# Patient Record
Sex: Female | Born: 1935 | Race: Black or African American | Hispanic: No | Smoking: Former smoker
Health system: Southern US, Community
[De-identification: ages and names within clinical notes are randomized; demographics above are authoritative.]

## PROBLEM LIST (undated history)

## (undated) DIAGNOSIS — E785 Hyperlipidemia, unspecified: Secondary | ICD-10-CM

## (undated) DIAGNOSIS — F329 Major depressive disorder, single episode, unspecified: Secondary | ICD-10-CM

## (undated) DIAGNOSIS — M810 Age-related osteoporosis without current pathological fracture: Secondary | ICD-10-CM

## (undated) DIAGNOSIS — I493 Ventricular premature depolarization: Secondary | ICD-10-CM

## (undated) DIAGNOSIS — R413 Other amnesia: Secondary | ICD-10-CM

## (undated) DIAGNOSIS — I82409 Acute embolism and thrombosis of unspecified deep veins of unspecified lower extremity: Secondary | ICD-10-CM

## (undated) DIAGNOSIS — I10 Essential (primary) hypertension: Secondary | ICD-10-CM

## (undated) DIAGNOSIS — F32A Depression, unspecified: Secondary | ICD-10-CM

## (undated) HISTORY — DX: Ventricular premature depolarization: I49.3

## (undated) HISTORY — DX: Hyperlipidemia, unspecified: E78.5

## (undated) HISTORY — DX: Age-related osteoporosis without current pathological fracture: M81.0

## (undated) HISTORY — DX: Other amnesia: R41.3

## (undated) HISTORY — DX: Acute embolism and thrombosis of unspecified deep veins of unspecified lower extremity: I82.409

## (undated) HISTORY — DX: Depression, unspecified: F32.A

## (undated) HISTORY — PX: PARTIAL HYSTERECTOMY: SHX80

---
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
## 2001-02-01 ENCOUNTER — Emergency Department (HOSPITAL_COMMUNITY): Admission: EM | Admit: 2001-02-01 | Discharge: 2001-02-01 | Payer: Self-pay | Admitting: Emergency Medicine

## 2001-02-01 ENCOUNTER — Encounter: Payer: Self-pay | Admitting: Emergency Medicine

## 2001-10-30 ENCOUNTER — Encounter: Admission: RE | Admit: 2001-10-30 | Discharge: 2001-10-30 | Payer: Self-pay | Admitting: Internal Medicine

## 2005-09-09 ENCOUNTER — Emergency Department (HOSPITAL_COMMUNITY): Admission: EM | Admit: 2005-09-09 | Discharge: 2005-09-09 | Payer: Self-pay | Admitting: Emergency Medicine

## 2005-09-30 ENCOUNTER — Emergency Department (HOSPITAL_COMMUNITY): Admission: EM | Admit: 2005-09-30 | Discharge: 2005-09-30 | Payer: Self-pay | Admitting: Family Medicine

## 2006-04-08 ENCOUNTER — Emergency Department (HOSPITAL_COMMUNITY): Admission: EM | Admit: 2006-04-08 | Discharge: 2006-04-08 | Payer: Self-pay | Admitting: Family Medicine

## 2006-05-20 ENCOUNTER — Emergency Department (HOSPITAL_COMMUNITY): Admission: EM | Admit: 2006-05-20 | Discharge: 2006-05-20 | Payer: Self-pay | Admitting: Emergency Medicine

## 2007-03-26 ENCOUNTER — Emergency Department (HOSPITAL_COMMUNITY): Admission: EM | Admit: 2007-03-26 | Discharge: 2007-03-26 | Payer: Self-pay | Admitting: Family Medicine

## 2007-11-27 ENCOUNTER — Emergency Department (HOSPITAL_COMMUNITY): Admission: EM | Admit: 2007-11-27 | Discharge: 2007-11-27 | Payer: Self-pay | Admitting: Emergency Medicine

## 2008-01-04 ENCOUNTER — Emergency Department (HOSPITAL_COMMUNITY): Admission: EM | Admit: 2008-01-04 | Discharge: 2008-01-04 | Payer: Self-pay | Admitting: Emergency Medicine

## 2008-09-07 ENCOUNTER — Encounter: Admission: RE | Admit: 2008-09-07 | Discharge: 2008-09-07 | Payer: Self-pay | Admitting: Internal Medicine

## 2009-09-08 ENCOUNTER — Encounter: Admission: RE | Admit: 2009-09-08 | Discharge: 2009-09-08 | Payer: Self-pay | Admitting: Internal Medicine

## 2009-10-01 ENCOUNTER — Emergency Department (HOSPITAL_COMMUNITY): Admission: EM | Admit: 2009-10-01 | Discharge: 2009-10-01 | Payer: Self-pay | Admitting: Emergency Medicine

## 2010-03-12 ENCOUNTER — Emergency Department (HOSPITAL_COMMUNITY): Admission: EM | Admit: 2010-03-12 | Discharge: 2010-03-12 | Payer: Self-pay | Admitting: Family Medicine

## 2010-09-09 ENCOUNTER — Ambulatory Visit (HOSPITAL_COMMUNITY): Admission: RE | Admit: 2010-09-09 | Discharge: 2010-09-09 | Payer: Self-pay | Admitting: Obstetrics

## 2010-10-02 ENCOUNTER — Emergency Department (HOSPITAL_COMMUNITY): Admission: EM | Admit: 2010-10-02 | Discharge: 2010-10-02 | Payer: Self-pay | Admitting: Emergency Medicine

## 2011-01-31 LAB — POCT URINALYSIS DIPSTICK
Nitrite: NEGATIVE
Urobilinogen, UA: 0.2 mg/dL (ref 0.0–1.0)

## 2011-01-31 LAB — POCT I-STAT, CHEM 8
Calcium, Ion: 1.13 mmol/L (ref 1.12–1.32)
Chloride: 100 mEq/L (ref 96–112)
Creatinine, Ser: 0.9 mg/dL (ref 0.4–1.2)
Hemoglobin: 15.6 g/dL — ABNORMAL HIGH (ref 12.0–15.0)
Potassium: 3.8 mEq/L (ref 3.5–5.1)
Sodium: 139 mEq/L (ref 135–145)

## 2011-01-31 LAB — GAMMA GT: GGT: 26 U/L (ref 7–51)

## 2011-01-31 LAB — AST: AST: 24 U/L (ref 0–37)

## 2011-01-31 LAB — BILIRUBIN, TOTAL: Total Bilirubin: 0.5 mg/dL (ref 0.3–1.2)

## 2011-01-31 LAB — ALT: ALT: 16 U/L (ref 0–35)

## 2011-02-07 LAB — DIFFERENTIAL
Basophils Relative: 1 % (ref 0–1)
Lymphs Abs: 2.3 10*3/uL (ref 0.7–4.0)

## 2011-02-07 LAB — POCT I-STAT, CHEM 8
Calcium, Ion: 1.15 mmol/L (ref 1.12–1.32)
HCT: 45 % (ref 36.0–46.0)
Potassium: 3.9 mEq/L (ref 3.5–5.1)
TCO2: 27 mmol/L (ref 0–100)

## 2011-02-07 LAB — CBC
Hemoglobin: 13.8 g/dL (ref 12.0–15.0)
RBC: 4.38 MIL/uL (ref 3.87–5.11)

## 2011-02-22 LAB — BASIC METABOLIC PANEL
BUN: 8 mg/dL (ref 6–23)
CO2: 28 mEq/L (ref 19–32)
Calcium: 9.5 mg/dL (ref 8.4–10.5)
Creatinine, Ser: 0.71 mg/dL (ref 0.4–1.2)
GFR calc Af Amer: >60 mL/min (ref >60)
GFR calc non Af Amer: >60 mL/min (ref >60)
Glucose, Bld: 87 mg/dL (ref 70–99)

## 2011-02-22 LAB — CBC
HCT: 40.2 % (ref 36.0–46.0)
MCV: 94.4 fL (ref 78.0–100.0)
RBC: 4.26 MIL/uL (ref 3.87–5.11)
WBC: 5.5 10*3/uL (ref 4.0–10.5)

## 2011-02-22 LAB — URINALYSIS, ROUTINE W REFLEX MICROSCOPIC
Bilirubin Urine: NEGATIVE
Hgb urine dipstick: NEGATIVE
Ketones, ur: NEGATIVE mg/dL
Specific Gravity, Urine: 1.011 (ref 1.005–1.030)
Urobilinogen, UA: 0.2 mg/dL (ref 0.0–1.0)
pH: 7.5 (ref 5.0–8.0)

## 2011-02-22 LAB — DIFFERENTIAL
Lymphs Abs: 2.3 10*3/uL (ref 0.7–4.0)
Neutro Abs: 2.7 10*3/uL (ref 1.7–7.7)
Neutrophils Relative %: 48 % (ref 43–77)

## 2011-06-28 ENCOUNTER — Other Ambulatory Visit: Payer: Self-pay | Admitting: Obstetrics

## 2011-08-14 ENCOUNTER — Other Ambulatory Visit (HOSPITAL_COMMUNITY): Payer: Self-pay | Admitting: Obstetrics

## 2011-08-14 DIAGNOSIS — Z1231 Encounter for screening mammogram for malignant neoplasm of breast: Secondary | ICD-10-CM

## 2011-09-13 ENCOUNTER — Ambulatory Visit (HOSPITAL_COMMUNITY): Payer: Medicaid Other | Attending: Obstetrics

## 2011-09-13 ENCOUNTER — Other Ambulatory Visit: Payer: Self-pay | Admitting: Obstetrics

## 2011-10-25 ENCOUNTER — Encounter: Payer: Self-pay | Admitting: Emergency Medicine

## 2011-10-25 ENCOUNTER — Emergency Department (INDEPENDENT_AMBULATORY_CARE_PROVIDER_SITE_OTHER)
Admission: EM | Admit: 2011-10-25 | Discharge: 2011-10-25 | Disposition: A | Discharge status: Home / Self Care | Payer: Medicare Other | Source: Home / Self Care | Attending: Emergency Medicine | Admitting: Emergency Medicine

## 2011-10-25 ENCOUNTER — Emergency Department (INDEPENDENT_AMBULATORY_CARE_PROVIDER_SITE_OTHER): Payer: Medicare Other

## 2011-10-25 DIAGNOSIS — S6000XA Contusion of unspecified finger without damage to nail, initial encounter: Secondary | ICD-10-CM

## 2011-10-25 DIAGNOSIS — S60051A Contusion of right little finger without damage to nail, initial encounter: Secondary | ICD-10-CM

## 2011-10-25 HISTORY — DX: Essential (primary) hypertension: I10

## 2011-10-25 MED ORDER — TETANUS-DIPHTH-ACELL PERTUSSIS 5-2.5-18.5 LF-MCG/0.5 IM SUSP
INTRAMUSCULAR | Status: AC
  Filled 2011-10-25: qty 0.5

## 2011-10-25 MED ORDER — TETANUS-DIPHTH-ACELL PERTUSSIS 5-2.5-18.5 LF-MCG/0.5 IM SUSP
0.5000 mL | Freq: Once | INTRAMUSCULAR | Status: AC
  Administered 2011-10-25: 0.5 mL via INTRAMUSCULAR

## 2011-10-25 NOTE — ED Notes (Signed)
Pt here with right hand pinky finger 2cm superficial laceration that occurred yesterday after getting finger slammed in door.denies numb/tinglingc/o sore to the touch and swelling

## 2011-10-25 NOTE — ED Provider Notes (Signed)
History     CSN: [id number] Arrival date & time: 10/25/2011  6:59 PM   First MD Initiated Contact with Patient 10/25/11 1716      Chief Complaint  Patient presents with  . Finger Injury    (Consider location/radiation/quality/duration/timing/severity/associated sxs/prior treatment) Patient is a 77 y.o. female presenting with hand pain.  Hand Pain This is a new problem. The current episode started yesterday (caught right 5th finger in car door ). The problem has not changed since onset.She has tried nothing for the symptoms.    Past Medical History  Diagnosis Date  . Hypertension     History reviewed. No pertinent past surgical history.  No family history on file.  History  Substance Use Topics  . Smoking status: Never Smoker   . Smokeless tobacco: Not on file  . Alcohol Use: No    OB History    Grav Para Term Preterm Abortions TAB SAB Ect Mult Living                  Review of Systems  Constitutional: Negative.   Musculoskeletal: Positive for joint swelling.  Skin: Positive for wound.    Allergies  Review of patient's allergies indicates no known allergies.  Home Medications  No current outpatient prescriptions on file.  BP 215/101  Pulse 68  Temp(Src) 98.8 F (37.1 C) (Oral)  Resp 18  SpO2 98%  Physical Exam  Constitutional: She appears well-developed and well-nourished.  Musculoskeletal: She exhibits tenderness.       Arms:   ED Course  Procedures (including critical care time)  Labs Reviewed - No data to display No results found.   No diagnosis found.    MDM  X-rays reviewed and report per radiologist.         Barkley Bruns, MD 10/25/11 [phone]

## 2011-12-19 ENCOUNTER — Emergency Department (INDEPENDENT_AMBULATORY_CARE_PROVIDER_SITE_OTHER)
Admission: EM | Admit: 2011-12-19 | Discharge: 2011-12-19 | Disposition: A | Discharge status: Home / Self Care | Payer: Medicare Other | Source: Home / Self Care | Attending: Family Medicine | Admitting: Family Medicine

## 2011-12-19 ENCOUNTER — Encounter (HOSPITAL_COMMUNITY): Payer: Self-pay | Admitting: Emergency Medicine

## 2011-12-19 DIAGNOSIS — I10 Essential (primary) hypertension: Secondary | ICD-10-CM | POA: Diagnosis not present

## 2011-12-19 MED ORDER — LISINOPRIL 20 MG PO TABS: 10.0000 mg | ORAL_TABLET | Freq: Every day | ORAL | Status: DC

## 2011-12-19 NOTE — ED Provider Notes (Signed)
History     CSN: [id number]  Arrival date & time 12/19/11  1629   First MD Initiated Contact with Patient 12/19/11 1706      Chief Complaint  Patient presents with  . Hypertension    (Consider location/radiation/quality/duration/timing/severity/associated sxs/prior treatment) HPI Comments: The patient reports she has known hypertension and has not been taking her medication because she ran out 2 days ago.  Today she checked her blood pressure at the drug store and found that it was 178/100.  States she feels the same today and she always does - states she has some mild chest discomfort that is constant and exacerbated by palpation.  States she has daily SOB that is unchanged.  The CP and SOB occur only at rest and are not exertional.  Are the same every day. Denies headache.  States she did not get her medication refilled because she doesn't like the way that it makes her feel - states she feels like her whole body is numb and like her blood is not pumping.  Her doctor has referred her to a new doctor to start her on a new medication.  Previously on lisinopril-HCTZ   Patient is a 78 y.o. female presenting with hypertension. The history is provided by the patient.  Hypertension    Past Medical History  Diagnosis Date  . Hypertension     No past surgical history on file.  No family history on file.  History  Substance Use Topics  . Smoking status: Never Smoker   . Smokeless tobacco: Not on file  . Alcohol Use: No    OB History    Grav Para Term Preterm Abortions TAB SAB Ect Mult Living                  Review of Systems  All other systems reviewed and are negative.    Allergies  Review of patient's allergies indicates no known allergies.  Home Medications   Current Outpatient Rx  Name Route Sig Dispense Refill  . LISINOPRIL-HYDROCHLOROTHIAZIDE 10-12.5 MG PO TABS Oral Take 1 tablet by mouth daily.      BP 148/83  Pulse 88  Temp(Src) 97.9 F (36.6 C) (Oral)   Resp 18  SpO2 98%  Physical Exam  Nursing note and vitals reviewed. Constitutional: She is oriented to person, place, and time. She appears well-developed and well-nourished. No distress.  HENT:  Head: Normocephalic and atraumatic.  Neck: Neck supple.  Cardiovascular: Normal rate, regular rhythm and normal heart sounds.   No murmur heard. Pulmonary/Chest: Effort normal and breath sounds normal. No respiratory distress. She has no wheezes. She has no rales. She exhibits no tenderness.  Abdominal: Soft. Bowel sounds are normal. She exhibits no distension. There is no tenderness. There is no rebound and no guarding.  Musculoskeletal: Normal range of motion.  Neurological: She is alert and oriented to person, place, and time. She has normal strength. No cranial nerve deficit or sensory deficit. She exhibits normal muscle tone. Coordination and gait normal. GCS eye subscore is 4. GCS verbal subscore is 5. GCS motor subscore is 6.       CN II-XII intact, EOMs intact, no pronator drift, grip strengths equal, strength 5/5 in all extremities, sensation intact, gait is normal.    Skin: She is not diaphoretic.    ED Course  Procedures (including critical care time)  Labs Reviewed - No data to display No results found.   1. Hypertension       MDM  Patient with known hypertension, stopped her medication 2 days ago both because she ran out and because she didn't like the way it made her feel.  Today checked her blood pressure as she normally does at the drug store and saw that it was high - came to urgent care at PCP's direction because she was concerned about it being high.  BP here is 144/86.  Patient seems to have some chronic daily chest soreness and shortness of breath that are not different today.  I doubt ACS because the chest tenderness is reproducible by palpation, occurs only at rest, is not exacerbated by exertion, and is unchanged from her normal chronic daily discomfort.  The shortness  of breath also only occurs at rest and is a daily occurrence - patient currently denies SOB.  Patient has no neurological deficits and no altered mental status.  I do not believe she is having a hypertensive crisis.  I discussed the medications with the patient at length - she does not want to fill her old prescription but agrees instead to fill my prescription for one medication instead of the combination to see if she tolerates it.  Patient has multiple doctors that she follows with, organized by Dr Francoise Ceo - patient states she has appointments in progress to follow up for her HTN.          Dillard Cannon [place], [place] 12/19/11 1857

## 2011-12-19 NOTE — ED Notes (Signed)
Reports taking blood pressure while at [facility] drug store with a blood pressure reading of 178/100.  Patient reports feeling generalized weakness, intermittent, sometimes experiences some sob.  Patient reports these symptoms for 6-7 months.  Patient reports having had the flu  approximately 2 months ago.  Patient concerned with fluctuation of blood pressure.

## 2011-12-20 NOTE — ED Provider Notes (Signed)
Medical screening examination/treatment/procedure(s) were performed by non-physician practitioner and as supervising physician I was immediately available for consultation/collaboration.   [facility]; MD   Sharin Grave, MD 12/20/11 [phone]

## 2011-12-25 DIAGNOSIS — R0989 Other specified symptoms and signs involving the circulatory and respiratory systems: Secondary | ICD-10-CM | POA: Diagnosis not present

## 2011-12-25 DIAGNOSIS — I10 Essential (primary) hypertension: Secondary | ICD-10-CM | POA: Diagnosis not present

## 2011-12-25 DIAGNOSIS — R079 Chest pain, unspecified: Secondary | ICD-10-CM | POA: Diagnosis not present

## 2011-12-25 DIAGNOSIS — R9431 Abnormal electrocardiogram [ECG] [EKG]: Secondary | ICD-10-CM | POA: Diagnosis not present

## 2012-01-04 DIAGNOSIS — I10 Essential (primary) hypertension: Secondary | ICD-10-CM | POA: Diagnosis not present

## 2012-01-04 DIAGNOSIS — R0609 Other forms of dyspnea: Secondary | ICD-10-CM | POA: Diagnosis not present

## 2012-01-04 DIAGNOSIS — R9431 Abnormal electrocardiogram [ECG] [EKG]: Secondary | ICD-10-CM | POA: Diagnosis not present

## 2012-01-04 DIAGNOSIS — R079 Chest pain, unspecified: Secondary | ICD-10-CM | POA: Diagnosis not present

## 2012-01-05 ENCOUNTER — Encounter (INDEPENDENT_AMBULATORY_CARE_PROVIDER_SITE_OTHER): Payer: Medicare Other | Admitting: Ophthalmology

## 2012-01-05 DIAGNOSIS — H2510 Age-related nuclear cataract, unspecified eye: Secondary | ICD-10-CM | POA: Diagnosis not present

## 2012-01-05 DIAGNOSIS — H43819 Vitreous degeneration, unspecified eye: Secondary | ICD-10-CM | POA: Diagnosis not present

## 2012-01-05 DIAGNOSIS — H35039 Hypertensive retinopathy, unspecified eye: Secondary | ICD-10-CM | POA: Diagnosis not present

## 2012-01-05 DIAGNOSIS — I10 Essential (primary) hypertension: Secondary | ICD-10-CM

## 2012-01-10 DIAGNOSIS — R0989 Other specified symptoms and signs involving the circulatory and respiratory systems: Secondary | ICD-10-CM | POA: Diagnosis not present

## 2012-01-10 DIAGNOSIS — R9431 Abnormal electrocardiogram [ECG] [EKG]: Secondary | ICD-10-CM | POA: Diagnosis not present

## 2012-01-10 DIAGNOSIS — H53129 Transient visual loss, unspecified eye: Secondary | ICD-10-CM | POA: Diagnosis not present

## 2012-01-10 DIAGNOSIS — I10 Essential (primary) hypertension: Secondary | ICD-10-CM | POA: Diagnosis not present

## 2012-01-10 DIAGNOSIS — R0602 Shortness of breath: Secondary | ICD-10-CM | POA: Diagnosis not present

## 2012-01-10 DIAGNOSIS — R079 Chest pain, unspecified: Secondary | ICD-10-CM | POA: Diagnosis not present

## 2012-01-10 DIAGNOSIS — R0609 Other forms of dyspnea: Secondary | ICD-10-CM | POA: Diagnosis not present

## 2012-01-18 DIAGNOSIS — R0602 Shortness of breath: Secondary | ICD-10-CM | POA: Diagnosis not present

## 2012-01-18 DIAGNOSIS — R079 Chest pain, unspecified: Secondary | ICD-10-CM | POA: Diagnosis not present

## 2012-01-18 DIAGNOSIS — I10 Essential (primary) hypertension: Secondary | ICD-10-CM | POA: Diagnosis not present

## 2012-01-26 DIAGNOSIS — R9431 Abnormal electrocardiogram [ECG] [EKG]: Secondary | ICD-10-CM | POA: Diagnosis not present

## 2012-01-26 DIAGNOSIS — I10 Essential (primary) hypertension: Secondary | ICD-10-CM | POA: Diagnosis not present

## 2012-01-26 DIAGNOSIS — R079 Chest pain, unspecified: Secondary | ICD-10-CM | POA: Diagnosis not present

## 2012-02-01 DIAGNOSIS — R9431 Abnormal electrocardiogram [ECG] [EKG]: Secondary | ICD-10-CM | POA: Diagnosis not present

## 2012-02-01 DIAGNOSIS — I10 Essential (primary) hypertension: Secondary | ICD-10-CM | POA: Diagnosis not present

## 2012-02-01 DIAGNOSIS — R0609 Other forms of dyspnea: Secondary | ICD-10-CM | POA: Diagnosis not present

## 2012-02-01 DIAGNOSIS — R079 Chest pain, unspecified: Secondary | ICD-10-CM | POA: Diagnosis not present

## 2012-02-13 DIAGNOSIS — I10 Essential (primary) hypertension: Secondary | ICD-10-CM | POA: Diagnosis not present

## 2012-08-15 DIAGNOSIS — I10 Essential (primary) hypertension: Secondary | ICD-10-CM | POA: Diagnosis not present

## 2012-08-15 DIAGNOSIS — R634 Abnormal weight loss: Secondary | ICD-10-CM | POA: Diagnosis not present

## 2012-10-16 DIAGNOSIS — R0989 Other specified symptoms and signs involving the circulatory and respiratory systems: Secondary | ICD-10-CM | POA: Diagnosis not present

## 2012-10-16 DIAGNOSIS — E7800 Pure hypercholesterolemia, unspecified: Secondary | ICD-10-CM | POA: Diagnosis not present

## 2012-10-16 DIAGNOSIS — I10 Essential (primary) hypertension: Secondary | ICD-10-CM | POA: Diagnosis not present

## 2012-10-23 DIAGNOSIS — I10 Essential (primary) hypertension: Secondary | ICD-10-CM | POA: Diagnosis not present

## 2012-10-29 DIAGNOSIS — I10 Essential (primary) hypertension: Secondary | ICD-10-CM | POA: Diagnosis not present

## 2012-11-05 DIAGNOSIS — F0390 Unspecified dementia without behavioral disturbance: Secondary | ICD-10-CM | POA: Diagnosis not present

## 2012-11-05 DIAGNOSIS — I10 Essential (primary) hypertension: Secondary | ICD-10-CM | POA: Diagnosis not present

## 2013-02-26 DIAGNOSIS — I10 Essential (primary) hypertension: Secondary | ICD-10-CM | POA: Diagnosis not present

## 2013-02-26 DIAGNOSIS — E7800 Pure hypercholesterolemia, unspecified: Secondary | ICD-10-CM | POA: Diagnosis not present

## 2013-02-28 DIAGNOSIS — F0390 Unspecified dementia without behavioral disturbance: Secondary | ICD-10-CM | POA: Diagnosis not present

## 2013-02-28 DIAGNOSIS — I10 Essential (primary) hypertension: Secondary | ICD-10-CM | POA: Diagnosis not present

## 2013-05-06 DIAGNOSIS — I10 Essential (primary) hypertension: Secondary | ICD-10-CM | POA: Diagnosis not present

## 2013-07-27 ENCOUNTER — Emergency Department (INDEPENDENT_AMBULATORY_CARE_PROVIDER_SITE_OTHER)
Admission: EM | Admit: 2013-07-27 | Discharge: 2013-07-27 | Disposition: A | Discharge status: Home / Self Care | Payer: Medicare Other | Source: Home / Self Care | Attending: Family Medicine | Admitting: Family Medicine

## 2013-07-27 ENCOUNTER — Encounter (HOSPITAL_COMMUNITY): Payer: Self-pay | Admitting: *Deleted

## 2013-07-27 DIAGNOSIS — R040 Epistaxis: Secondary | ICD-10-CM

## 2013-07-27 DIAGNOSIS — I10 Essential (primary) hypertension: Secondary | ICD-10-CM

## 2013-07-27 LAB — POCT I-STAT, CHEM 8
BUN: 10 mg/dL (ref 6–23)
Chloride: 107 mEq/L (ref 96–112)
Potassium: 3.8 mEq/L (ref 3.5–5.1)
Sodium: 141 mEq/L (ref 135–145)
TCO2: 22 mmol/L (ref 0–100)

## 2013-07-27 NOTE — ED Provider Notes (Signed)
CSN: [id number]     Arrival date & time 07/27/13  4098 History   First MD Initiated Contact with Patient 07/27/13 [phone]     Chief Complaint  Patient presents with  . Dizziness  . Epistaxis  . Headache   (Consider location/radiation/quality/duration/timing/severity/associated sxs/prior Treatment) Patient is a 79 y.o. female presenting with nosebleeds. The history is provided by the patient and a relative.  Epistaxis Location:  L nare Severity:  Mild Duration:  1 day Timing:  Intermittent Progression:  Improving Chronicity:  New Context: aspirin use   Associated symptoms: congestion, dizziness and headaches   Associated symptoms comment:  Weakness for 1 month.   Past Medical History  Diagnosis Date  . Hypertension    History reviewed. No pertinent past surgical history. No family history on file. History  Substance Use Topics  . Smoking status: Former Games developer  . Smokeless tobacco: Not on file  . Alcohol Use: No   OB History   Grav Para Term Preterm Abortions TAB SAB Ect Mult Living                 Review of Systems  Constitutional: Negative.   HENT: Positive for nosebleeds, congestion and rhinorrhea.   Respiratory: Negative.   Cardiovascular: Negative.   Neurological: Positive for dizziness and headaches.    Allergies  Review of patient's allergies indicates no known allergies.  Home Medications   Current Outpatient Rx  Name  Route  Sig  Dispense  Refill  . amLODipine (NORVASC) 10 MG tablet   Oral   Take 10 mg by mouth daily.         Marland Kitchen aspirin 81 MG tablet   Oral   Take 81 mg by mouth daily.         Marland Kitchen ibuprofen (ADVIL,MOTRIN) 800 MG tablet   Oral   Take 800 mg by mouth every 8 (eight) hours as needed for pain.         . valsartan-hydrochlorothiazide (DIOVAN-HCT) 320-12.5 MG per tablet   Oral   Take 1 tablet by mouth daily.         Marland Kitchen EXPIRED: lisinopril (PRINIVIL,ZESTRIL) 20 MG tablet   Oral   Take 0.5 tablets (10 mg total) by mouth daily.  30 tablet   0   . lisinopril-hydrochlorothiazide (PRINZIDE,ZESTORETIC) 10-12.5 MG per tablet   Oral   Take 1 tablet by mouth daily.          BP 161/92  Pulse 83  Temp(Src) 98.6 F (37 C) (Oral)  Resp 17  SpO2 95% Physical Exam  Nursing note and vitals reviewed. Constitutional: She is oriented to person, place, and time. She appears well-developed and well-nourished.  HENT:  Head: Normocephalic.  Right Ear: External ear normal.  Left Ear: External ear normal.  Nose: Epistaxis is observed.    Mouth/Throat: Oropharynx is clear and moist.  Eyes: Pupils are equal, round, and reactive to light.  Neck: Normal range of motion. Neck supple.  Cardiovascular: Normal rate.   Pulmonary/Chest: Effort normal and breath sounds normal.  Musculoskeletal: She exhibits no edema.  Neurological: She is alert and oriented to person, place, and time.  Skin: Skin is warm and dry.    ED Course  Procedures (including critical care time) Labs Review Labs Reviewed  POCT I-STAT, CHEM 8   Imaging Review No results found.  MDM  Afrin and bacitracin applied to left nares.    Linna Hoff, MD 07/27/13 1022

## 2013-07-27 NOTE — ED Notes (Addendum)
Triage assessment performed per Dr. Artis Flock.  States has not taken HTN meds today.

## 2013-08-06 DIAGNOSIS — I10 Essential (primary) hypertension: Secondary | ICD-10-CM | POA: Diagnosis not present

## 2013-08-06 DIAGNOSIS — Z23 Encounter for immunization: Secondary | ICD-10-CM | POA: Diagnosis not present

## 2013-08-13 ENCOUNTER — Emergency Department (INDEPENDENT_AMBULATORY_CARE_PROVIDER_SITE_OTHER)
Admission: EM | Admit: 2013-08-13 | Discharge: 2013-08-13 | Disposition: A | Discharge status: Home / Self Care | Payer: Medicare Other | Source: Home / Self Care | Attending: Family Medicine | Admitting: Family Medicine

## 2013-08-13 ENCOUNTER — Encounter (HOSPITAL_COMMUNITY): Payer: Self-pay | Admitting: Emergency Medicine

## 2013-08-13 DIAGNOSIS — R5381 Other malaise: Secondary | ICD-10-CM | POA: Diagnosis not present

## 2013-08-13 DIAGNOSIS — R531 Weakness: Secondary | ICD-10-CM

## 2013-08-13 NOTE — ED Notes (Signed)
Pt c/o headache onset this morning. Pt reports she had recent dental surgery and doesn't know if pain was related to that. Has not taken any pain meds today. Reports when she takes them she feels dizzy and like she is drunk. Pt is alert and oriented.

## 2013-08-13 NOTE — ED Provider Notes (Signed)
CSN: [id number]     Arrival date & time 08/13/13  1515 History   First MD Initiated Contact with Patient 08/13/13 1539     Chief Complaint  Patient presents with  . Headache   (Consider location/radiation/quality/duration/timing/severity/associated sxs/prior Treatment) Patient is a 79 y.o. female presenting with headaches. The history is provided by the patient.  Headache Pain location:  Generalized Radiates to:  Does not radiate Progression:  Unchanged Chronicity:  New Similar to prior headaches: no   Context comment:  Post dental extractions, taking abx and pain med, causing side effect. Associated symptoms: no fever     Past Medical History  Diagnosis Date  . Hypertension    History reviewed. No pertinent past surgical history. No family history on file. History  Substance Use Topics  . Smoking status: Former Games developer  . Smokeless tobacco: Not on file  . Alcohol Use: No   OB History   Grav Para Term Preterm Abortions TAB SAB Ect Mult Living                 Review of Systems  Constitutional: Positive for appetite change. Negative for fever and chills.  Respiratory: Negative for chest tightness and shortness of breath.   Gastrointestinal: Negative.   Neurological: Positive for headaches.    Allergies  Review of patient's allergies indicates no known allergies.  Home Medications   Current Outpatient Rx  Name  Route  Sig  Dispense  Refill  . amLODipine (NORVASC) 10 MG tablet   Oral   Take 10 mg by mouth daily.         Marland Kitchen aspirin 81 MG tablet   Oral   Take 81 mg by mouth daily.         Marland Kitchen ibuprofen (ADVIL,MOTRIN) 800 MG tablet   Oral   Take 800 mg by mouth every 8 (eight) hours as needed for pain.         Marland Kitchen EXPIRED: lisinopril (PRINIVIL,ZESTRIL) 20 MG tablet   Oral   Take 0.5 tablets (10 mg total) by mouth daily.   30 tablet   0   . lisinopril-hydrochlorothiazide (PRINZIDE,ZESTORETIC) 10-12.5 MG per tablet   Oral   Take 1 tablet by mouth daily.        . valsartan-hydrochlorothiazide (DIOVAN-HCT) 320-12.5 MG per tablet   Oral   Take 1 tablet by mouth daily.          BP 121/75  Pulse 74  Temp(Src) 98.4 F (36.9 C) (Oral)  Resp 20  SpO2 97% Physical Exam  Nursing note and vitals reviewed. Constitutional: She is oriented to person, place, and time. She appears well-developed and well-nourished.  HENT:  Right Ear: External ear normal.  Left Ear: External ear normal.  Mouth/Throat: Oropharynx is clear and moist.  Healing dental gingiva extraction sites.  Eyes: EOM are normal. Pupils are equal, round, and reactive to light.  Neck: Normal range of motion. Neck supple.  Cardiovascular: Normal rate, regular rhythm and intact distal pulses.   Pulmonary/Chest: Effort normal and breath sounds normal.  Musculoskeletal: She exhibits no edema.  Neurological: She is alert and oriented to person, place, and time.  Skin: Skin is warm and dry.    ED Course  Procedures (including critical care time) Labs Review Labs Reviewed - No data to display Imaging Review No results found.  MDM   1. Weakness generalized      Linna Hoff, MD 08/13/13 [phone]

## 2013-08-22 ENCOUNTER — Emergency Department (INDEPENDENT_AMBULATORY_CARE_PROVIDER_SITE_OTHER)
Admission: EM | Admit: 2013-08-22 | Discharge: 2013-08-22 | Disposition: A | Discharge status: Home / Self Care | Payer: Medicare Other | Source: Home / Self Care

## 2013-08-22 ENCOUNTER — Encounter (HOSPITAL_COMMUNITY): Payer: Self-pay

## 2013-08-22 DIAGNOSIS — R638 Other symptoms and signs concerning food and fluid intake: Secondary | ICD-10-CM

## 2013-08-22 DIAGNOSIS — R42 Dizziness and giddiness: Secondary | ICD-10-CM | POA: Diagnosis not present

## 2013-08-22 DIAGNOSIS — K5900 Constipation, unspecified: Secondary | ICD-10-CM

## 2013-08-22 LAB — POCT I-STAT, CHEM 8
BUN: 20 mg/dL (ref 6–23)
Creatinine, Ser: 1.1 mg/dL (ref 0.50–1.10)
Hemoglobin: 13.9 g/dL (ref 12.0–15.0)
Potassium: 4 mEq/L (ref 3.5–5.1)
Sodium: 138 mEq/L (ref 135–145)

## 2013-08-22 LAB — POCT URINALYSIS DIP (DEVICE)
Bilirubin Urine: NEGATIVE
Ketones, ur: NEGATIVE mg/dL
Leukocytes, UA: NEGATIVE
pH: 6 (ref 5.0–8.0)

## 2013-08-22 MED ORDER — MECLIZINE HCL 25 MG PO TABS: ORAL_TABLET | ORAL | Status: DC

## 2013-08-22 NOTE — ED Provider Notes (Signed)
CSN: [id number]     Arrival date & time 08/22/13  1343 History   None    Chief Complaint  Patient presents with  . Dizziness   (Consider location/radiation/quality/duration/timing/severity/associated sxs/prior Treatment) HPI Comments: 79 year old female had dental work several weeks ago in which she had teeth removed and as a result was placed on amoxicillin and Norco. In addition she is taking Diovan HCT 320/12.5. and amlodipine 10 mg in the morning for blood pressure. She is complaining of dizziness particularly upon awakening in the morning and sometimes around 12:00 noon. Dizziness is also induced, on occasion when lying supine or sitting up. Associated symptoms are occasional blurred vision and nausea. She states due to her mouth pain she has not been eating or drinking as she usually does. She is complaining of abdominal discomfort as well. Her bowel movements are once every 4-5 days. For pain takes norco prn but not necessarily daily, but may take 2 a day for mouth pain flare ups.   Past Medical History  Diagnosis Date  . Hypertension    History reviewed. No pertinent past surgical history. History reviewed. No pertinent family history. History  Substance Use Topics  . Smoking status: Former Games developer  . Smokeless tobacco: Not on file  . Alcohol Use: No   OB History   Grav Para Term Preterm Abortions TAB SAB Ect Mult Living                 Review of Systems  Constitutional: Positive for activity change and appetite change. Negative for fever and fatigue.  HENT: Negative for sore throat, facial swelling, trouble swallowing and neck pain.        Oral pain  Respiratory: Negative.   Cardiovascular: Positive for chest pain.  Gastrointestinal: Positive for nausea, abdominal pain and constipation. Negative for vomiting.  Genitourinary: Negative for frequency and pelvic pain.       Malodorous urine  Musculoskeletal: Negative.   Skin: Negative for rash.  Neurological: Positive for  dizziness, weakness and light-headedness. Negative for tremors, syncope and speech difficulty.    Allergies  Review of patient's allergies indicates no known allergies.  Home Medications   Current Outpatient Rx  Name  Route  Sig  Dispense  Refill  . HYDROcodone-acetaminophen (NORCO/VICODIN) 5-325 MG per tablet   Oral   Take 1 tablet by mouth every 6 (six) hours as needed for pain.         Marland Kitchen amLODipine (NORVASC) 10 MG tablet   Oral   Take 10 mg by mouth daily.         Marland Kitchen aspirin 81 MG tablet   Oral   Take 81 mg by mouth daily.         Marland Kitchen ibuprofen (ADVIL,MOTRIN) 800 MG tablet   Oral   Take 800 mg by mouth every 8 (eight) hours as needed for pain.         Marland Kitchen EXPIRED: lisinopril (PRINIVIL,ZESTRIL) 20 MG tablet   Oral   Take 0.5 tablets (10 mg total) by mouth daily.   30 tablet   0   . lisinopril-hydrochlorothiazide (PRINZIDE,ZESTORETIC) 10-12.5 MG per tablet   Oral   Take 1 tablet by mouth daily.         . meclizine (ANTIVERT) 25 MG tablet      1/2 tablet every 6 to 8 hours prn dizziness. Take with plenty of water.   15 tablet   0   . valsartan-hydrochlorothiazide (DIOVAN-HCT) 320-12.5 MG per tablet   Oral  Take 1 tablet by mouth daily.          BP 133/81  Pulse 75  Temp(Src) 98.3 F (36.8 C) (Oral)  Resp 12  SpO2 99% Physical Exam  Nursing note and vitals reviewed. Constitutional: She is oriented to person, place, and time. No distress.  Overall the patient appears generally well but thin  HENT:  Head: Normocephalic and atraumatic.  Mouth/Throat: No oropharyngeal exudate.  Eyes: Conjunctivae and EOM are normal.  Neck: Normal range of motion. Neck supple.  Cardiovascular: Normal rate, regular rhythm and normal heart sounds.   Pulmonary/Chest: Effort normal and breath sounds normal. No respiratory distress.  Chest wall tenderness, esp along the bilateral costal margins and para sternal margins  Abdominal: Soft. Bowel sounds are normal. She  exhibits no distension. There is tenderness.  Musculoskeletal: She exhibits tenderness. She exhibits no edema.  Lymphadenopathy:    She has no cervical adenopathy.  Neurological: She is alert and oriented to person, place, and time. She exhibits normal muscle tone.  Skin: Skin is warm and dry.  Psychiatric: She has a normal mood and affect.    ED Course  Procedures (including critical care time) Labs Review Labs Reviewed  POCT I-STAT, CHEM 8  POCT URINALYSIS DIP (DEVICE)   Imaging Review No results found. Results for orders placed during the hospital encounter of 08/22/13  POCT I-STAT, CHEM 8      Result Value Range   Sodium 138  135 - 145 mEq/L   Potassium 4.0  3.5 - 5.1 mEq/L   Chloride 101  96 - 112 mEq/L   BUN 20  6 - 23 mg/dL   Creatinine, Ser 6.29  0.50 - 1.10 mg/dL   Glucose, Bld 88  70 - 99 mg/dL   Calcium, Ion 5.28  4.13 - 1.30 mmol/L   TCO2 27  0 - 100 mmol/L   Hemoglobin 13.9  12.0 - 15.0 g/dL   HCT 24.4  01.0 - 27.2 %  POCT URINALYSIS DIP (DEVICE)      Result Value Range   Glucose, UA NEGATIVE  NEGATIVE mg/dL   Bilirubin Urine NEGATIVE  NEGATIVE   Ketones, ur NEGATIVE  NEGATIVE mg/dL   Specific Gravity, Urine 1.025  1.005 - 1.030   Hgb urine dipstick NEGATIVE  NEGATIVE   pH 6.0  5.0 - 8.0   Protein, ur NEGATIVE  NEGATIVE mg/dL   Urobilinogen, UA 0.2  0.0 - 1.0 mg/dL   Nitrite NEGATIVE  NEGATIVE   Leukocytes, UA NEGATIVE  NEGATIVE    MDM   1. Dizziness   2. Constipation   3. Poor fluid intake      Unable to identify a single cause for her dizziness. I suspect is a combination of poor by mouth intake, constipation, possibly a medication side effect and inner ear disorder.  Her i-STAT and urinalysis are within normal limits do not reflect dehydration. She does not have postural hypotension. Recommend 12.5 mg of Antivert every 6-8 hours as needed for dizziness. Also if having dizziness did not dry. Talk to her doctor about your blood pressure medicine as  a potential contributing calls him for followup. Senokot tablets as directed for constipation and drink plenty of fluids. For any worsening new symptoms or problems may return or go to the emergency department. Patient is discharged in a stable condition and is asymptomatic at this time.  Hayden Rasmussen, NP 08/22/13 1614  Hayden Rasmussen, NP 08/22/13 [phone]

## 2013-08-22 NOTE — ED Notes (Addendum)
States she feels dizzy , and has not felt herself since recent dental surgery(2 days) ; c/o she feels weak and dizzy (still taking Vicodin for pain; still has sutures in place) drove herself to [facility]

## 2013-08-23 NOTE — ED Provider Notes (Signed)
Medical screening examination/treatment/procedure(s) were performed by resident physician or non-physician practitioner and as supervising physician I was immediately available for consultation/collaboration.   Barkley Bruns MD.   Linna Hoff, MD 08/23/13 [phone]

## 2013-10-07 DIAGNOSIS — H04129 Dry eye syndrome of unspecified lacrimal gland: Secondary | ICD-10-CM | POA: Diagnosis not present

## 2013-10-08 DIAGNOSIS — R21 Rash and other nonspecific skin eruption: Secondary | ICD-10-CM | POA: Diagnosis not present

## 2013-10-15 DIAGNOSIS — H2510 Age-related nuclear cataract, unspecified eye: Secondary | ICD-10-CM | POA: Diagnosis not present

## 2013-10-15 DIAGNOSIS — H26019 Infantile and juvenile cortical, lamellar, or zonular cataract, unspecified eye: Secondary | ICD-10-CM | POA: Diagnosis not present

## 2013-10-25 ENCOUNTER — Encounter (HOSPITAL_COMMUNITY): Payer: Self-pay | Admitting: Emergency Medicine

## 2013-10-25 ENCOUNTER — Emergency Department (INDEPENDENT_AMBULATORY_CARE_PROVIDER_SITE_OTHER)
Admission: EM | Admit: 2013-10-25 | Discharge: 2013-10-25 | Disposition: A | Discharge status: Home / Self Care | Payer: Medicare Other | Source: Home / Self Care | Attending: Emergency Medicine | Admitting: Emergency Medicine

## 2013-10-25 DIAGNOSIS — R040 Epistaxis: Secondary | ICD-10-CM | POA: Diagnosis not present

## 2013-10-25 MED ORDER — MUPIROCIN 2 % EX OINT: 1.0000 "application " | TOPICAL_OINTMENT | Freq: Three times a day (TID) | CUTANEOUS | Status: DC

## 2013-10-25 MED ORDER — TETRACAINE HCL 0.5 % OP SOLN
OPHTHALMIC | Status: AC
  Filled 2013-10-25: qty 2

## 2013-10-25 NOTE — ED Provider Notes (Signed)
Chief Complaint:   Chief Complaint  Patient presents with  . Epistaxis    History of Present Illness:   Amanda [name] is a 80 year old female with hypertension who has a two-day history of nosebleeds from the left nostril. She's had difficulty in getting it stopped. She blows out clots from her nose and spit some of her mouth. It's take as long as 30 minutes to get the bleeding to stop. When she comes in right now the bleeding has completely stopped, although she only got stopped a few minutes ago. She denies any injury to the nose, nasal congestion, rhinorrhea, sneezing, allergies, or URI symptoms. She takes a baby aspirin a day but no other blood thinners.  Review of Systems:  Other than noted above, the patient denies any of the following symptoms: Systemic:  No fevers, chills, sweats, weight loss or gain, fatigue, or tiredness. Eye:  No redness, pain, discharge, itching, blurred vision, or diplopia. ENT:  No headache, nasal congestion, sneezing, itching, epistaxis, ear pain, congestion, decreased hearing, ringing in ears, vertigo, or tinnitus.  No oral lesions, sore throat, pain on swallowing, or hoarseness. Neck:  No mass, tenderness or adenopathy. Lungs:  No coughing, wheezing, or shortness of breath. Skin:  No rash or itching.  PMFSH:  Past medical history, family history, social history, meds, and allergies were reviewed. She is on Norvasc, ibuprofen, Diovan/hydrochlorothiazide, and aspirin. She is a former smoker.  Physical Exam:   Vital signs:  BP 155/94  Pulse 65  Temp(Src) 98.2 F (36.8 C) (Oral)  Resp 14  SpO2 100% General:  Alert and oriented.  In no distress.  Skin warm and dry. Eye:  PERRL, full EOMs, lids and conjunctiva normal.   ENT:  TMs and canals clear.  Nasal mucosa not congested and without drainage. There was no bleeding from the left nostril. She had a large, dilated blood vessel on the anterior septum. Mucous membranes moist, no oral lesions, normal dentition,  pharynx clear.  No cranial or facial pain to palplation. Neck:  Supple, full ROM.  No adenopathy, tenderness or mass.  Thyroid normal. Lungs:  Breath sounds clear and equal bilaterally.  No wheezes, rales or rhonchi. Heart:  Rhythm regular, without extrasystoles.  No gallops or murmers. Skin:  Clear, warm and dry.   Procedure Note:  Verbal informed consent was obtained from the patient.  Risks and benefits were outlined with the patient.  Patient understands and accepts these risks.  Identity of the patient was confirmed verbally and by armband.    Procedure was performed as follows:  The nasal mucosa was anesthetized with tetracaine solution, placed on a cotton ball. Thereafter the dilated blood vessel was cauterized with silver nitrate. Bacitracin ointment was applied. The patient was warned not to pick at the nose, rub the nose, or blow the nose for the next 5 days.  Patient tolerated the procedure well without any immediate complications.   Assessment:  The encounter diagnosis was Epistaxis.  This is a clear-cut anterior epistaxis, and should get better with cauterization.  Plan:   1.  Meds:  The following meds were prescribed:   Discharge Medication List as of 10/25/2013  7:13 PM    START taking these medications   Details  mupirocin ointment (BACTROBAN) 2 % Apply 1 application topically 3 (three) times daily., Starting 10/25/2013, Until Discontinued, Normal        2.  Patient Education/Counseling:  The patient was given appropriate handouts, self care instructions, and instructed in symptomatic relief.  She will apply antibiotic ointment 3 times a day and use saline spray and vitamin C.  3.  Follow up:  The patient was told to follow up if no better in 3 to 4 days, if becoming worse in any way, and given some red flag symptoms such as recurrent bleeding which would prompt immediate return.  Follow up with Dr. Suszanne Conners if bleeding continues.     Reuben Likes, MD 10/25/13 [phone]

## 2013-10-25 NOTE — ED Notes (Signed)
Reports having nosebleed yesterday that lasted about 30 mins.  Today another episode this evening around 6:15 p.m that has lasted for about 30 mins.  Hx of hypertension.  States meds as prescribed. Denies any other symptoms.

## 2013-10-25 NOTE — ED Notes (Signed)
Daughters states bleeding from nose yesterday.  Today pt had bleeding from nose and mouth today.

## 2013-10-29 DIAGNOSIS — H2510 Age-related nuclear cataract, unspecified eye: Secondary | ICD-10-CM | POA: Diagnosis not present

## 2013-10-29 DIAGNOSIS — H26019 Infantile and juvenile cortical, lamellar, or zonular cataract, unspecified eye: Secondary | ICD-10-CM | POA: Diagnosis not present

## 2013-11-05 DIAGNOSIS — H26019 Infantile and juvenile cortical, lamellar, or zonular cataract, unspecified eye: Secondary | ICD-10-CM | POA: Diagnosis not present

## 2013-11-05 DIAGNOSIS — H2510 Age-related nuclear cataract, unspecified eye: Secondary | ICD-10-CM | POA: Diagnosis not present

## 2014-01-20 DIAGNOSIS — I10 Essential (primary) hypertension: Secondary | ICD-10-CM | POA: Diagnosis not present

## 2014-01-20 DIAGNOSIS — R21 Rash and other nonspecific skin eruption: Secondary | ICD-10-CM | POA: Diagnosis not present

## 2014-09-15 DIAGNOSIS — Z23 Encounter for immunization: Secondary | ICD-10-CM | POA: Diagnosis not present

## 2015-01-05 ENCOUNTER — Emergency Department (HOSPITAL_COMMUNITY): Payer: Medicare Other

## 2015-01-05 ENCOUNTER — Observation Stay (HOSPITAL_COMMUNITY)
Admission: EM | Admit: 2015-01-05 | Discharge: 2015-01-06 | Disposition: A | Discharge status: Home / Self Care | Payer: Medicare Other | Attending: Internal Medicine | Admitting: Internal Medicine

## 2015-01-05 ENCOUNTER — Encounter (HOSPITAL_COMMUNITY): Payer: Self-pay | Admitting: Emergency Medicine

## 2015-01-05 DIAGNOSIS — Z79899 Other long term (current) drug therapy: Secondary | ICD-10-CM | POA: Insufficient documentation

## 2015-01-05 DIAGNOSIS — R5383 Other fatigue: Secondary | ICD-10-CM | POA: Insufficient documentation

## 2015-01-05 DIAGNOSIS — R42 Dizziness and giddiness: Secondary | ICD-10-CM | POA: Diagnosis not present

## 2015-01-05 DIAGNOSIS — I10 Essential (primary) hypertension: Secondary | ICD-10-CM | POA: Diagnosis not present

## 2015-01-05 DIAGNOSIS — R079 Chest pain, unspecified: Secondary | ICD-10-CM

## 2015-01-05 DIAGNOSIS — Z23 Encounter for immunization: Secondary | ICD-10-CM | POA: Diagnosis not present

## 2015-01-05 DIAGNOSIS — Z7982 Long term (current) use of aspirin: Secondary | ICD-10-CM | POA: Diagnosis not present

## 2015-01-05 DIAGNOSIS — Z87891 Personal history of nicotine dependence: Secondary | ICD-10-CM | POA: Insufficient documentation

## 2015-01-05 DIAGNOSIS — R05 Cough: Secondary | ICD-10-CM | POA: Diagnosis not present

## 2015-01-05 LAB — BASIC METABOLIC PANEL
Anion gap: 8 (ref 5–15)
BUN: 10 mg/dL (ref 6–23)
CALCIUM: 9.7 mg/dL (ref 8.4–10.5)
CO2: 25 mmol/L (ref 19–32)
CREATININE: 0.9 mg/dL (ref 0.50–1.10)
Chloride: 107 mmol/L (ref 96–112)
GFR, EST AFRICAN AMERICAN: 69 mL/min — AB (ref >90)
GFR, EST NON AFRICAN AMERICAN: 59 mL/min — AB (ref >90)
GLUCOSE: 115 mg/dL — AB (ref 70–99)
POTASSIUM: 3.4 mmol/L — AB (ref 3.5–5.1)
Sodium: 140 mmol/L (ref 135–145)

## 2015-01-05 LAB — CBC WITH DIFFERENTIAL/PLATELET
BASOS ABS: 0 10*3/uL (ref 0.0–0.1)
Basophils Relative: 1 % (ref 0–1)
EOS ABS: 0 10*3/uL (ref 0.0–0.7)
Eosinophils Relative: 1 % (ref 0–5)
HCT: 39.2 % (ref 36.0–46.0)
Hemoglobin: 13 g/dL (ref 12.0–15.0)
Lymphocytes Relative: 28 % (ref 12–46)
Lymphs Abs: 1.6 10*3/uL (ref 0.7–4.0)
MCH: 29.3 pg (ref 26.0–34.0)
MCHC: 33.2 g/dL (ref 30.0–36.0)
MCV: 88.5 fL (ref 78.0–100.0)
MONOS PCT: 9 % (ref 3–12)
Monocytes Absolute: 0.5 10*3/uL (ref 0.1–1.0)
NEUTROS ABS: 3.5 10*3/uL (ref 1.7–7.7)
NEUTROS PCT: 61 % (ref 43–77)
PLATELETS: 308 10*3/uL (ref 150–400)
RBC: 4.43 MIL/uL (ref 3.87–5.11)
RDW: 14.5 % (ref 11.5–15.5)
WBC: 5.6 10*3/uL (ref 4.0–10.5)

## 2015-01-05 LAB — I-STAT TROPONIN, ED: TROPONIN I, POC: 0.01 ng/mL (ref 0.00–0.08)

## 2015-01-05 MED ORDER — ASPIRIN 81 MG PO CHEW
324.0000 mg | CHEWABLE_TABLET | Freq: Once | ORAL | Status: AC
  Administered 2015-01-05: 324 mg via ORAL
  Filled 2015-01-05: qty 4

## 2015-01-05 MED ORDER — NITROGLYCERIN 0.4 MG SL SUBL
0.4000 mg | SUBLINGUAL_TABLET | SUBLINGUAL | Status: DC | PRN
  Filled 2015-01-05: qty 1

## 2015-01-05 NOTE — H&P (Signed)
[facility] History and Physical  Amanda [name] EHM:[id number] DOB: 11-29-1932 DOA: 01/05/2015  Referring physician: Sherwood Gambler, MD PCP: Frederico Hamman, MD   Chief Complaint: Chest pain  HPI: Amanda [name] is a 81 y.o. female presents with chest pain. Patient states that she had been having pain in her chest and back since yesterday on and off. patientt went to work todayu and almost passed out. She instead of going home went to her second job and still was having chest pain. In addition she was noted to be having elevated blood pressure. Apparently she has been having chest pain for the last 3 months or longer. She states that she has associated dizziness also. She has noted some shortness of breath also. She states that she has had some radiation up her neck andd down her arm. She has noted some belly pain. She also has had some pain in her legs. She states no prior  CAD but has had HTN as noted above   Review of Systems:  Complete 12 point ROS performed and is negative other than what is noted in HPI  Past Medical History  Diagnosis Date  . Hypertension    History reviewed. No pertinent past surgical history. Social History:  reports that she has quit smoking. She does not have any smokeless tobacco history on file. She reports that she does not drink alcohol or use illicit drugs.  No Known Allergies  History reviewed. No pertinent family history.   Prior to Admission medications   Medication Sig Start Date End Date Taking? Authorizing Provider  aspirin 81 MG tablet Take 81 mg by mouth daily.    Historical Provider, MD  lisinopril (PRINIVIL,ZESTRIL) 20 MG tablet Take 0.5 tablets (10 mg total) by mouth daily. 12/19/11 12/18/12  Clayton Bibles, PA-C  meclizine (ANTIVERT) 25 MG tablet 1/2 tablet every 6 to 8 hours prn dizziness. Take with plenty of water. Patient not taking: Reported on 01/05/2015 08/22/13   Janne Napoleon, NP  mupirocin ointment (BACTROBAN) 2 % Apply 1  application topically 3 (three) times daily. Patient not taking: Reported on 01/05/2015 10/25/13   Harden Mo, MD   Physical Exam: Filed Vitals:   01/05/15 1856 01/05/15 2201 01/05/15 2231  BP: 146/94 178/86   Pulse: 99 85   Temp: 97.6 F (36.4 C)    TempSrc: Oral    Resp: 18 20   SpO2: 96% 96% 95%    Wt Readings from Last 3 Encounters:  No data found for Wt    General:  Appears calm and comfortable Eyes: PERRL, normal lids, irises & conjunctiva ENT: grossly normal hearing, lips & tongue Neck: no LAD, masses or thyromegaly Cardiovascular: RRR, no m/r/g. No LE edema. Respiratory: CTA bilaterally, no w/r/r. Normal respiratory effort. Abdomen: soft, ntnd Skin: no rash or induration seen on limited exam Musculoskeletal: grossly normal tone BUE/BLE Psychiatric: grossly normal mood and affect, speech fluent and appropriate Neurologic: grossly non-focal.          Labs on Admission:  Basic Metabolic Panel:  Recent Labs Lab 01/05/15 1859  NA 140  K 3.4*  CL 107  CO2 25  GLUCOSE 115*  BUN 10  CREATININE 0.90  CALCIUM 9.7   Liver Function Tests: No results for input(s): AST, ALT, ALKPHOS, BILITOT, PROT, ALBUMIN in the last 168 hours. No results for input(s): LIPASE, AMYLASE in the last 168 hours. No results for input(s): AMMONIA in the last 168 hours. CBC:  Recent Labs Lab 01/05/15 1859  WBC 5.6  NEUTROABS 3.5  HGB 13.0  HCT 39.2  MCV 88.5  PLT 308   Cardiac Enzymes: No results for input(s): CKTOTAL, CKMB, CKMBINDEX, TROPONINI in the last 168 hours.  BNP (last 3 results) No results for input(s): BNP in the last 8760 hours.  ProBNP (last 3 results) No results for input(s): PROBNP in the last 8760 hours.  CBG: No results for input(s): GLUCAP in the last 168 hours.  Radiological Exams on Admission: Dg Chest 2 View  01/05/2015   CLINICAL DATA:  Chest pain and cough  EXAM: CHEST  2 VIEW  COMPARISON:  01/04/2008  FINDINGS: Cardiac shadow is mildly  enlarged but stable. The lungs are well aerated bilaterally. No focal infiltrate or sizable effusion is seen. Mild interstitial changes are again noted. No acute bony abnormality is seen.  IMPRESSION: No active cardiopulmonary disease.   Electronically Signed   By: Inez Catalina M.D.   On: 01/05/2015 19:54     Assessment/Plan Active Problems:   Chest pain   HTN (hypertension)   1. Chest pain -will check serial enzymes -currently she is pain free -maybe related to elevated Blood pressure from not taking her medications -cardiology evaluation -will get echo  2. HTN -will restart her lisinopril -monitor pressures  3. Hypokalemia -repeat labs in am    Code Status: Full Code (must indicate code status--if unknown or must be presumed, indicate so) DVT Prophylaxis:Heparin Family Communication: daughter (indicate person spoken with, if applicable, with phone number if by telephone) Disposition Plan: Home (indicate anticipated LOS)  Time spent: 69min  Yancy Knoble A [facility] Pager [phone]

## 2015-01-05 NOTE — ED Notes (Signed)
Pt c/o CP on left side and some dizziness today; pt sts some htn

## 2015-01-05 NOTE — ED Provider Notes (Signed)
CSN: [id number]     Arrival date & time 01/05/15  1841 History   First MD Initiated Contact with Patient 01/05/15 2157     Chief Complaint  Patient presents with  . Chest Pain  . Dizziness     (Consider location/radiation/quality/duration/timing/severity/associated sxs/prior Treatment) HPI  81 year old female presents with chest pain, dizziness, and hypertension. Patient has been having these symptoms for "a while" but according to granddaughter the patient had significant symptoms last night and today. Almost passed out from dizziness today which was abnormal for her and had a blood pressure check at work and was noted to be hypertensive. She used to be on lisinopril but took herself off because her blood pressure has been doing better while on the blood pressure medicine. Patient states chest pain occurs nearly every day but apparently she had more severe chest pain today. Is a sharp chest pain. Not worsened with inspiration. No exertional symptoms. Patient right now stationed have dizziness but she does have mild chest pain. No shortness of breath. No weakness or numbness. No vertiginous symptoms.  Past Medical History  Diagnosis Date  . Hypertension    History reviewed. No pertinent past surgical history. History reviewed. No pertinent family history. History  Substance Use Topics  . Smoking status: Former Research scientist (life sciences)  . Smokeless tobacco: Not on file  . Alcohol Use: No   OB History    No data available     Review of Systems  Constitutional: Positive for fatigue. Negative for fever.  Respiratory: Negative for shortness of breath.   Cardiovascular: Positive for chest pain.  Gastrointestinal: Negative for vomiting and abdominal pain.  Neurological: Positive for dizziness. Negative for weakness.  All other systems reviewed and are negative.     Allergies  Review of patient's allergies indicates no known allergies.  Home Medications   Prior to Admission medications    Medication Sig Start Date End Date Taking? Authorizing Provider  aspirin 81 MG tablet Take 81 mg by mouth daily.    Historical Provider, MD  lisinopril (PRINIVIL,ZESTRIL) 20 MG tablet Take 0.5 tablets (10 mg total) by mouth daily. 12/19/11 12/18/12  Clayton Bibles, PA-C  meclizine (ANTIVERT) 25 MG tablet 1/2 tablet every 6 to 8 hours prn dizziness. Take with plenty of water. Patient not taking: Reported on 01/05/2015 08/22/13   Janne Napoleon, NP  mupirocin ointment (BACTROBAN) 2 % Apply 1 application topically 3 (three) times daily. Patient not taking: Reported on 01/05/2015 10/25/13   Harden Mo, MD   BP 178/86 mmHg  Pulse 85  Temp(Src) 97.6 F (36.4 C) (Oral)  Resp 20  SpO2 96% Physical Exam  Constitutional: She is oriented to person, place, and time. She appears well-developed and well-nourished.  HENT:  Head: Normocephalic and atraumatic.  Right Ear: External ear normal.  Left Ear: External ear normal.  Nose: Nose normal.  Eyes: EOM are normal. Pupils are equal, round, and reactive to light. Right eye exhibits no discharge. Left eye exhibits no discharge.  Cardiovascular: Normal rate, regular rhythm and normal heart sounds.   Pulmonary/Chest: Effort normal and breath sounds normal. She exhibits tenderness (over sternum).  Abdominal: Soft. She exhibits no distension. There is no tenderness.  Neurological: She is alert and oriented to person, place, and time.  CN 2-12 grossly intact. 5/5 strength in all 4 extremities  Skin: Skin is warm and dry.  Nursing note and vitals reviewed.   ED Course  Procedures (including critical care time) Labs Review Labs Reviewed  BASIC METABOLIC  PANEL - Abnormal; Notable for the following:    Potassium 3.4 (*)    Glucose, Bld 115 (*)    GFR calc non Af Amer 59 (*)    GFR calc Af Amer 69 (*)    All other components within normal limits  CBC WITH DIFFERENTIAL/PLATELET  Randolm Idol, ED    Imaging Review Dg Chest 2 View  01/05/2015   CLINICAL  DATA:  Chest pain and cough  EXAM: CHEST  2 VIEW  COMPARISON:  01/04/2008  FINDINGS: Cardiac shadow is mildly enlarged but stable. The lungs are well aerated bilaterally. No focal infiltrate or sizable effusion is seen. Mild interstitial changes are again noted. No acute bony abnormality is seen.  IMPRESSION: No active cardiopulmonary disease.   Electronically Signed   By: Inez Catalina M.D.   On: 01/05/2015 19:54     EKG Interpretation   Date/Time:  Tuesday January 05 2015 18:55:18 EST Ventricular Rate:  99 PR Interval:  138 QRS Duration: 72 QT Interval:  342 QTC Calculation: 438 R Axis:   12 Text Interpretation:  Sinus rhythm with occasional Premature ventricular  complexes Possible Left atrial enlargement Left ventricular hypertrophy T  wave abnormality, consider lateral ischemia Abnormal ECG T wave changes  appear new compared to 2011 Confirmed by Deandra Gadson  MD, [place] ([id number]) on  01/05/2015 9:58:45 PM      MDM   Final diagnoses:  Essential hypertension    Patient's chest pain resolved with aspirin and nitroglycerin. She had no focal neurologic symptoms and no current dizziness to suggest needing a CT scan. No headache. I believe that her poorly controlled blood pressures controlled into her chest pain given her new T-wave inversions laterally she will need observation admission for serial troponins and blood pressure control. Discussed with Dr. Carren Rang who will admit to telemetry.    Ephraim Hamburger, MD 01/05/15 [phone]

## 2015-01-06 ENCOUNTER — Encounter (HOSPITAL_COMMUNITY): Payer: Self-pay | Admitting: *Deleted

## 2015-01-06 DIAGNOSIS — R42 Dizziness and giddiness: Secondary | ICD-10-CM

## 2015-01-06 DIAGNOSIS — R072 Precordial pain: Secondary | ICD-10-CM

## 2015-01-06 DIAGNOSIS — I10 Essential (primary) hypertension: Secondary | ICD-10-CM | POA: Diagnosis not present

## 2015-01-06 DIAGNOSIS — R079 Chest pain, unspecified: Secondary | ICD-10-CM | POA: Diagnosis not present

## 2015-01-06 LAB — CREATININE, SERUM
CREATININE: 0.8 mg/dL (ref 0.50–1.10)
GFR calc Af Amer: 79 mL/min — ABNORMAL LOW (ref >90)
GFR calc non Af Amer: 68 mL/min — ABNORMAL LOW (ref >90)

## 2015-01-06 LAB — COMPREHENSIVE METABOLIC PANEL
ALBUMIN: 3.3 g/dL — AB (ref 3.5–5.2)
ALT: 12 U/L (ref 0–35)
AST: 16 U/L (ref 0–37)
Alkaline Phosphatase: 37 U/L — ABNORMAL LOW (ref 39–117)
Anion gap: 6 (ref 5–15)
BUN: 8 mg/dL (ref 6–23)
CALCIUM: 9.4 mg/dL (ref 8.4–10.5)
CO2: 26 mmol/L (ref 19–32)
Chloride: 108 mmol/L (ref 96–112)
Creatinine, Ser: 0.83 mg/dL (ref 0.50–1.10)
GFR calc non Af Amer: 65 mL/min — ABNORMAL LOW (ref >90)
GFR, EST AFRICAN AMERICAN: 76 mL/min — AB (ref >90)
GLUCOSE: 94 mg/dL (ref 70–99)
Potassium: 3.3 mmol/L — ABNORMAL LOW (ref 3.5–5.1)
Sodium: 140 mmol/L (ref 135–145)
TOTAL PROTEIN: 6.3 g/dL (ref 6.0–8.3)
Total Bilirubin: 0.5 mg/dL (ref 0.3–1.2)

## 2015-01-06 LAB — LIPID PANEL
CHOLESTEROL: 167 mg/dL (ref 0–200)
HDL: 40 mg/dL (ref >39)
LDL Cholesterol: 116 mg/dL — ABNORMAL HIGH (ref 0–99)
TRIGLYCERIDES: 53 mg/dL (ref <150)
Total CHOL/HDL Ratio: 4.2 RATIO
VLDL: 11 mg/dL (ref 0–40)

## 2015-01-06 LAB — TROPONIN I
Troponin I: 0.03 ng/mL (ref <0.031)
Troponin I: <0.03 ng/mL (ref <0.031)

## 2015-01-06 LAB — CBC
HEMATOCRIT: 37 % (ref 36.0–46.0)
HEMATOCRIT: 36.5 % (ref 36.0–46.0)
HEMOGLOBIN: 12.5 g/dL (ref 12.0–15.0)
HEMOGLOBIN: 12 g/dL (ref 12.0–15.0)
MCH: 29.8 pg (ref 26.0–34.0)
MCH: 29.8 pg (ref 26.0–34.0)
MCHC: 33.8 g/dL (ref 30.0–36.0)
MCHC: 32.9 g/dL (ref 30.0–36.0)
MCV: 88.1 fL (ref 78.0–100.0)
MCV: 90.6 fL (ref 78.0–100.0)
Platelets: 304 10*3/uL (ref 150–400)
Platelets: 271 10*3/uL (ref 150–400)
RBC: 4.2 MIL/uL (ref 3.87–5.11)
RBC: 4.03 MIL/uL (ref 3.87–5.11)
RDW: 14.6 % (ref 11.5–15.5)
RDW: 14.7 % (ref 11.5–15.5)
WBC: 5.3 10*3/uL (ref 4.0–10.5)
WBC: 5 10*3/uL (ref 4.0–10.5)

## 2015-01-06 LAB — TSH: TSH: 1.273 u[IU]/mL (ref 0.350–4.500)

## 2015-01-06 MED ORDER — ACETAMINOPHEN 325 MG PO TABS: 650.0000 mg | ORAL_TABLET | Freq: Four times a day (QID) | ORAL | Status: DC | PRN

## 2015-01-06 MED ORDER — ONDANSETRON HCL 4 MG/2ML IJ SOLN: 4.0000 mg | Freq: Four times a day (QID) | INTRAMUSCULAR | Status: DC | PRN

## 2015-01-06 MED ORDER — ASPIRIN EC 325 MG PO TBEC
325.0000 mg | DELAYED_RELEASE_TABLET | Freq: Every day | ORAL | Status: DC
  Administered 2015-01-06: 325 mg via ORAL
  Filled 2015-01-06: qty 1

## 2015-01-06 MED ORDER — SODIUM CHLORIDE 0.9 % IV SOLN
INTRAVENOUS | Status: DC
  Administered 2015-01-06: 02:00:00 via INTRAVENOUS

## 2015-01-06 MED ORDER — SODIUM CHLORIDE 0.9 % IJ SOLN
3.0000 mL | Freq: Two times a day (BID) | INTRAMUSCULAR | Status: DC
  Administered 2015-01-06: 3 mL via INTRAVENOUS

## 2015-01-06 MED ORDER — ALUM & MAG HYDROXIDE-SIMETH 200-200-20 MG/5ML PO SUSP: 30.0000 mL | Freq: Four times a day (QID) | ORAL | Status: DC | PRN

## 2015-01-06 MED ORDER — FOLIC ACID 1 MG PO TABS
1.0000 mg | ORAL_TABLET | Freq: Every day | ORAL | Status: DC
  Administered 2015-01-06: 1 mg via ORAL
  Filled 2015-01-06: qty 1

## 2015-01-06 MED ORDER — MORPHINE SULFATE 2 MG/ML IJ SOLN: 1.0000 mg | INTRAMUSCULAR | Status: DC | PRN

## 2015-01-06 MED ORDER — ONDANSETRON HCL 4 MG PO TABS: 4.0000 mg | ORAL_TABLET | Freq: Four times a day (QID) | ORAL | Status: DC | PRN

## 2015-01-06 MED ORDER — NITROGLYCERIN 0.4 MG SL SUBL: 0.4000 mg | SUBLINGUAL_TABLET | SUBLINGUAL | Status: DC | PRN

## 2015-01-06 MED ORDER — POTASSIUM CHLORIDE CRYS ER 20 MEQ PO TBCR
40.0000 meq | EXTENDED_RELEASE_TABLET | Freq: Once | ORAL | Status: AC
  Administered 2015-01-06: 40 meq via ORAL
  Filled 2015-01-06: qty 2

## 2015-01-06 MED ORDER — INFLUENZA VAC SPLIT QUAD 0.5 ML IM SUSY
0.5000 mL | PREFILLED_SYRINGE | INTRAMUSCULAR | Status: AC
  Administered 2015-01-06: 0.5 mL via INTRAMUSCULAR
  Filled 2015-01-06: qty 0.5

## 2015-01-06 MED ORDER — LISINOPRIL 5 MG PO TABS
5.0000 mg | ORAL_TABLET | Freq: Every day | ORAL | Status: DC
  Administered 2015-01-06 (×2): 5 mg via ORAL
  Filled 2015-01-06 (×2): qty 1

## 2015-01-06 MED ORDER — ACETAMINOPHEN 650 MG RE SUPP: 650.0000 mg | Freq: Four times a day (QID) | RECTAL | Status: DC | PRN

## 2015-01-06 MED ORDER — ADULT MULTIVITAMIN W/MINERALS CH
1.0000 | ORAL_TABLET | Freq: Every day | ORAL | Status: DC
  Administered 2015-01-06: 1 via ORAL
  Filled 2015-01-06: qty 1

## 2015-01-06 MED ORDER — LISINOPRIL 20 MG PO TABS: 10.0000 mg | ORAL_TABLET | Freq: Every day | ORAL | Status: DC

## 2015-01-06 MED ORDER — VITAMIN B-1 100 MG PO TABS
100.0000 mg | ORAL_TABLET | Freq: Every day | ORAL | Status: DC
  Administered 2015-01-06: 100 mg via ORAL
  Filled 2015-01-06: qty 1

## 2015-01-06 MED ORDER — HEPARIN SODIUM (PORCINE) 5000 UNIT/ML IJ SOLN
5000.0000 [IU] | Freq: Three times a day (TID) | INTRAMUSCULAR | Status: DC
  Administered 2015-01-06: 5000 [IU] via SUBCUTANEOUS
  Filled 2015-01-06 (×2): qty 1

## 2015-01-06 NOTE — Consult Note (Addendum)
Admit date: 01/05/2015 Referring Physician  Dr. Erlinda Hong Primary Physician Frederico Hamman, MD Primary Cardiologist  None Reason for Consultation  chest pain  HPI: 81 year old with chronic intermittent chest discomfort over the past 3-6 months with other associated symptoms such as neck pain, arm pain, belly pain, leg pain who presented to the emergency department with chest discomfort yesterday intermittently. Pain was described as sharp, nonexertional. She still works and apparently at work she almost fainted. She has hypertension as well that was elevated yesterday. She had recently stopped her lisinopril because of normal blood pressures. Blood pressure on arrival was 178/86. She also had some tenderness over her sternal area which continues. She just ate breakfast. Doing well. She is asking to go home.  Troponin has been negative. Creatinine 0.8. Hemoglobin 12.0. Chest x-ray unremarkable. In the ER, chest pain seemed to resolve with aspirin and nitroglycerin. No focal symptoms otherwise.  EKG with sinus rhythm, rate 84, LVH, multiple PVCs noted. Original EKG showed some nonspecific ST-T wave changes, minor inversion in lateral leads.  PMH:   Past Medical History  Diagnosis Date  . Hypertension     PSH:  History reviewed. No pertinent past surgical history. Allergies:  Review of patient's allergies indicates no known allergies. Prior to Admit Meds:   Prior to Admission medications   Medication Sig Start Date End Date Taking? Authorizing Provider  aspirin 81 MG tablet Take 81 mg by mouth daily.    Historical Provider, MD  lisinopril (PRINIVIL,ZESTRIL) 20 MG tablet Take 0.5 tablets (10 mg total) by mouth daily. 12/19/11 12/18/12  Clayton Bibles, PA-C  meclizine (ANTIVERT) 25 MG tablet 1/2 tablet every 6 to 8 hours prn dizziness. Take with plenty of water. Patient not taking: Reported on 01/05/2015 08/22/13   Janne Napoleon, NP  mupirocin ointment (BACTROBAN) 2 % Apply 1 application topically 3  (three) times daily. Patient not taking: Reported on 01/05/2015 10/25/13   Harden Mo, MD   Fam HX:   History reviewed. No pertinent family history. Social HX:    History   Social History  . Marital Status: Divorced    Spouse Name: N/A  . Number of Children: N/A  . Years of Education: N/A   Occupational History  . Not on file.   Social History Main Topics  . Smoking status: Former Research scientist (life sciences)  . Smokeless tobacco: Not on file  . Alcohol Use: No  . Drug Use: No  . Sexual Activity: No   Other Topics Concern  . Not on file   Social History Narrative     ROS:  No frank syncope, no orthopnea, no PND, no rashes. All 11 ROS were addressed and are negative except what is stated in the HPI   Physical Exam: Blood pressure 161/79, pulse 69, temperature 98.6 F (37 C), temperature source Oral, resp. rate 19, height 5\' 2"  (1.575 m), weight 113 lb 5.1 oz (51.4 kg), SpO2 99 %.   General: Well developed, thin, in no acute distress Head: Eyes PERRLA, No xanthomas.   Normal cephalic and atramatic  Lungs:   Clear bilaterally to auscultation and percussion. Normal respiratory effort. No wheezes, no rales. Heart:   HRRR S1 S2 Pulses are 2+ & equal. No murmur, rubs, gallops.  No carotid bruit. No JVD.  No abdominal bruits.  Abdomen: Bowel sounds are positive, abdomen soft and non-tender without masses. No hepatosplenomegaly. Msk:  Back normal. Normal strength and tone for age. Extremities:  No clubbing, cyanosis or edema.  DP +1  Neuro: Alert and oriented X 3, non-focal, MAE x 4 GU: Deferred Rectal: Deferred Psych:  Good affect, responds appropriately      Labs: Lab Results  Component Value Date   WBC 5.0 01/06/2015   HGB 12.0 01/06/2015   HCT 36.5 01/06/2015   MCV 90.6 01/06/2015   PLT 271 01/06/2015     Recent Labs Lab 01/06/15 0637  NA 140  K 3.3*  CL 108  CO2 26  BUN 8  CREATININE 0.83  CALCIUM 9.4  PROT 6.3  BILITOT 0.5  ALKPHOS 37*  ALT 12  AST 16  GLUCOSE 94      Recent Labs  01/06/15 0118 01/06/15 0637  TROPONINI 0.03 <0.03     Radiology:  Dg Chest 2 View  01/05/2015   CLINICAL DATA:  Chest pain and cough  EXAM: CHEST  2 VIEW  COMPARISON:  01/04/2008  FINDINGS: Cardiac shadow is mildly enlarged but stable. The lungs are well aerated bilaterally. No focal infiltrate or sizable effusion is seen. Mild interstitial changes are again noted. No acute bony abnormality is seen.  IMPRESSION: No active cardiopulmonary disease.   Electronically Signed   By: Inez Catalina M.D.   On: 01/05/2015 19:54   Personally viewed. Fairly hyperinflated lungs.  EKG:  Original EKG demonstrated nonspecific ST changes, current EKG demonstrates PVCs. See above for details. Personally viewed.   ASSESSMENT/PLAN:    81 year old female with atypical chest pain, multiple somatic symptoms, dizziness, PVCs, hypertension with negative troponin, mildly abnormal EKG with minimal, nonspecific T-wave changes lateral leads initially.  1. Atypical chest pain-clearly she has tenderness in her midsternal region which is likely musculoskeletal in origin from lifting. She is quite active, working 5 days a week. With her advanced age, hypertension we will go ahead and set her up for outpatient nuclear stress test for further evaluation of potential ischemia. Low likelihood that her current chest pain episode is cardiac related. PVCs have abated.  2. PVCs-abated overnight. No advanced arrhythmias. Replete potassium. Could consider low-dose such as 10 mEq per day as outpatient.  3. Hypertension-per primary team. Continue with current regimen. May need further advancement.  We will set her up with outpatient nuclear stress test. Troponin normal. Comfortable with discharge. Office will contact her regarding timing of stress test.  No adverse arrhythmias on telemetry such as pulses given her symptom of dizziness. I wonder if this is more vertigo related as she has been on meclizine in the past.     Candee Furbish, MD  01/06/2015  9:04 AM

## 2015-01-06 NOTE — Care Management Note (Signed)
    Page 1 of 1   01/06/2015     10:58:19 AM CARE MANAGEMENT NOTE 01/06/2015  Patient:  [facility]   Account Number:  [id number]  Date Initiated:  01/06/2015  Documentation initiated by:  Whitman Hero  Subjective/Objective Assessment:   PTA from home alone admitted with CP.     Action/Plan:   Return to home upon d/c   Anticipated DC Date:  01/07/2015   Anticipated DC Plan:  [place]  CM consult      Choice offered to / List presented to:             Status of service:  Completed, signed off Medicare Important Message given?  NO (If response is "NO", the following Medicare IM given date fields will be blank) Date Medicare IM given:   Medicare IM given by:   Date Additional Medicare IM given:   Additional Medicare IM given by:    Discharge Disposition:  HOME/SELF CARE  Per UR Regulation:    If discussed at Long Length of Stay Meetings, dates discussed:    Comments:  01/06/2015 @ 10:30 Whitman Hero RN,BSN Spoke with pt regarding ? no PCP. Pt  confirmed PCP move, provided  [employer].  Pt to call PCP in area.

## 2015-01-06 NOTE — Progress Notes (Signed)
  Echocardiogram 2D Echocardiogram has been performed.  Donata Clay 01/06/2015, 11:25 AM

## 2015-01-06 NOTE — Discharge Summary (Signed)
Discharge Summary  Amanda [name] YQM:[id number] DOB: [birth year]  PCP: Frederico Hamman, MD  Admit date: 01/05/2015 Discharge date: 01/06/2015  Time spent: less than 30mins  Recommendations for Outpatient Follow-up:  1. F/u with pmd for further bp meds adjustment, pmd to repeat bmp in one week. 2. F/u with cardiology for outpatient stress test  Discharge Diagnoses:  Active Hospital Problems   Diagnosis Date Noted  . Chest pain 01/05/2015  . HTN (hypertension) 01/05/2015    Resolved Hospital Problems   Diagnosis Date Noted Date Resolved  No resolved problems to display.    Discharge Condition: stable, chest pain free  Diet recommendation: heart healthy  Filed Weights   01/06/15 0028  Weight: 51.4 kg (113 lb 5.1 oz)    History of present illness:  Amanda [name] is a 81 y.o. female presents with chest pain. Patient states that she had been having pain in her chest and back since yesterday on and off. patientt went to work todayu and almost passed out. She instead of going home went to her second job and still was having chest pain. In addition she was noted to be having elevated blood pressure. Apparently she has been having chest pain for the last 3 months or longer. She states that she has associated dizziness also. She has noted some shortness of breath also. She states that she has had some radiation up her neck andd down her arm. She has noted some belly pain. She also has had some pain in her legs. She states no prior CAD but has had HTN as noted above Patient at baseline is very active, works daily.  Hospital Course:  Active Problems:   Chest pain   HTN (hypertension)  1. Chest pain -negative serial enzymes -currently she is pain free -maybe related to elevated Blood pressure from not taking her medications -cardiology evaluation, cleared for discharge and outpatient stress test.   2. HTN -will restart her lisinopril/start asa 81mg  -advised patient to  perform home bp monitoring, further bp meds adjustment per pmd  3. Hypokalemia -replaced, pmd to repeat bmp in one week  Procedures:  echo  Consultations:  cardiology  Discharge Exam: BP 161/79 mmHg  Pulse 69  Temp(Src) 98.6 F (37 C) (Oral)  Resp 19  Ht 5\' 2"  (1.575 m)  Wt 51.4 kg (113 lb 5.1 oz)  BMI 20.72 kg/m2  SpO2 99%  2. General: Appears calm and comfortable 3. Eyes: PERRL, normal lids, irises & conjunctiva 4. ENT: grossly normal hearing, lips & tongue 5. Neck: no LAD, masses or thyromegaly 6. Cardiovascular: RRR, no m/r/g. No LE edema. 7. Respiratory: CTA bilaterally, no w/r/r. Normal respiratory effort. 8. Abdomen: soft, ntnd 9. Skin: no rash or induration seen on limited exam 10. Musculoskeletal: grossly normal tone BUE/BLE 11. Psychiatric: grossly normal mood and affect, speech fluent and appropriate 12. Neurologic: grossly non-focal.   Discharge Instructions You were cared for by a hospitalist during your hospital stay. If you have any questions about your discharge medications or the care you received while you were in the hospital after you are discharged, you can call the unit and asked to speak with the hospitalist on call if the hospitalist that took care of you is not available. Once you are discharged, your primary care physician will handle any further medical issues. Please note that NO REFILLS for any discharge medications will be authorized once you are discharged, as it is imperative that you return to your primary care physician (or establish a relationship  with a primary care physician if you do not have one) for your aftercare needs so that they can reassess your need for medications and monitor your lab values.  Discharge Instructions    Diet - low sodium heart healthy    Complete by:  As directed      Increase activity slowly    Complete by:  As directed             Medication List    STOP taking these medications        meclizine 25  MG tablet  Commonly known as:  ANTIVERT     mupirocin ointment 2 %  Commonly known as:  BACTROBAN      TAKE these medications        aspirin 81 MG tablet  Take 81 mg by mouth daily.     lisinopril 20 MG tablet  Commonly known as:  PRINIVIL,ZESTRIL  Take 0.5 tablets (10 mg total) by mouth daily.     nitroGLYCERIN 0.4 MG SL tablet  Commonly known as:  NITROSTAT  Place 1 tablet (0.4 mg total) under the tongue every 5 (five) minutes as needed for chest pain.       No Known Allergies     Follow-up Information    Follow up with Candee Furbish, MD In 2 weeks.   Specialty:  Cardiology   Contact information:   [address] [address] [phone]       Follow up with Gracy Racer A, MD In 2 weeks.   Specialty:  Obstetrics and Gynecology   Contact information:   [address] [place] [address] [phone]        The results of significant diagnostics from this hospitalization (including imaging, microbiology, ancillary and laboratory) are listed below for reference.    Significant Diagnostic Studies: Dg Chest 2 View  01/05/2015   CLINICAL DATA:  Chest pain and cough  EXAM: CHEST  2 VIEW  COMPARISON:  01/04/2008  FINDINGS: Cardiac shadow is mildly enlarged but stable. The lungs are well aerated bilaterally. No focal infiltrate or sizable effusion is seen. Mild interstitial changes are again noted. No acute bony abnormality is seen.  IMPRESSION: No active cardiopulmonary disease.   Electronically Signed   By: Inez Catalina M.D.   On: 01/05/2015 19:54    Microbiology: No results found for this or any previous visit (from the past 240 hour(s)).   Labs: Basic Metabolic Panel:  Recent Labs Lab 01/05/15 1859 01/06/15 0118 01/06/15 0637  NA 140  --  140  K 3.4*  --  3.3*  CL 107  --  108  CO2 25  --  26  GLUCOSE 115*  --  94  BUN 10  --  8  CREATININE 0.90 0.80 0.83  CALCIUM 9.7  --  9.4   Liver Function  Tests:  Recent Labs Lab 01/06/15 0637  AST 16  ALT 12  ALKPHOS 37*  BILITOT 0.5  PROT 6.3  ALBUMIN 3.3*   No results for input(s): LIPASE, AMYLASE in the last 168 hours. No results for input(s): AMMONIA in the last 168 hours. CBC:  Recent Labs Lab 01/05/15 1859 01/06/15 0118 01/06/15 0637  WBC 5.6 5.3 5.0  NEUTROABS 3.5  --   --   HGB 13.0 12.5 12.0  HCT 39.2 37.0 36.5  MCV 88.5 88.1 90.6  PLT 308 304 271   Cardiac Enzymes:  Recent Labs Lab 01/06/15 0118 01/06/15 1443  TROPONINI 0.03 <0.03   BNP: BNP (last 3 results) No results for input(s): BNP in the last 8760 hours.  ProBNP (last 3 results) No results for input(s): PROBNP in the last 8760 hours.  CBG: No results for input(s): GLUCAP in the last 168 hours.     Signed:  Timea Breed  [facility] 01/06/2015, 1:18 PM

## 2015-01-06 NOTE — Progress Notes (Signed)
UR completed 

## 2015-01-07 LAB — HEMOGLOBIN A1C
HEMOGLOBIN A1C: 6.1 % — AB (ref 4.8–5.6)
Mean Plasma Glucose: 128 mg/dL

## 2015-01-21 ENCOUNTER — Ambulatory Visit (INDEPENDENT_AMBULATORY_CARE_PROVIDER_SITE_OTHER): Payer: Medicare Other | Admitting: Cardiology

## 2015-01-21 ENCOUNTER — Encounter: Payer: Self-pay | Admitting: Cardiology

## 2015-01-21 DIAGNOSIS — I10 Essential (primary) hypertension: Secondary | ICD-10-CM

## 2015-01-21 DIAGNOSIS — I119 Hypertensive heart disease without heart failure: Secondary | ICD-10-CM | POA: Insufficient documentation

## 2015-01-21 DIAGNOSIS — I493 Ventricular premature depolarization: Secondary | ICD-10-CM | POA: Insufficient documentation

## 2015-01-21 MED ORDER — HYDROCHLOROTHIAZIDE 12.5 MG PO CAPS: 12.5000 mg | ORAL_CAPSULE | Freq: Every day | ORAL | Status: DC
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
## 2015-01-21 NOTE — Assessment & Plan Note (Signed)
LVH on EKG 

## 2015-01-21 NOTE — Assessment & Plan Note (Signed)
Not controlled 

## 2015-01-21 NOTE — Patient Instructions (Signed)
**Note De-Identified  Obfuscation** Your physician has recommended you make the following change in your medication: start taking HCTZ 12.5 mg daily  Your physician recommends that you return for lab work in: 2 weeks (on the day before your next follow up)  Your physician recommends that you schedule a follow-up appointment in: 2 weeks

## 2015-01-21 NOTE — Assessment & Plan Note (Signed)
No further chest pain

## 2015-01-21 NOTE — Assessment & Plan Note (Signed)
Asymptomatic. 

## 2015-02-08 ENCOUNTER — Other Ambulatory Visit (INDEPENDENT_AMBULATORY_CARE_PROVIDER_SITE_OTHER): Payer: Medicare Other | Admitting: *Deleted

## 2015-02-08 DIAGNOSIS — I10 Essential (primary) hypertension: Secondary | ICD-10-CM | POA: Diagnosis not present

## 2015-02-08 LAB — BASIC METABOLIC PANEL
BUN: 15 mg/dL (ref 6–23)
CO2: 27 mEq/L (ref 19–32)
Calcium: 10 mg/dL (ref 8.4–10.5)
Chloride: 103 mEq/L (ref 96–112)
Creatinine, Ser: 0.88 mg/dL (ref 0.40–1.20)
GFR: 79.61 mL/min (ref >60.00)
Glucose, Bld: 84 mg/dL (ref 70–99)
Potassium: 3.5 mEq/L (ref 3.5–5.1)
Sodium: 138 mEq/L (ref 135–145)

## 2015-02-09 ENCOUNTER — Ambulatory Visit (INDEPENDENT_AMBULATORY_CARE_PROVIDER_SITE_OTHER): Payer: Medicare Other | Admitting: Cardiology

## 2015-02-09 ENCOUNTER — Encounter: Payer: Self-pay | Admitting: Cardiology

## 2015-02-09 VITALS — BP 112/78 | HR 74 | Ht 61.0 in | Wt 108.1 lb

## 2015-02-09 DIAGNOSIS — I10 Essential (primary) hypertension: Secondary | ICD-10-CM

## 2015-02-09 NOTE — Assessment & Plan Note (Signed)
Under better control, renal function stable.

## 2015-02-09 NOTE — Patient Instructions (Addendum)
Your physician recommends that you continue on your current medications as directed. Please refer to the Current Medication list given to you today.    Your physician wants you to follow-up in:  [place] will receive a reminder letter in the mail two months in advance. If you don't receive a letter, please call our office to schedule the follow-up appointment.   NEED TO BE REFFERED TO [facility] FOR HYPERTENSION

## 2015-02-09 NOTE — Assessment & Plan Note (Signed)
Resolved
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
## 2015-02-24 ENCOUNTER — Ambulatory Visit (INDEPENDENT_AMBULATORY_CARE_PROVIDER_SITE_OTHER): Payer: Medicare Other | Admitting: Adult Health

## 2015-02-24 ENCOUNTER — Telehealth: Payer: Self-pay | Admitting: Adult Health

## 2015-02-24 ENCOUNTER — Encounter: Payer: Self-pay | Admitting: Adult Health

## 2015-02-24 VITALS — BP 142/90 | HR 78 | Temp 98.5°F | Ht 61.0 in | Wt 110.1 lb

## 2015-02-24 DIAGNOSIS — R413 Other amnesia: Secondary | ICD-10-CM | POA: Diagnosis not present

## 2015-02-24 DIAGNOSIS — I10 Essential (primary) hypertension: Secondary | ICD-10-CM | POA: Diagnosis not present

## 2015-02-24 DIAGNOSIS — Z7189 Other specified counseling: Secondary | ICD-10-CM

## 2015-02-24 DIAGNOSIS — Z7689 Persons encountering health services in other specified circumstances: Secondary | ICD-10-CM

## 2015-02-24 MED ORDER — LISINOPRIL 20 MG PO TABS: 10.0000 mg | ORAL_TABLET | Freq: Every day | ORAL | Status: DC

## 2015-02-24 NOTE — Progress Notes (Signed)
Pre visit review using our clinic review tool, if applicable. No additional management support is needed unless otherwise documented below in the visit note. 

## 2015-02-24 NOTE — Patient Instructions (Addendum)
I have sent a prescription to your pharmacy for lisinopril, take one half a pill and continue to measure your blood pressure. If your blood pressure continues to be high, then you can take a entire pill.   Try and cut down on the number of pre made meals you are eating as these have a lot of salt in them and can drive your blood pressure up.   Continue to keep an eye on your memory, we will address this the next time you come in.  Your labs were drawn recently and they all look good. We will schedule a follow up appointment and recheck those labs.   It was a pleasure meeting you and welcome to the team!

## 2015-02-24 NOTE — Telephone Encounter (Signed)
emmi mailed  °
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
## 2015-04-29 ENCOUNTER — Other Ambulatory Visit: Payer: Self-pay | Admitting: Adult Health

## 2015-05-31 ENCOUNTER — Other Ambulatory Visit: Payer: Self-pay | Admitting: Cardiology

## 2015-05-31 NOTE — Telephone Encounter (Signed)
REFILL 

## 2015-06-07 ENCOUNTER — Telehealth: Payer: Self-pay | Admitting: Cardiology

## 2015-06-07 NOTE — Telephone Encounter (Signed)
Patient has been having chest pain off and on for 1 week.  Going down into her back---she has been taking ASA for this.  Please call.
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
## 2015-08-04 ENCOUNTER — Encounter: Payer: Self-pay | Admitting: Cardiology

## 2015-08-04 ENCOUNTER — Ambulatory Visit (INDEPENDENT_AMBULATORY_CARE_PROVIDER_SITE_OTHER): Payer: Medicare Other | Admitting: Cardiology

## 2015-08-04 VITALS — BP 132/84 | HR 76 | Ht 61.0 in | Wt 106.0 lb

## 2015-08-04 DIAGNOSIS — R0789 Other chest pain: Secondary | ICD-10-CM

## 2015-08-04 DIAGNOSIS — I119 Hypertensive heart disease without heart failure: Secondary | ICD-10-CM | POA: Diagnosis not present

## 2015-08-04 DIAGNOSIS — I10 Essential (primary) hypertension: Secondary | ICD-10-CM

## 2015-08-04 DIAGNOSIS — I493 Ventricular premature depolarization: Secondary | ICD-10-CM | POA: Diagnosis not present
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
## 2015-08-04 NOTE — Patient Instructions (Signed)
Medication Instructions:  Your physician recommends that you continue on your current medications as directed. Please refer to the Current Medication list given to you today.  Please call to schedule your follow up for a physical.  Follow-Up: Follow up as needed with Dr Marlou Porch.  Thank you for choosing [facility]!!

## 2015-08-11 ENCOUNTER — Other Ambulatory Visit: Payer: Self-pay | Admitting: Adult Health

## 2015-08-15 ENCOUNTER — Other Ambulatory Visit: Payer: Self-pay | Admitting: Cardiology

## 2015-09-03 ENCOUNTER — Encounter: Payer: Self-pay | Admitting: Adult Health

## 2015-09-03 ENCOUNTER — Ambulatory Visit (INDEPENDENT_AMBULATORY_CARE_PROVIDER_SITE_OTHER): Payer: Medicare Other | Admitting: Adult Health

## 2015-09-03 VITALS — BP 152/80 | Temp 98.2°F | Ht 61.0 in | Wt 106.5 lb

## 2015-09-03 DIAGNOSIS — R634 Abnormal weight loss: Secondary | ICD-10-CM

## 2015-09-03 DIAGNOSIS — Z23 Encounter for immunization: Secondary | ICD-10-CM

## 2015-09-03 DIAGNOSIS — Z0000 Encounter for general adult medical examination without abnormal findings: Secondary | ICD-10-CM

## 2015-09-03 DIAGNOSIS — I10 Essential (primary) hypertension: Secondary | ICD-10-CM

## 2015-09-03 MED ORDER — HYDROCHLOROTHIAZIDE 25 MG PO TABS: 25.0000 mg | ORAL_TABLET | Freq: Every day | ORAL | Status: DC

## 2015-09-03 MED ORDER — MIRTAZAPINE 15 MG PO TABS: 7.5000 mg | ORAL_TABLET | Freq: Every day | ORAL | Status: DC

## 2015-09-03 NOTE — Patient Instructions (Signed)
It was great seeing you again.   Your blood pressure is up, I have increased your HCTZ to 25mg .   I have also sent in a prescription for Remeron to help increase your appetitie, I would like you take 1/2 pill at night. This medication will make you sleepy.   Come back in one month for a follow up.
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
## 2015-09-03 NOTE — Progress Notes (Signed)
Pre visit review using our clinic review tool, if applicable. No additional management support is needed unless otherwise documented below in the visit note. 

## 2015-09-09 ENCOUNTER — Encounter: Payer: Medicare Other | Admitting: Adult Health

## 2015-09-23 DIAGNOSIS — Z86010 Personal history of colonic polyps: Secondary | ICD-10-CM | POA: Diagnosis not present

## 2015-10-01 ENCOUNTER — Encounter: Payer: Self-pay | Admitting: Adult Health

## 2015-10-01 ENCOUNTER — Ambulatory Visit (INDEPENDENT_AMBULATORY_CARE_PROVIDER_SITE_OTHER): Payer: Medicare Other | Admitting: Adult Health

## 2015-10-01 VITALS — BP 110/68 | Temp 98.2°F | Ht 60.0 in | Wt 105.4 lb

## 2015-10-01 DIAGNOSIS — R634 Abnormal weight loss: Secondary | ICD-10-CM | POA: Diagnosis not present

## 2015-10-01 DIAGNOSIS — I10 Essential (primary) hypertension: Secondary | ICD-10-CM

## 2015-10-01 DIAGNOSIS — Z23 Encounter for immunization: Secondary | ICD-10-CM | POA: Diagnosis not present

## 2015-10-01 MED ORDER — LISINOPRIL 20 MG PO TABS: ORAL_TABLET | ORAL | Status: DC

## 2015-10-01 NOTE — Patient Instructions (Signed)
1) Increase the Remeron to 15mg  (1 pill) during the night, this will help you gain weight.  2) Stop taking the HCTZ 3) Take the lisinopril  4) Monitor your blood pressure and let me know if the top number is above 130 5) Eat the foods that you enjoy 6) Follow up in December for your physical

## 2015-10-01 NOTE — Progress Notes (Signed)
Subjective:    Patient ID: Amanda [name], female    DOB: 05-17-1933, 81 y.o.   MRN: [id number]  HPI  81 year old female who presents to the office today concerned about her weight loss.She endorses that she continues to lose weight. She eats a normal breakfast, does not always eat lunch and then has a good dinner.   She does not snack throughout the day. She has not issues with sleeping and she is not experiencing n/v/d/abdominal pain.   She was found to be taking two different doses of HCTZ. She as taking both 12.5 and 25 mg.   Wt Readings from Last 3 Encounters:  10/01/15 105 lb 6.4 oz (47.809 kg)  09/03/15 106 lb 8 oz (48.308 kg)  08/04/15 106 lb (48.081 kg)       Review of Systems  Constitutional: Negative.   HENT: Negative.   Respiratory: Negative.   Cardiovascular: Negative.   Genitourinary: Negative.   Musculoskeletal: Negative.   Neurological: Negative.    Past Medical History  Diagnosis Date  . Hypertension     Social History   Social History  . Marital Status: Divorced    Spouse Name: N/A  . Number of Children: N/A  . Years of Education: N/A   Occupational History  . Not on file.   Social History Main Topics  . Smoking status: Former Research scientist (life sciences)  . Smokeless tobacco: Not on file  . Alcohol Use: No  . Drug Use: No  . Sexual Activity: No   Other Topics Concern  . Not on file   Social History Narrative   Patient is a 81 year old AA female   Continues to work as Electrical engineer   Has a dog   Divorced 10 years ago.    Lives alone   Three children 1 boy two girls. One daughter lives close. Son and other daughter live in [place].       Goes to church           Past Surgical History  Procedure Laterality Date  . Partial hysterectomy      Family History  Problem Relation Age of Onset  . Healthy Mother   . Healthy Father   . Healthy Sister   . Healthy Brother   . Healthy Brother   . Healthy Brother     No Known Allergies  Current  Outpatient Prescriptions on File Prior to Visit  Medication Sig Dispense Refill  . aspirin 81 MG tablet Take 81 mg by mouth daily.    . nitroGLYCERIN (NITROSTAT) 0.4 MG SL tablet Place 1 tablet (0.4 mg total) under the tongue every 5 (five) minutes as needed for chest pain. 30 tablet 0   No current facility-administered medications on file prior to visit.    BP 110/68 mmHg  Temp(Src) 98.2 F (36.8 C) (Oral)  Ht 5' (1.524 m)  Wt 105 lb 6.4 oz (47.809 kg)  BMI 20.58 kg/m2       Objective:   Physical Exam  Constitutional: She is oriented to person, place, and time. She appears well-developed and well-nourished. No distress.  thin  Cardiovascular: Normal rate, regular rhythm, normal heart sounds and intact distal pulses.  Exam reveals no gallop and no friction rub.   No murmur heard. Pulmonary/Chest: Effort normal and breath sounds normal. No respiratory distress. She has no wheezes. She has no rales. She exhibits no tenderness.  Musculoskeletal: Normal range of motion. She exhibits no edema or tenderness.  Lymphadenopathy:  She has no cervical adenopathy.  Neurological: She is alert and oriented to person, place, and time.  Skin: Skin is warm and dry. No rash noted. She is not diaphoretic. No erythema. No pallor.  Psychiatric: She has a normal mood and affect. Her behavior is normal. Judgment and thought content normal.  Nursing note and vitals reviewed.     Assessment & Plan:  1. Loss of weight - Increase Remeron from 7.5 to 15 mg  - No dietary restrictions - Add Ensure or Boost to the diet - BMP with eGFR - Will release future labs that she never had done including CBC,TSH and liver panel.   2. Need for pneumococcal vaccination  - Pneumococcal conjugate vaccine 13-valent IM  3. Essential hypertension - D/c HCTZ - lisinopril (PRINIVIL,ZESTRIL) 20 MG tablet; TAKE 1/2 TABLET(10 MG) BY MOUTH DAILY  Dispense: 90 tablet; Refill: 1 - Follow up in one month

## 2015-10-25 ENCOUNTER — Ambulatory Visit (INDEPENDENT_AMBULATORY_CARE_PROVIDER_SITE_OTHER): Payer: Medicare Other | Admitting: Adult Health

## 2015-10-25 ENCOUNTER — Encounter: Payer: Self-pay | Admitting: Adult Health

## 2015-10-25 VITALS — BP 160/100 | Temp 97.6°F | Ht 60.0 in | Wt 106.8 lb

## 2015-10-25 DIAGNOSIS — R634 Abnormal weight loss: Secondary | ICD-10-CM

## 2015-10-25 DIAGNOSIS — E785 Hyperlipidemia, unspecified: Secondary | ICD-10-CM | POA: Insufficient documentation

## 2015-10-25 DIAGNOSIS — I10 Essential (primary) hypertension: Secondary | ICD-10-CM | POA: Diagnosis not present

## 2015-10-25 DIAGNOSIS — Z0000 Encounter for general adult medical examination without abnormal findings: Secondary | ICD-10-CM

## 2015-10-25 DIAGNOSIS — E2839 Other primary ovarian failure: Secondary | ICD-10-CM | POA: Diagnosis not present

## 2015-10-25 MED ORDER — LISINOPRIL 5 MG PO TABS: 5.0000 mg | ORAL_TABLET | Freq: Every day | ORAL | Status: DC

## 2015-10-25 NOTE — Progress Notes (Signed)
Subjective:  Patient presents today for their annual wellness visit.  She is a pleasant AA female who  has a past medical history of Hypertension. Her biggest concern today is that of weight gain. She is worried because she is not gaining much weight. She has started taking Remeron 7.5 mg and feels as though her appetite has not increased much. She denies any loss of appetite and is eating throughout the day. She continues to be active and works multiple days a week.    Preventive Screening-Counseling & Management  Smoking Status:Former smoker Second Hand Smoking status: No smokers in home  Risk Factors Regular exercise: None Diet: Does not follow a diet Fall Risk:None   Cardiac risk factors:  advanced age (older than 59 for men, 40 for women)  Hyperlipidemia  No diabetes. Family History:   Depression Screen None. PHQ2 0   Activities of Daily Living Independent ADLs and IADLs  Hearing Difficulties: patient declines  Cognitive Testing No reported trouble.   Normal 3 word recall  List the Names of Other Physician/Practitioners you currently use: 1. Cardiology   Immunization History  Administered Date(s) Administered  . Influenza, High Dose Seasonal PF 09/03/2015  . Influenza,inj,Quad PF,36+ Mos 01/06/2015  . Pneumococcal Conjugate-13 10/01/2015  . Tdap 10/25/2011   Required Immunizations needed today: Shingles, does not want at this time  Screening tests- up to date Health Maintenance Due  Topic Date Due  . DEXA SCAN  07/23/2000    ROS- No pertinent positives discovered in course of AWV  The following were reviewed and entered/updated in epic: Past Medical History  Diagnosis Date  . Hypertension    Patient Active Problem List   Diagnosis Date Noted  . Hyperlipidemia 10/25/2015  . PVC (premature ventricular contraction) 01/21/2015  . Hypertensive cardiovascular disease 01/21/2015  . Chest pain 01/05/2015  . HTN (hypertension) 01/05/2015    Past Surgical History  Procedure Laterality Date  . Partial hysterectomy      Family History  Problem Relation Age of Onset  . Healthy Mother   . Healthy Father   . Healthy Sister   . Healthy Brother   . Healthy Brother   . Healthy Brother     Medications- reviewed and updated Current Outpatient Prescriptions  Medication Sig Dispense Refill  . aspirin 81 MG tablet Take 81 mg by mouth daily.    Marland Kitchen lisinopril (PRINIVIL,ZESTRIL) 20 MG tablet TAKE 1/2 TABLET(10 MG) BY MOUTH DAILY 90 tablet 1  . nitroGLYCERIN (NITROSTAT) 0.4 MG SL tablet Place 1 tablet (0.4 mg total) under the tongue every 5 (five) minutes as needed for chest pain. 30 tablet 0  . lisinopril (PRINIVIL,ZESTRIL) 5 MG tablet Take 1 tablet (5 mg total) by mouth daily. 90 tablet 3  . mirtazapine (REMERON) 15 MG tablet   3   No current facility-administered medications for this visit.    Allergies-reviewed and updated No Known Allergies  Social History   Social History  . Marital Status: Divorced    Spouse Name: N/A  . Number of Children: N/A  . Years of Education: N/A   Social History Main Topics  . Smoking status: Former Research scientist (life sciences)  . Smokeless tobacco: None  . Alcohol Use: No  . Drug Use: No  . Sexual Activity: No   Other Topics Concern  . None   Social History Narrative   Patient is a 81 year old AA female   Continues to work as Electrical engineer   Has a dog   Divorced  10 years ago.    Lives alone   Three children 1 boy two girls. One daughter lives close. Son and other daughter live in [place].       Goes to church           Objective: BP 160/100 mmHg  Temp(Src) 97.6 F (36.4 C) (Oral)  Ht 5' (1.524 m)  Wt 106 lb 12.8 oz (48.444 kg)  BMI 20.86 kg/m2   GENERAL: vitals reviewed and listed above, alert, oriented, appears well hydrated and in no acute distress. She is thin in stature  HEENT: atraumatic, conjunttiva clear, no obvious abnormalities on inspection of external nose and ears  NECK:  no obvious masses on inspection  LUNGS: clear to auscultation bilaterally, no wheezes, rales or rhonchi, good air movement  CV: HRRR, no peripheral edema  Breast: No lumps, masses, dimpling or discharge  MS: moves all extremities without noticeable abnormality. + Kyphosis   PSYCH: pleasant and cooperative, no obvious depression or anxiety  Assessment/Plan:   1. Medicare annual wellness visit, subsequent - Follow up in one year for MWE - Follow up sooner if needed  2. Essential hypertension - Blood pressure elevated today. Will add 5 mg lisinopril to her regimen.  - lisinopril (PRINIVIL,ZESTRIL) 5 MG tablet; Take 1 tablet (5 mg total) by mouth daily.  Dispense: 90 tablet; Refill: 3 - Basic metabolic panel - Hepatic function panel - POCT urinalysis dipstick - CBC with Differential/Platelet - Follow up in one month  3. Loss of weight - Likely from diet. She appears to snack throughout the day but does not eat big meals. She is very active for her age. Advised to had high calorie foods to her diet. Have ensure multiple times per day - Increase Remeron from 7.5 mg to 15 mg - Basic metabolic panel - Hepatic function panel - POCT urinalysis dipstick - TSH - CBC with Differential/Platelet - DG Bone Density; Future  4. Estrogen deficiency - DG Bone Density; Future

## 2015-10-25 NOTE — Patient Instructions (Signed)
It was great seeing you today!  1) Take one full pill of Remeron  2) Take an additional 5 mg of Lisinopril with your 20 mg of lisinopril  3) Add two Ensure drinks to your diet each day 4) I will call you about labs 5) Someone will call you to schedule the bone density screen

## 2015-12-28 ENCOUNTER — Encounter: Payer: Self-pay | Admitting: Adult Health

## 2015-12-28 ENCOUNTER — Ambulatory Visit (INDEPENDENT_AMBULATORY_CARE_PROVIDER_SITE_OTHER): Payer: Medicare Other | Admitting: Adult Health

## 2015-12-28 VITALS — BP 100/80 | HR 65 | Temp 98.6°F | Ht 60.0 in | Wt 103.7 lb

## 2015-12-28 DIAGNOSIS — N3281 Overactive bladder: Secondary | ICD-10-CM

## 2015-12-28 DIAGNOSIS — R1084 Generalized abdominal pain: Secondary | ICD-10-CM

## 2015-12-28 DIAGNOSIS — R634 Abnormal weight loss: Secondary | ICD-10-CM

## 2015-12-28 LAB — CBC WITH DIFFERENTIAL/PLATELET
BASOS PCT: 0.9 % (ref 0.0–3.0)
Basophils Absolute: 0 10*3/uL (ref 0.0–0.1)
EOS ABS: 0.1 10*3/uL (ref 0.0–0.7)
EOS PCT: 1.7 % (ref 0.0–5.0)
HEMATOCRIT: 35.3 % — AB (ref 36.0–46.0)
Hemoglobin: 11.2 g/dL — ABNORMAL LOW (ref 12.0–15.0)
LYMPHS PCT: 35.9 % (ref 12.0–46.0)
Lymphs Abs: 1.7 10*3/uL (ref 0.7–4.0)
MCHC: 31.8 g/dL (ref 30.0–36.0)
MCV: 87.9 fl (ref 78.0–100.0)
Monocytes Absolute: 0.7 10*3/uL (ref 0.1–1.0)
Monocytes Relative: 14.9 % — ABNORMAL HIGH (ref 3.0–12.0)
Neutro Abs: 2.2 10*3/uL (ref 1.4–7.7)
Neutrophils Relative %: 46.6 % (ref 43.0–77.0)
Platelets: 388 10*3/uL (ref 150.0–400.0)
RBC: 4.01 Mil/uL (ref 3.87–5.11)
RDW: 17.7 % — AB (ref 11.5–15.5)
WBC: 4.7 10*3/uL (ref 4.0–10.5)

## 2015-12-28 LAB — POC URINALSYSI DIPSTICK (AUTOMATED)
Bilirubin, UA: NEGATIVE
Glucose, UA: NEGATIVE
KETONES UA: NEGATIVE
Nitrite, UA: NEGATIVE
Protein, UA: NEGATIVE
RBC UA: NEGATIVE
SPEC GRAV UA: 1.025
Urobilinogen, UA: 0.2
pH, UA: 6

## 2015-12-28 LAB — HEPATIC FUNCTION PANEL
ALK PHOS: 48 U/L (ref 39–117)
ALT: 13 U/L (ref 0–35)
AST: 18 U/L (ref 0–37)
Albumin: 4.1 g/dL (ref 3.5–5.2)
BILIRUBIN DIRECT: 0.1 mg/dL (ref 0.0–0.3)
Total Bilirubin: 0.3 mg/dL (ref 0.2–1.2)
Total Protein: 7.2 g/dL (ref 6.0–8.3)

## 2015-12-28 LAB — BASIC METABOLIC PANEL
BUN: 13 mg/dL (ref 6–23)
CO2: 31 meq/L (ref 19–32)
CREATININE: 0.66 mg/dL (ref 0.40–1.20)
Calcium: 10.1 mg/dL (ref 8.4–10.5)
Chloride: 102 mEq/L (ref 96–112)
GFR: 110.7 mL/min (ref >60.00)
GLUCOSE: 81 mg/dL (ref 70–99)
Potassium: 4.2 mEq/L (ref 3.5–5.1)
Sodium: 140 mEq/L (ref 135–145)

## 2015-12-28 LAB — TSH: TSH: 0.84 u[IU]/mL (ref 0.35–4.50)

## 2015-12-28 MED ORDER — MIRABEGRON ER 25 MG PO TB24: 25.0000 mg | ORAL_TABLET | Freq: Every day | ORAL | Status: DC
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
## 2015-12-28 NOTE — Patient Instructions (Addendum)
It was great seeing you again today!  1. Schedule your appointment for your bone scan  2. Gets your labs done  3. Someone will call you to schedule your ultrasound of the stomach  4. I have called in a medication called Myrbetriq - this is for your overactive bladder  5. Let us know what medications you are missing  6. Take Remeron nightly to help with your appetite.   7. Follow up with me in 1 month

## 2015-12-28 NOTE — Progress Notes (Signed)
Pre visit review using our clinic review tool, if applicable. No additional management support is needed unless otherwise documented below in the visit note. 

## 2015-12-29 ENCOUNTER — Other Ambulatory Visit: Payer: Medicare Other

## 2015-12-30 ENCOUNTER — Inpatient Hospital Stay: Admission: RE | Admit: 2015-12-30 | Payer: Medicare Other | Source: Ambulatory Visit

## 2016-01-05 ENCOUNTER — Ambulatory Visit (INDEPENDENT_AMBULATORY_CARE_PROVIDER_SITE_OTHER)
Admission: RE | Admit: 2016-01-05 | Discharge: 2016-01-05 | Disposition: A | Discharge status: Home / Self Care | Payer: Medicare Other | Source: Ambulatory Visit | Attending: Adult Health | Admitting: Adult Health

## 2016-01-05 DIAGNOSIS — E2839 Other primary ovarian failure: Secondary | ICD-10-CM

## 2016-01-05 DIAGNOSIS — R634 Abnormal weight loss: Secondary | ICD-10-CM

## 2016-01-07 ENCOUNTER — Ambulatory Visit
Admission: RE | Admit: 2016-01-07 | Discharge: 2016-01-07 | Disposition: A | Discharge status: Home / Self Care | Payer: Medicare Other | Source: Ambulatory Visit | Attending: Adult Health | Admitting: Adult Health

## 2016-01-07 DIAGNOSIS — R1084 Generalized abdominal pain: Secondary | ICD-10-CM

## 2016-01-07 DIAGNOSIS — R634 Abnormal weight loss: Secondary | ICD-10-CM

## 2016-01-07 DIAGNOSIS — R109 Unspecified abdominal pain: Secondary | ICD-10-CM | POA: Diagnosis not present

## 2016-01-24 ENCOUNTER — Ambulatory Visit: Payer: Medicare Other | Admitting: Adult Health

## 2016-02-18 ENCOUNTER — Encounter: Payer: Self-pay | Admitting: Adult Health

## 2016-02-18 ENCOUNTER — Other Ambulatory Visit: Payer: Self-pay | Admitting: Family Medicine

## 2016-02-18 ENCOUNTER — Ambulatory Visit (INDEPENDENT_AMBULATORY_CARE_PROVIDER_SITE_OTHER): Payer: Medicare Other | Admitting: Adult Health

## 2016-02-18 VITALS — BP 180/88 | Temp 97.9°F | Ht 60.0 in | Wt 100.8 lb

## 2016-02-18 DIAGNOSIS — N3281 Overactive bladder: Secondary | ICD-10-CM | POA: Diagnosis not present

## 2016-02-18 DIAGNOSIS — R634 Abnormal weight loss: Secondary | ICD-10-CM | POA: Diagnosis not present

## 2016-02-18 DIAGNOSIS — I10 Essential (primary) hypertension: Secondary | ICD-10-CM | POA: Diagnosis not present

## 2016-02-18 MED ORDER — NITROGLYCERIN 0.4 MG SL SUBL: 0.4000 mg | SUBLINGUAL_TABLET | SUBLINGUAL | Status: DC | PRN

## 2016-02-18 MED ORDER — LISINOPRIL 20 MG PO TABS: 20.0000 mg | ORAL_TABLET | Freq: Every day | ORAL | Status: DC

## 2016-02-18 MED ORDER — MIRABEGRON ER 25 MG PO TB24: 25.0000 mg | ORAL_TABLET | Freq: Every day | ORAL | Status: DC

## 2016-02-18 MED ORDER — MIRTAZAPINE 15 MG PO TABS: 15.0000 mg | ORAL_TABLET | Freq: Every day | ORAL | Status: DC

## 2016-02-18 NOTE — Patient Instructions (Signed)
It was great seeing you again!  I have printed off all your prescriptions after leaving the office today I want you to go to the pharmacy and get your prescriptions filled. Take your blood pressure medication(Lisinopril) when you get it, then start the following morning on a regular basis  Use all medications as directed  Follow-up with me in one week
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
## 2016-02-25 ENCOUNTER — Ambulatory Visit: Payer: Medicare Other | Admitting: Adult Health

## 2016-02-25 DIAGNOSIS — Z0289 Encounter for other administrative examinations: Secondary | ICD-10-CM

## 2016-02-28 ENCOUNTER — Ambulatory Visit (HOSPITAL_COMMUNITY)
Admission: EM | Admit: 2016-02-28 | Discharge: 2016-02-28 | Disposition: A | Discharge status: Home / Self Care | Payer: Medicare Other | Attending: Emergency Medicine | Admitting: Emergency Medicine

## 2016-02-28 ENCOUNTER — Encounter (HOSPITAL_COMMUNITY): Payer: Self-pay | Admitting: Emergency Medicine

## 2016-02-28 DIAGNOSIS — T148 Other injury of unspecified body region: Secondary | ICD-10-CM

## 2016-02-28 DIAGNOSIS — M25511 Pain in right shoulder: Secondary | ICD-10-CM

## 2016-02-28 DIAGNOSIS — M25512 Pain in left shoulder: Secondary | ICD-10-CM

## 2016-02-28 DIAGNOSIS — T148XXA Other injury of unspecified body region, initial encounter: Secondary | ICD-10-CM

## 2016-02-28 MED ORDER — TRIAMCINOLONE ACETONIDE 40 MG/ML IJ SUSP
INTRAMUSCULAR | Status: AC
  Filled 2016-02-28: qty 1

## 2016-02-28 MED ORDER — DICLOFENAC SODIUM 1 % TD GEL: 1.0000 "application " | Freq: Four times a day (QID) | TRANSDERMAL | Status: DC

## 2016-02-28 MED ORDER — TRIAMCINOLONE ACETONIDE 40 MG/ML IJ SUSP
20.0000 mg | Freq: Once | INTRAMUSCULAR | Status: AC
  Administered 2016-02-28: 20 mg via INTRAMUSCULAR

## 2016-02-28 NOTE — ED Provider Notes (Signed)
CSN: [id number]     Arrival date & time 02/28/16  1542 History   First MD Initiated Contact with Patient 02/28/16 1752     Chief Complaint  Patient presents with  . Shoulder Pain   (Consider location/radiation/quality/duration/timing/severity/associated sxs/prior Treatment) HPI Comments: 82 year old female complaining of aching bones in her shoulders for 2 weeks. The pain is worse with movement and use. 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 She is cleaning the house, mopping, sweeping and vacuuming on a daily basis. Pain started about 2 weeks ago. Pain is located primarily to the muscles of the shoulder including trapezius, pectoralis and periscapular musculature. She is fully awake, alert, active, aware, ambulatory and in no acute distress.   Past Medical History  Diagnosis Date  . Hypertension   . PVC (premature ventricular contraction)   . Hyperlipidemia    Past Surgical History  Procedure Laterality Date  . Partial hysterectomy     Family History  Problem Relation Age of Onset  . Healthy Mother   . Healthy Father   . Healthy Sister   . Healthy Brother   . Healthy Brother   . Healthy Brother    Social History  Substance Use Topics  . Smoking status: Former Research scientist (life sciences)  . Smokeless tobacco: None  . Alcohol Use: No   OB History    No data available     Review of Systems  Constitutional: Positive for activity change and unexpected weight change. Negative for fever.  HENT: Negative.   Respiratory: Negative.   Cardiovascular: Negative.   Gastrointestinal: Negative.   Musculoskeletal: Positive for myalgias and back pain. Negative for neck pain.  Skin: Negative.   Neurological: Positive for dizziness.    Allergies  Review of patient's allergies indicates no known allergies.  Home Medications   Prior to Admission medications   Medication Sig Start Date End Date Taking? Authorizing Provider  aspirin 81 MG tablet Take 81 mg by mouth daily.  Reported on 12/28/2015    Historical Provider, MD  diclofenac sodium (VOLTAREN) 1 % GEL Apply 1 application topically 4 (four) times daily. 02/28/16   Janne Napoleon, NP  lisinopril (PRINIVIL,ZESTRIL) 20 MG tablet Take 1 tablet (20 mg total) by mouth daily. 02/18/16   Dorothyann Peng, NP  mirabegron ER (MYRBETRIQ) 25 MG TB24 tablet Take 1 tablet (25 mg total) by mouth daily. 02/18/16   Dorothyann Peng, NP  mirtazapine (REMERON) 15 MG tablet Take 1 tablet (15 mg total) by mouth at bedtime. Reported on 02/18/2016 02/18/16   Dorothyann Peng, NP  nitroGLYCERIN (NITROSTAT) 0.4 MG SL tablet PLACE 1 TABLET UNDER THE TONGUE EVERY 5 MINUTES AS NEEDED FOR CHEST PAIN 02/21/16   Dorothyann Peng, NP   Meds Ordered and Administered this Visit   Medications  triamcinolone acetonide (KENALOG-40) injection 20 mg (not administered)    BP 176/86 mmHg  Pulse 74  Temp(Src) 97.7 F (36.5 C) (Oral)  Resp 16  SpO2 97% No data found.   Physical Exam  Constitutional: She is oriented to person, place, and time. She appears well-developed and well-nourished.  Eyes: EOM are normal.  Neck: Normal range of motion. Neck supple.  Cardiovascular: Normal rate, regular rhythm and normal heart sounds.   Pulmonary/Chest: Effort normal and breath sounds normal. No respiratory distress.  Musculoskeletal:  There is tenderness to bilateral shoulder musculature critically along the trapezius ridge and posterior trapezius bilaterally including the supra and infra scapularis and upper parathoracic musculature. Movement of the arms reproduced the pain.  Abduction and both arms is limited to approximately 100. No shoulder joint tenderness. No tenderness to the deltoid muscles. No apparent swelling or discoloration. No shoulder asymmetry. No bony tenderness.  Lymphadenopathy:    She has no cervical adenopathy.  Neurological: She is alert and oriented to person, place, and time. No cranial nerve deficit. She exhibits normal muscle tone.  Skin: Skin is  warm and dry.  Psychiatric: She has a normal mood and affect.  Nursing note and vitals reviewed.   ED Course  Procedures (including critical care time)  Labs Review Labs Reviewed - No data to display  Imaging Review No results found.   Visual Acuity Review  Right Eye Distance:   Left Eye Distance:   Bilateral Distance:    Right Eye Near:   Left Eye Near:    Bilateral Near:         MDM   1. Muscle strain   2. Bilateral shoulder pain    Meds ordered this encounter  Medications  . triamcinolone acetonide (KENALOG-40) injection 20 mg    Sig:   . diclofenac sodium (VOLTAREN) 1 % GEL    Sig: Apply 1 application topically 4 (four) times daily.    Dispense:  100 g    Refill:  0    Order Specific Question:  Supervising Provider    Answer:  Melony Overly [id number]   Apply heat to sore muscles. Limited use of the arms and the amount of work that you are doing. Apply the diclofenac gel 4 times a day as directed. Follow-up with your primary care doctor. Call tomorrow for an appointment. On her last visit you are supposed to have a follow-up for blood pressure check and additional evaluation for weight loss.    Janne Napoleon, NP 02/28/16 2002

## 2016-02-28 NOTE — Discharge Instructions (Signed)
Heat Therapy °Heat therapy can help ease sore, stiff, injured, and tight muscles and joints. Heat relaxes your muscles, which may help ease your pain. Heat therapy should only be used on old, pre-existing, or long-lasting (chronic) injuries. Do not use heat therapy unless told by your doctor. °HOW TO USE HEAT THERAPY °There are several different kinds of heat therapy, including: °· Moist heat pack. °· Warm water bath. °· Hot water bottle. °· Electric heating pad. °· Heated gel pack. °· Heated wrap. °· Electric heating pad. °GENERAL HEAT THERAPY RECOMMENDATIONS  °· Do not sleep while using heat therapy. Only use heat therapy while you are awake. °· Your skin may turn pink while using heat therapy. Do not use heat therapy if your skin turns red. °· Do not use heat therapy if you have new pain. °· High heat or long exposure to heat can cause burns. Be careful when using heat therapy to avoid burning your skin. °· Do not use heat therapy on areas of your skin that are already irritated, such as with a rash or sunburn. °GET HELP IF:  °· You have blisters, redness, swelling (puffiness), or numbness. °· You have new pain. °· Your pain is worse. °MAKE SURE YOU: °· Understand these instructions. °· Will watch your condition. °· Will get help right away if you are not doing well or get worse. °  °This information is not intended to replace advice given to you by your health care provider. Make sure you discuss any questions you have with your health care provider. °  °Document Released: 01/29/2012 Document Revised: 11/27/2014 Document Reviewed: 12/30/2013 °Elsevier Interactive Patient Education ©2016 [employer] ° °

## 2016-02-28 NOTE — ED Notes (Signed)
Here with bilateral shoulder and neck pain that started 2 weeks ago Unable to lift arms up fully Poor appetite and weight loss reported  BP elevated

## 2016-03-01 ENCOUNTER — Encounter: Payer: Self-pay | Admitting: Adult Health

## 2016-03-01 ENCOUNTER — Ambulatory Visit (INDEPENDENT_AMBULATORY_CARE_PROVIDER_SITE_OTHER): Payer: Medicare Other | Admitting: Adult Health

## 2016-03-01 VITALS — BP 160/80 | Temp 98.1°F | Wt 101.1 lb

## 2016-03-01 DIAGNOSIS — R634 Abnormal weight loss: Secondary | ICD-10-CM

## 2016-03-01 DIAGNOSIS — I10 Essential (primary) hypertension: Secondary | ICD-10-CM

## 2016-03-01 LAB — CBC WITH DIFFERENTIAL/PLATELET
BASOS ABS: 0 10*3/uL (ref 0.0–0.1)
BASOS PCT: 0.9 % (ref 0.0–3.0)
EOS ABS: 0.1 10*3/uL (ref 0.0–0.7)
Eosinophils Relative: 2.1 % (ref 0.0–5.0)
HEMATOCRIT: 33 % — AB (ref 36.0–46.0)
HEMOGLOBIN: 10.7 g/dL — AB (ref 12.0–15.0)
LYMPHS PCT: 31.9 % (ref 12.0–46.0)
Lymphs Abs: 1.7 10*3/uL (ref 0.7–4.0)
MCHC: 32.6 g/dL (ref 30.0–36.0)
MCV: 83.7 fl (ref 78.0–100.0)
MONOS PCT: 9.7 % (ref 3.0–12.0)
Monocytes Absolute: 0.5 10*3/uL (ref 0.1–1.0)
Neutro Abs: 2.9 10*3/uL (ref 1.4–7.7)
Neutrophils Relative %: 55.4 % (ref 43.0–77.0)
Platelets: 324 10*3/uL (ref 150.0–400.0)
RBC: 3.94 Mil/uL (ref 3.87–5.11)
RDW: 17.1 % — AB (ref 11.5–15.5)
WBC: 5.3 10*3/uL (ref 4.0–10.5)

## 2016-03-01 LAB — BASIC METABOLIC PANEL
BUN: 15 mg/dL (ref 6–23)
CHLORIDE: 105 meq/L (ref 96–112)
CO2: 27 mEq/L (ref 19–32)
CREATININE: 0.67 mg/dL (ref 0.40–1.20)
Calcium: 9.8 mg/dL (ref 8.4–10.5)
GFR: 108.75 mL/min (ref >60.00)
Glucose, Bld: 100 mg/dL — ABNORMAL HIGH (ref 70–99)
Potassium: 3.8 mEq/L (ref 3.5–5.1)
SODIUM: 141 meq/L (ref 135–145)

## 2016-03-01 LAB — C-REACTIVE PROTEIN: CRP: 0.1 mg/dL — ABNORMAL LOW (ref 0.5–20.0)

## 2016-03-01 LAB — TSH: TSH: 0.88 u[IU]/mL (ref 0.35–4.50)

## 2016-03-01 LAB — SEDIMENTATION RATE: SED RATE: 34 mm/h — AB (ref 0–22)

## 2016-03-01 MED ORDER — LISINOPRIL 40 MG PO TABS: 40.0000 mg | ORAL_TABLET | Freq: Every day | ORAL | Status: DC

## 2016-03-01 NOTE — Patient Instructions (Signed)
It was great seeing you today!  I have sent in another prescription for Lisinoipril, take two pills of what you have now and then start the new prescription.   I will follow up with you regarding your labs  Get your chest x ray tomorrow at the [facility] office

## 2016-03-01 NOTE — Progress Notes (Deleted)
BP Readings from Last 3 Encounters:  03/01/16 160/80  02/28/16 176/86  02/18/16 180/88

## 2016-03-01 NOTE — Progress Notes (Signed)
   Subjective:    Patient ID: Amanda [name], female    DOB: 1933-04-03, 82 y.o.   MRN: [id number]  HPI  82 year old female who presents to the office today for follow up regarding ER visit for pain across the shoulder blades x 2 weeks. The pain was worse with movement and use. She was prescribed Voltaren gel and reports that her pain has almost resolved.   She is worried because " the doctor at the ER mentioned something about cancer, because of my weight loss."   She has been losing weight for some time now and I have prescribed her Rameron 15 mg in the past which she endorses taking. She has a good appetite and is eating well, despite this she is not gaining weight.   She has a recent US of abdomen which was inconclusive. Also had a chest xray in 12/2014 which was negative.     Her blood pressure continues to be elevated today at BP: (!) 160/80 mmHg Currently she is taking 20 mg lisinopril.      Review of Systems  Constitutional: Negative.   Respiratory: Negative.   Cardiovascular: Negative.   Musculoskeletal: Positive for myalgias and arthralgias. Negative for back pain, joint swelling, gait problem, neck pain and neck stiffness.  Skin: Negative.   Neurological: Negative.   All other systems reviewed and are negative.      Objective:   Physical Exam  Constitutional: She is oriented to person, place, and time.  Cardiovascular: Normal rate, regular rhythm, normal heart sounds and intact distal pulses.  Exam reveals no gallop and no friction rub.   No murmur heard. Pulmonary/Chest: Effort normal and breath sounds normal. No respiratory distress. She has no wheezes. She has no rales. She exhibits no tenderness.  Neurological: She is alert and oriented to person, place, and time. She has normal reflexes.  Skin: Skin is warm and dry. No rash noted. No erythema. No pallor.  Psychiatric: She has a normal mood and affect. Her behavior is normal. Judgment and thought content normal.    Nursing note and vitals reviewed.     Assessment & Plan:  1. Weight loss, unintentional - DG Chest 2 View; Future - Basic metabolic panel - CBC with Differential - C-reactive Protein - Sedimentation Rate - TSH - HIV antibody - Will consider CT 2. Essential hypertension - lisinopril (PRINIVIL,ZESTRIL) 40 MG tablet; Take 1 tablet (40 mg total) by mouth daily.  Dispense: 90 tablet; Refill: 1  Dorothyann Peng, NP

## 2016-03-02 ENCOUNTER — Telehealth: Payer: Self-pay | Admitting: Adult Health

## 2016-03-02 LAB — HIV ANTIBODY (ROUTINE TESTING W REFLEX): HIV 1&2 Ab, 4th Generation: NONREACTIVE

## 2016-03-02 NOTE — Telephone Encounter (Signed)
Left voicemail informing patient that her labs are normal

## 2016-04-25 ENCOUNTER — Ambulatory Visit: Payer: Medicare Other | Admitting: Adult Health

## 2016-04-27 ENCOUNTER — Ambulatory Visit (INDEPENDENT_AMBULATORY_CARE_PROVIDER_SITE_OTHER): Payer: Medicare Other | Admitting: Adult Health

## 2016-04-27 ENCOUNTER — Other Ambulatory Visit: Payer: Self-pay | Admitting: Adult Health

## 2016-04-27 ENCOUNTER — Encounter: Payer: Self-pay | Admitting: Adult Health

## 2016-04-27 VITALS — BP 142/80 | Temp 98.2°F | Wt 102.0 lb

## 2016-04-27 DIAGNOSIS — M25511 Pain in right shoulder: Secondary | ICD-10-CM | POA: Diagnosis not present

## 2016-04-27 DIAGNOSIS — M25512 Pain in left shoulder: Secondary | ICD-10-CM | POA: Diagnosis not present

## 2016-04-27 MED ORDER — MELOXICAM 7.5 MG PO TABS: 7.5000 mg | ORAL_TABLET | Freq: Every day | ORAL | Status: DC

## 2016-04-27 NOTE — Progress Notes (Signed)
   Subjective:    Patient ID: Amanda [name], female    DOB: [birth year], 82 y.o.   MRN: [id number]  HPI   82 year old female who presents to the office today for follow up an " aching" pain in her shoulders. She has had this pain for the last few months. She has recently been seen in urgent care for this issue and was prescribed Voltaren gel, which she reports works well but does not work long enough.   The pain is worse with movement and use. The pain is located primarily in the Pain is located primarily to the muscles of the shoulder including trapezius, pectoralis and periscapular musculature.   She continues to be active. 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 Amanda [name] is doing well with the death.   Review of Systems  Respiratory: Negative.   Cardiovascular: Negative.   Musculoskeletal: Positive for myalgias, back pain, arthralgias and neck pain. Negative for joint swelling and gait problem.  Skin: Negative.   All other systems reviewed and are negative.      Objective:   Physical Exam  Constitutional: She is oriented to person, place, and time. She appears well-developed and well-nourished. No distress.  Cardiovascular: Normal rate, regular rhythm, normal heart sounds and intact distal pulses.  Exam reveals no gallop and no friction rub.   No murmur heard. Pulmonary/Chest: Effort normal and breath sounds normal. No respiratory distress. She has no wheezes. She has no rales. She exhibits no tenderness.  Musculoskeletal:  There is tenderness to bilateral shoulder musculature  along the trapezius ridge and posterior trapezius bilaterally including the supra and infra scapularis and upper parathoracic musculature. Movement of the arms reproduced the pain. Abduction and both arms is limited to approximately half way. No shoulder joint tenderness. There is  tenderness to the deltoid muscles. No apparent swelling or discoloration. No shoulder  asymmetry.   Neurological: She is alert and oriented to person, place, and time.  Skin: Skin is warm and dry. No rash noted. She is not diaphoretic. No erythema. No pallor.  Psychiatric: She has a normal mood and affect. Her behavior is normal. Judgment and thought content normal.  Nursing note and vitals reviewed.     Assessment & Plan:  1. Bilateral shoulder pain - D/c Voltaren  - me loxicam (MOBIC) 7.5 MG tablet; Take 1 tablet (7.5 mg total) by mouth daily.  Dispense: 30 tablet; Refill: 0 - Ok to use sports creams and heating pads.  - Follow up if no improvement   Dorothyann Peng, NP

## 2016-04-27 NOTE — Patient Instructions (Signed)
It was great seeing you again   I have sent in a new prescription for a medication called Mobic. This will be for the bone and muscle pain.   I want you to stop using the Voltaren gel.   If you want you can add a muscle rub such as [employer]. Wash your hands after using it.   Let me know if you do not notice a difference in your pain in the next few days.

## 2016-06-14 DIAGNOSIS — H40013 Open angle with borderline findings, low risk, bilateral: Secondary | ICD-10-CM | POA: Diagnosis not present

## 2016-06-14 DIAGNOSIS — Z961 Presence of intraocular lens: Secondary | ICD-10-CM | POA: Diagnosis not present

## 2016-06-14 DIAGNOSIS — H26493 Other secondary cataract, bilateral: Secondary | ICD-10-CM | POA: Diagnosis not present

## 2016-06-22 DIAGNOSIS — H26493 Other secondary cataract, bilateral: Secondary | ICD-10-CM | POA: Diagnosis not present

## 2016-08-22 DIAGNOSIS — Z23 Encounter for immunization: Secondary | ICD-10-CM | POA: Diagnosis not present

## 2016-08-22 DIAGNOSIS — Z0000 Encounter for general adult medical examination without abnormal findings: Secondary | ICD-10-CM | POA: Diagnosis not present

## 2016-08-22 DIAGNOSIS — Z1389 Encounter for screening for other disorder: Secondary | ICD-10-CM | POA: Diagnosis not present

## 2016-08-22 DIAGNOSIS — I10 Essential (primary) hypertension: Secondary | ICD-10-CM | POA: Diagnosis not present

## 2016-08-22 DIAGNOSIS — R630 Anorexia: Secondary | ICD-10-CM | POA: Diagnosis not present

## 2016-08-23 DIAGNOSIS — I10 Essential (primary) hypertension: Secondary | ICD-10-CM | POA: Diagnosis not present

## 2016-08-23 DIAGNOSIS — R630 Anorexia: Secondary | ICD-10-CM | POA: Diagnosis not present

## 2016-08-23 DIAGNOSIS — Z0000 Encounter for general adult medical examination without abnormal findings: Secondary | ICD-10-CM | POA: Diagnosis not present

## 2016-09-15 DIAGNOSIS — D649 Anemia, unspecified: Secondary | ICD-10-CM | POA: Diagnosis not present

## 2016-09-15 DIAGNOSIS — I10 Essential (primary) hypertension: Secondary | ICD-10-CM | POA: Diagnosis not present

## 2016-09-19 DIAGNOSIS — G8929 Other chronic pain: Secondary | ICD-10-CM | POA: Diagnosis not present

## 2016-09-19 DIAGNOSIS — M25512 Pain in left shoulder: Secondary | ICD-10-CM | POA: Diagnosis not present

## 2016-09-19 DIAGNOSIS — I10 Essential (primary) hypertension: Secondary | ICD-10-CM | POA: Diagnosis not present

## 2016-09-19 DIAGNOSIS — R630 Anorexia: Secondary | ICD-10-CM | POA: Diagnosis not present

## 2016-09-19 DIAGNOSIS — M25511 Pain in right shoulder: Secondary | ICD-10-CM | POA: Diagnosis not present

## 2016-09-19 DIAGNOSIS — D509 Iron deficiency anemia, unspecified: Secondary | ICD-10-CM | POA: Diagnosis not present

## 2016-10-17 ENCOUNTER — Telehealth: Payer: Self-pay | Admitting: Adult Health

## 2016-10-17 NOTE — Telephone Encounter (Signed)
Called Amanda [name] to scheduled awv appt. Left msg for pt to call office to schedule awv appt.

## 2016-11-02 DIAGNOSIS — Z13820 Encounter for screening for osteoporosis: Secondary | ICD-10-CM | POA: Diagnosis not present

## 2016-11-02 DIAGNOSIS — M810 Age-related osteoporosis without current pathological fracture: Secondary | ICD-10-CM | POA: Diagnosis not present

## 2016-11-21 ENCOUNTER — Other Ambulatory Visit: Payer: Self-pay | Admitting: Family Medicine

## 2016-11-21 ENCOUNTER — Ambulatory Visit
Admission: RE | Admit: 2016-11-21 | Discharge: 2016-11-21 | Disposition: A | Discharge status: Home / Self Care | Payer: Medicare Other | Source: Ambulatory Visit | Attending: Family Medicine | Admitting: Family Medicine

## 2016-11-21 DIAGNOSIS — M25512 Pain in left shoulder: Principal | ICD-10-CM

## 2016-11-21 DIAGNOSIS — M25511 Pain in right shoulder: Secondary | ICD-10-CM | POA: Diagnosis not present

## 2017-03-29 DIAGNOSIS — Z961 Presence of intraocular lens: Secondary | ICD-10-CM | POA: Diagnosis not present

## 2017-03-29 DIAGNOSIS — H401234 Low-tension glaucoma, bilateral, indeterminate stage: Secondary | ICD-10-CM | POA: Diagnosis not present

## 2017-10-16 ENCOUNTER — Other Ambulatory Visit (HOSPITAL_COMMUNITY): Payer: Self-pay | Admitting: Family Medicine

## 2017-10-16 ENCOUNTER — Ambulatory Visit (HOSPITAL_COMMUNITY)
Admission: RE | Admit: 2017-10-16 | Discharge: 2017-10-16 | Disposition: A | Discharge status: Home / Self Care | Payer: Medicare Other | Source: Ambulatory Visit | Attending: Vascular Surgery | Admitting: Vascular Surgery

## 2017-10-16 DIAGNOSIS — I739 Peripheral vascular disease, unspecified: Secondary | ICD-10-CM | POA: Diagnosis present

## 2017-10-16 DIAGNOSIS — R9439 Abnormal result of other cardiovascular function study: Secondary | ICD-10-CM | POA: Diagnosis not present

## 2018-02-20 ENCOUNTER — Other Ambulatory Visit: Payer: Self-pay | Admitting: Family Medicine

## 2018-02-20 ENCOUNTER — Ambulatory Visit
Admission: RE | Admit: 2018-02-20 | Discharge: 2018-02-20 | Disposition: A | Discharge status: Home / Self Care | Payer: Medicare Other | Source: Ambulatory Visit | Attending: Family Medicine | Admitting: Family Medicine

## 2018-02-20 DIAGNOSIS — S4991XA Unspecified injury of right shoulder and upper arm, initial encounter: Secondary | ICD-10-CM

## 2018-05-22 DIAGNOSIS — M7551 Bursitis of right shoulder: Secondary | ICD-10-CM | POA: Diagnosis not present

## 2018-05-22 DIAGNOSIS — I10 Essential (primary) hypertension: Secondary | ICD-10-CM | POA: Diagnosis not present

## 2018-05-22 DIAGNOSIS — D509 Iron deficiency anemia, unspecified: Secondary | ICD-10-CM | POA: Diagnosis not present

## 2018-07-03 DIAGNOSIS — I10 Essential (primary) hypertension: Secondary | ICD-10-CM | POA: Diagnosis not present

## 2018-07-03 DIAGNOSIS — M25511 Pain in right shoulder: Secondary | ICD-10-CM | POA: Diagnosis not present

## 2018-07-03 DIAGNOSIS — L819 Disorder of pigmentation, unspecified: Secondary | ICD-10-CM | POA: Diagnosis not present

## 2018-07-03 DIAGNOSIS — E440 Moderate protein-calorie malnutrition: Secondary | ICD-10-CM | POA: Diagnosis not present

## 2018-08-19 DIAGNOSIS — H01134 Eczematous dermatitis of left upper eyelid: Secondary | ICD-10-CM | POA: Diagnosis not present

## 2018-08-19 DIAGNOSIS — H01131 Eczematous dermatitis of right upper eyelid: Secondary | ICD-10-CM | POA: Diagnosis not present

## 2018-11-26 DIAGNOSIS — Z23 Encounter for immunization: Secondary | ICD-10-CM | POA: Diagnosis not present

## 2018-11-26 DIAGNOSIS — I10 Essential (primary) hypertension: Secondary | ICD-10-CM | POA: Diagnosis not present

## 2018-11-26 DIAGNOSIS — D649 Anemia, unspecified: Secondary | ICD-10-CM | POA: Diagnosis not present

## 2018-11-26 DIAGNOSIS — M25511 Pain in right shoulder: Secondary | ICD-10-CM | POA: Diagnosis not present

## 2018-11-26 DIAGNOSIS — E161 Other hypoglycemia: Secondary | ICD-10-CM | POA: Diagnosis not present

## 2018-11-26 DIAGNOSIS — Z0000 Encounter for general adult medical examination without abnormal findings: Secondary | ICD-10-CM | POA: Diagnosis not present

## 2018-12-10 DIAGNOSIS — M67911 Unspecified disorder of synovium and tendon, right shoulder: Secondary | ICD-10-CM | POA: Diagnosis not present

## 2018-12-10 DIAGNOSIS — M67912 Unspecified disorder of synovium and tendon, left shoulder: Secondary | ICD-10-CM | POA: Diagnosis not present

## 2018-12-27 DIAGNOSIS — R413 Other amnesia: Secondary | ICD-10-CM | POA: Diagnosis not present

## 2018-12-27 DIAGNOSIS — I10 Essential (primary) hypertension: Secondary | ICD-10-CM | POA: Diagnosis not present

## 2018-12-30 ENCOUNTER — Other Ambulatory Visit: Payer: Self-pay | Admitting: Family Medicine

## 2018-12-30 DIAGNOSIS — R413 Other amnesia: Secondary | ICD-10-CM

## 2019-01-10 ENCOUNTER — Ambulatory Visit
Admission: RE | Admit: 2019-01-10 | Discharge: 2019-01-10 | Disposition: A | Discharge status: Home / Self Care | Payer: Medicare Other | Source: Ambulatory Visit | Attending: Family Medicine | Admitting: Family Medicine

## 2019-01-10 DIAGNOSIS — H538 Other visual disturbances: Secondary | ICD-10-CM | POA: Diagnosis not present

## 2019-01-10 DIAGNOSIS — R413 Other amnesia: Secondary | ICD-10-CM

## 2019-02-10 DIAGNOSIS — I10 Essential (primary) hypertension: Secondary | ICD-10-CM | POA: Diagnosis not present

## 2019-02-17 ENCOUNTER — Ambulatory Visit: Payer: Medicare Other | Admitting: Neurology

## 2019-02-27 ENCOUNTER — Telehealth: Payer: Self-pay | Admitting: Neurology

## 2019-02-27 NOTE — Telephone Encounter (Signed)
Since this patient is a new patient and Dr. Rexene Alberts would like to see this type of patient in office, could you call her and see if she would like to be scheduled with Dr. Leta Baptist?

## 2019-03-03 NOTE — Telephone Encounter (Signed)
Called patient and informed her that due to current COVID 19 pandemic, our office is severely reducing in person visits in order to minimize the risk to our patients and healthcare providers. We recommend to convert your appointment to a video visit. She can have her appt sooner with Amanda [name].  She stated she has no capability to do video visit. She asked that it remain as scheduled in May with Amanda [name]. She wrote down appt date/time and verbalized understanding, appreciation.

## 2019-03-24 DIAGNOSIS — I10 Essential (primary) hypertension: Secondary | ICD-10-CM | POA: Diagnosis not present

## 2019-03-24 DIAGNOSIS — T148XXA Other injury of unspecified body region, initial encounter: Secondary | ICD-10-CM | POA: Diagnosis not present

## 2019-03-26 ENCOUNTER — Telehealth: Payer: Self-pay | Admitting: Neurology

## 2019-03-26 NOTE — Telephone Encounter (Signed)
Due to current COVID 19 pandemic, our office is severely reducing in office visits until further notice, in order to minimize the risk to our patients and healthcare providers.   Called patient to offer an in office visit, as we are beginning to allow a small amount of patients back in the office and patient is not able to do a virtual visit. Patient accepted in office visit and was rescheduled from June to 5/13. I explained that we are taking precaution against COVID-19. When she arrives she will need to park under the [address] near entrance and a staff member will check her temperature and screen her. Patient agreed.

## 2019-04-01 ENCOUNTER — Ambulatory Visit: Payer: Medicare Other | Admitting: Neurology

## 2019-04-02 ENCOUNTER — Ambulatory Visit: Payer: Medicare Other | Admitting: Neurology

## 2019-04-07 ENCOUNTER — Other Ambulatory Visit: Payer: Self-pay

## 2019-04-07 ENCOUNTER — Ambulatory Visit (INDEPENDENT_AMBULATORY_CARE_PROVIDER_SITE_OTHER): Payer: Medicare Other | Admitting: Neurology

## 2019-04-07 ENCOUNTER — Encounter: Payer: Self-pay | Admitting: Neurology

## 2019-04-07 VITALS — BP 157/89 | HR 67 | Temp 96.8°F | Ht 61.0 in | Wt 109.0 lb

## 2019-04-07 DIAGNOSIS — R413 Other amnesia: Secondary | ICD-10-CM | POA: Diagnosis not present

## 2019-04-07 MED ORDER — DONEPEZIL HCL 5 MG PO TABS: 5.0000 mg | ORAL_TABLET | Freq: Every day | ORAL | 5 refills | Status: DC

## 2019-04-07 NOTE — Patient Instructions (Signed)
You have complaints of memory loss: memory loss or changes in cognitive function can have many reasons and does not always mean you have dementia. Conditions that can contribute to subjective or objective memory loss include: depression, stress, poor sleep from insomnia or sleep apnea, dehydration, fluctuation in blood sugar values, thyroid or electrolyte dysfunction and certain vitamin deficiencies. Dementia can be caused by stroke, brain atherosclerosis or brain vascular disease due to vascular risk factors (smoking, high blood pressure, high cholesterol, obesity and uncontrolled diabetes), certain degenerative brain disorders (including Parkinson's disease and Multiple sclerosis) and by Alzheimer's disease or other, more rare and sometimes hereditary causes. We will do some additional testing:   You have had recent blood work (through Dr. Mannie Stabile).  You have had a recent brain scan (also through Dr. Mannie Stabile).  I will also request a formal cognitive test called neuropsychological evaluation which is done by a licensed neuropsychologist. We will make a referral in that regard. Your memory scores are abnormal, possibly in the moderate range.  We will start Aricept (generic name: donepezil) 5 mg: take one pill each evening. Common side effects may include dry eyes, dry mouth, confusion, low pulse, low blood pressure, also GI related side effects (nausea, vomiting, diarrhea, constipation), headaches; rare side effects may include hallucinations and seizures.   I am worried about your driving, please have your family monitor it and I would suggest at this point only local roads, familiar routes, no nighttime and no highway driving. You may not be safe to drive at this point, please talk to Dr. Mannie Stabile about this too.

## 2019-04-07 NOTE — Progress Notes (Signed)
Subjective:    Patient ID: Amanda [name] is a 85 y.o. female.  HPI     Star Age, MD, PhD [facility] [address] [place], [place] [address]  Dear Dr. Mannie Stabile,   I saw your patient, Amanda [name], upon your kind request in my neurologic clinic today for initial consultation of her memory impairment.  The patient is unaccompanied today and drove herself to the appointment.  Of note, she missed an appointment on 04/02/2019, reported that she forgot the appointment.  As you know, Amanda [name] is a very pleasant 85 year old right-handed woman with an underlying medical history of hypertension, migraines, osteoporosis, and depression, who reports memory problems for the past few years perhaps, probably over 2 years, per her report "a pretty good while".  I reviewed the office note from 12/27/2018.  She was placed on antidepressant medication at the time as she was feeling depressed and had stress.  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  She reports that she has had issues with her memory perhaps for the past several years.  She has high school education.  She has not had any confusion, she drives a car.  She is mostly forgetful.  She reports that she has gotten lost driving, she reports that when she goes to the store she often forgets why she went there.  She has had forgetfulness for names.  She lives alone, her son has been staying with her off and on.  She did not recall that she told you that in February he was living with her.  She states that he had lived with her for a week recently.  He is getting his own place in [place].  She reports that she lives in a not good neighborhood and that there are drugs in the neighborhood.  She has 3 children altogether, all 3 of them were in the [employer].  2 of them in [place] but son is moving to [place].  She has 2 daughters, one in [place] and the other is currently in the [employer], in [place],  [place].  She had difficulty remembering where her daughter was.  She also had difficulty relating her family history.  She denies any family history of memory loss and for stated that all her siblings were deceased and that they were 77 altogether with her but then reports that she had 1 sister and 2 brothers, she then went on to say that her youngest brother is still living and that she is the second to last amongst her siblings and age.  She also stated that she was the baby. She quit smoking many years ago, does not recall how long ago, she does not drink any alcohol, she reports that she drinks caffeine about 2 cups of coffee in the morning and 1 to 2 cans of soda per day.  She has had a fluctuation in her appetite, denies any significant weight loss recently.  She has been on Celexa and reports that her depression is a little better.  She has limited her driving.  She states that she only drives to church.  As far as getting her groceries, she reports that she goes to [employer].  She sleeps well she states.  She typically falls asleep in the den on the sofa.  She does not have a set bedtime routine or schedule.  She does turn the TV off before falling asleep.  She has no pets, she had a  pitbull but reports sometime around last Christmas someone stole her dog.  She denies any pain, denies any falls.  She drinks water.  You ordered a brain MRI.  She had a brain MRI without contrast on 01/10/2019 and I reviewed the results: IMPRESSION: Atrophy and small vessel disease.  No acute intracranial findings. White matter changes were reported in the moderate range, she also had a left basal ganglia old lacunar stroke.  She had blood work through your office including B12, TSH, RPR, and I reviewed the results, TSH and B12 were in the normal range.   Her Past Medical History Is Significant For: Past Medical History:  Diagnosis Date  . Hyperlipidemia   . Hypertension   . Osteoporosis   . PVC (premature  ventricular contraction)     Her Past Surgical History Is Significant For: Past Surgical History:  Procedure Laterality Date  . PARTIAL HYSTERECTOMY      Her Family History Is Significant For: Family History  Problem Relation Age of Onset  . Healthy Mother   . Healthy Father   . Healthy Sister   . Healthy Brother   . Healthy Brother   . Healthy Brother     Her Social History Is Significant For: Social History   Socioeconomic History  . Marital status: Divorced    Spouse name: Not on file  . Number of children: Not on file  . Years of education: Not on file  . Highest education level: Not on file  Occupational History  . Not on file  Social Needs  . Financial resource strain: Not on file  . Food insecurity:    Worry: Not on file    Inability: Not on file  . Transportation needs:    Medical: Not on file    Non-medical: Not on file  Tobacco Use  . Smoking status: Former Research scientist (life sciences)  . Smokeless tobacco: Never Used  Substance and Sexual Activity  . Alcohol use: No  . Drug use: No  . Sexual activity: Never  Lifestyle  . Physical activity:    Days per week: Not on file    Minutes per session: Not on file  . Stress: Not on file  Relationships  . Social connections:    Talks on phone: Not on file    Gets together: Not on file    Attends religious service: Not on file    Active member of club or organization: Not on file    Attends meetings of clubs or organizations: Not on file    Relationship status: Not on file  Other Topics Concern  . Not on file  Social History Narrative   Patient is a 85 year old AA female   Continues to work as Electrical engineer   Has a dog   Divorced 10 years ago.    Lives alone   Three children 1 boy two girls. One daughter lives close. Son and other daughter live in [place].       Goes to church        Her Allergies Are:  No Known Allergies:   Her Current Medications Are:  Outpatient Encounter Medications as of 04/07/2019   Medication Sig  . amLODipine (NORVASC) 5 MG tablet Take 10 mg by mouth daily.  Marland Kitchen aspirin 81 MG tablet Take 81 mg by mouth daily. Reported on 12/28/2015  . brimonidine (ALPHAGAN) 0.2 % ophthalmic solution 3 (three) times daily.  . citalopram (CELEXA) 20 MG tablet Take 20 mg by mouth daily.  Marland Kitchen  mirtazapine (REMERON) 7.5 MG tablet Take 15 mg by mouth at bedtime.  . [DISCONTINUED] mirtazapine (REMERON) 15 MG tablet Take 1 tablet (15 mg total) by mouth at bedtime. Reported on 02/18/2016  . [DISCONTINUED] nitroGLYCERIN (NITROSTAT) 0.4 MG SL tablet PLACE 1 TABLET UNDER THE TONGUE EVERY 5 MINUTES AS NEEDED FOR CHEST PAIN  . [DISCONTINUED] lisinopril (PRINIVIL,ZESTRIL) 40 MG tablet Take 1 tablet (40 mg total) by mouth daily.  . [DISCONTINUED] meloxicam (MOBIC) 7.5 MG tablet Take 1 tablet (7.5 mg total) by mouth daily.  . [DISCONTINUED] mirabegron ER (MYRBETRIQ) 25 MG TB24 tablet Take 1 tablet (25 mg total) by mouth daily.   No facility-administered encounter medications on file as of 04/07/2019.   :  Review of Systems:  Out of a complete 14 point review of systems, all are reviewed and negative with the exception of these symptoms as listed below:  Review of Systems  Neurological:       Pt presents today to discuss her memory. She reports that she forgets why she goes to stores. She can't remember names. She lives by herself and does drive.    Objective:  Neurological Exam  Physical Exam Physical Examination:   Vitals:   04/07/19 1005  BP: (!) 157/89  Pulse: 67  Temp: (!) 96.8 F (36 C)   General Examination: The patient is a very pleasant 85 y.o. female in no acute distress. She appears well-developed and well-nourished and adequately groomed.   HEENT: Normocephalic, atraumatic, pupils are equal, round and reactive to light and accommodation. Extraocular tracking is good without limitation to gaze excursion or nystagmus noted. Normal smooth pursuit is noted. Hearing is grossly intact. Face  is symmetric with normal facial animation and normal facial sensation. Speech is clear with no dysarthria noted. There is no hypophonia. There is no lip, neck/head, jaw or voice tremor. Neck is supple with full range of passive and active motion. There are no carotid bruits on auscultation. Oropharynx exam reveals: Mild mouth dryness, full dentures, slightly Loosefitting, tongue protrudes centrally in palate elevates symmetrically.  Chest: Clear to auscultation without wheezing, rhonchi or crackles noted.  Heart: S1+S2+0, regular and normal without murmurs, rubs or gallops noted.   Abdomen: Soft, non-tender and non-distended with normal bowel sounds appreciated on auscultation.  Extremities: There is no pitting edema in the distal lower extremities bilaterally. Pedal pulses are intact.  Skin: Warm and dry without trophic changes noted.  Musculoskeletal: exam reveals no obvious joint deformities, tenderness or joint swelling or erythema.   Neurologically:  Mental status: The patient is awake, alert and oriented in all 4 spheres. Her immediate and remote memory, attention, language skills and fund of knowledge are impaired.   There is no evidence of aphasia, agnosia, apraxia or anomia. Speech is clear with normal prosody and enunciation. Thought process is linear. Mood is normal and affect is normal.  On 04/07/2019: MMSE: 18/30, CDT: 0/4, AFT: 8/min.  Cranial nerves II - XII are as described above under HEENT exam. In addition: shoulder shrug is normal with equal shoulder height noted. Motor exam: Normal bulk, strength and tone is noted. There is no drift, tremor or rebound. Romberg is negative. Reflexes are 2+ throughout. Babinski: Toes are flexor bilaterally. Fine motor skills and coordination: intact with normal finger taps, normal hand movements, normal rapid alternating patting, normal foot taps and normal foot agility.  Cerebellar testing: No dysmetria or intention tremor on finger to nose  testing. Heel to shin is unremarkable bilaterally. There is no truncal  or gait ataxia.  Sensory exam: intact to light touch, vibration, temperature sense in the upper and lower extremities.  Gait, station and balance: She stands easily. No veering to one side is noted. No leaning to one side is noted. Posture is age-appropriate and stance is narrow based. Gait shows normal stride length and normal pace. No problems turning are noted.   Assessment and Plan:   In summary, Sarrinah Gardin is a very pleasant 85 y.o.-year old female with an underlying medical history of hypertension, migraines, osteoporosis, and depression, who presents for evaluation of her memory loss of a few years' duration.  She has forgetfulness, memory scores are abnormal, probably in the moderate range.  She has impaired visuospatial skills as demonstrated by significant difficulty with the clock drawing.  Her history, particularly her family history are not classic for Alzheimer's dementia.  She has a brain scan supportive of more vascular type dementia.  We talked about the Diagnosis of memory loss, and the importance of healthy lifestyle and supportive treatment of depression.I would like for her to get started on Aricept generic low dose, 5 mg daily, I provided written instructions and we talked about potential side effects, she is strongly encouraged to call if she were to experience any adverse effects.  I am a little worried about her living alone.  I am also worried about her driving, I am not convinced she is completely safe to drive even locally.She does admit that she has gotten lost driving.  I would like to proceed with a referral to neuropsychology for formal neuropsychological evaluation/cognitive testing.  She has had a recent brain MRI.  She had recent blood work through your office as well, which I reviewed.  I would like for her to return for a follow-up with 1 of our nurse practitioners soon, at which time we should try  to increase her donepezil dose to 10 mg daily if tolerated. She is reminded to stay well-hydrated with water, well-nourished and well rested. I answered all her questions today and the patient was in agreement with the above outlined plan.  Thank you very much for allowing me to participate in the care of this nice patient. If I can be of any further assistance to you please do not hesitate to call me at [phone].  Sincerely,   Star Age, MD, PhD

## 2019-04-15 ENCOUNTER — Encounter: Payer: Self-pay | Admitting: Psychology

## 2019-05-08 ENCOUNTER — Ambulatory Visit: Payer: Medicare Other | Admitting: Neurology

## 2019-06-18 DIAGNOSIS — H01134 Eczematous dermatitis of left upper eyelid: Secondary | ICD-10-CM | POA: Diagnosis not present

## 2019-06-18 DIAGNOSIS — H04123 Dry eye syndrome of bilateral lacrimal glands: Secondary | ICD-10-CM | POA: Diagnosis not present

## 2019-06-18 DIAGNOSIS — H401234 Low-tension glaucoma, bilateral, indeterminate stage: Secondary | ICD-10-CM | POA: Diagnosis not present

## 2019-06-18 DIAGNOSIS — H01131 Eczematous dermatitis of right upper eyelid: Secondary | ICD-10-CM | POA: Diagnosis not present

## 2019-06-18 DIAGNOSIS — R54 Age-related physical debility: Secondary | ICD-10-CM | POA: Diagnosis not present

## 2019-06-18 DIAGNOSIS — I10 Essential (primary) hypertension: Secondary | ICD-10-CM | POA: Diagnosis not present

## 2019-06-18 DIAGNOSIS — Z961 Presence of intraocular lens: Secondary | ICD-10-CM | POA: Diagnosis not present

## 2019-08-01 DIAGNOSIS — E538 Deficiency of other specified B group vitamins: Secondary | ICD-10-CM | POA: Diagnosis not present

## 2019-08-01 DIAGNOSIS — Z23 Encounter for immunization: Secondary | ICD-10-CM | POA: Diagnosis not present

## 2019-08-01 DIAGNOSIS — I10 Essential (primary) hypertension: Secondary | ICD-10-CM | POA: Diagnosis not present

## 2019-08-05 ENCOUNTER — Other Ambulatory Visit: Payer: Self-pay

## 2019-08-05 ENCOUNTER — Ambulatory Visit (INDEPENDENT_AMBULATORY_CARE_PROVIDER_SITE_OTHER): Payer: Medicare Other | Admitting: Adult Health

## 2019-08-05 ENCOUNTER — Encounter: Payer: Self-pay | Admitting: Adult Health

## 2019-08-05 VITALS — BP 176/99 | HR 73 | Temp 96.6°F | Ht 61.0 in | Wt 113.2 lb

## 2019-08-05 DIAGNOSIS — R413 Other amnesia: Secondary | ICD-10-CM

## 2019-08-05 NOTE — Patient Instructions (Signed)
Your Plan:  Continue Aricept 5 mg daily  Bring Son or daughter to next visit If your symptoms worsen or you develop new symptoms please let us know.   Thank you for coming to see [place] at [facility]. I hope we have been able to provide you high quality care today.  You may receive a patient satisfaction survey over the next few weeks. We would appreciate your feedback and comments so that we may continue to improve ourselves and the health of our patients.

## 2019-08-05 NOTE — Progress Notes (Addendum)
PATIENT: Amanda [name] DOB: Aug 13, 1933  REASON FOR VISIT: follow up HISTORY FROM: patient  HISTORY OF PRESENT ILLNESS: Today 08/05/19:  Amanda [name] is an 85 year old female with a history of memory disturbance.  She returns today for follow-up.  At the last visit she was started on Aricept 5 mg daily.  She feels that her memory has remained stable.  She lives at home alone.  She states that she has a son that checks on her frequently.  She has 2 daughters but they do not live locally.  She is able to complete all ADLs independently.  She states that she prepares meals without difficulty.  She manages her own finances.  No change in her mood or behavior.  She continues to operate a motor vehicle without difficulty.  She comes to this visit today alone.  HISTORY Amanda [name] is a very pleasant 85 year old right-handed woman with an underlying medical history of hypertension, migraines, osteoporosis, and depression, who reports memory problems for the past few years perhaps, probably over 2 years, per her report a pretty good while.  I reviewed the office note from 12/27/2018.  She was placed on antidepressant medication at the time as she was feeling depressed and had stress.  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  She reports that she has had issues with her memory perhaps for the past several years.  She has high school education.  She has not had any confusion, she drives a car.  She is mostly forgetful.  She reports that she has gotten lost driving, she reports that when she goes to the store she often forgets why she went there.  She has had forgetfulness for names.  She lives alone, her son has been staying with her off and on.  She did not recall that she told you that in February he was living with her.  She states that he had lived with her for a week recently.  He is getting his own place in [place].  She reports that she lives in a not good neighborhood and that there are  drugs in the neighborhood.  She has 3 children altogether, all 3 of them were in the [employer].  2 of them in [place] but son is moving to [place].  She has 2 daughters, one in [place] and the other is currently in the [employer], in [place], [place].  She had difficulty remembering where her daughter was.  She also had difficulty relating her family history.  She denies any family history of memory loss and for stated that all her siblings were deceased and that they were 4 altogether with her but then reports that she had 1 sister and 2 brothers, she then went on to say that her youngest brother is still living and that she is the second to last amongst her siblings and age.  She also stated that she was the baby. She quit smoking many years ago, does not recall how long ago, she does not drink any alcohol, she reports that she drinks caffeine about 2 cups of coffee in the morning and 1 to 2 cans of soda per day.  She has had a fluctuation in her appetite, denies any significant weight loss recently.  She has been on Celexa and reports that her depression is a little better.  She has limited her driving.  She states that she only drives to church.  As far as getting her groceries, she reports that she  goes to [employer].  She sleeps well she states.  She typically falls asleep in the den on the sofa.  She does not have a set bedtime routine or schedule.  She does turn the TV off before falling asleep.  She has no pets, she had a pitbull but reports sometime around last Christmas someone stole her dog.  She denies any pain, denies any falls.  She drinks water.  You ordered a brain MRI.  She had a brain MRI without contrast on 01/10/2019 and I reviewed the results: IMPRESSION: Atrophy and small vessel disease. No acute intracranial findings. White matter changes were reported in the moderate range, she also had a left basal ganglia old lacunar stroke.  She had blood work through your office including  B12, TSH, RPR, and I reviewed the results, TSH and B12 were in the normal range.    REVIEW OF SYSTEMS: Out of a complete 14 system review of symptoms, the patient complains only of the following symptoms, and all other reviewed systems are negative.  Memory loss, dizziness, weakness  ALLERGIES: No Known Allergies  HOME MEDICATIONS: Outpatient Medications Prior to Visit  Medication Sig Dispense Refill   amLODipine (NORVASC) 5 MG tablet Take 10 mg by mouth daily.     aspirin 81 MG tablet Take 81 mg by mouth daily. Reported on 12/28/2015     brimonidine (ALPHAGAN) 0.2 % ophthalmic solution 3 (three) times daily.     citalopram (CELEXA) 20 MG tablet Take 20 mg by mouth daily.     donepezil (ARICEPT) 5 MG tablet Take 1 tablet (5 mg total) by mouth at bedtime. 30 tablet 5   mirtazapine (REMERON) 7.5 MG tablet Take 15 mg by mouth at bedtime.     No facility-administered medications prior to visit.     PAST MEDICAL HISTORY: Past Medical History:  Diagnosis Date   Hyperlipidemia    Hypertension    Osteoporosis    PVC (premature ventricular contraction)     PAST SURGICAL HISTORY: Past Surgical History:  Procedure Laterality Date   PARTIAL HYSTERECTOMY      FAMILY HISTORY: Family History  Problem Relation Age of Onset   Healthy Mother    Healthy Father    Healthy Sister    Healthy Brother    Healthy Brother    Healthy Brother     SOCIAL HISTORY: Social History   Socioeconomic History   Marital status: Divorced    Spouse name: Not on file   Number of children: Not on file   Years of education: Not on file   Highest education level: Not on file  Occupational History   Not on file  Social Needs   Financial resource strain: Not on file   Food insecurity    Worry: Not on file    Inability: Not on file   Transportation needs    Medical: Not on file    Non-medical: Not on file  Tobacco Use   Smoking status: Former Smoker   Smokeless  tobacco: Never Used  Substance and Sexual Activity   Alcohol use: No   Drug use: No   Sexual activity: Never  Lifestyle   Physical activity    Days per week: Not on file    Minutes per session: Not on file   Stress: Not on file  Relationships   Social connections    Talks on phone: Not on file    Gets together: Not on file    Attends religious service: Not  on file    Active member of club or organization: Not on file    Attends meetings of clubs or organizations: Not on file    Relationship status: Not on file   Intimate partner violence    Fear of current or ex partner: Not on file    Emotionally abused: Not on file    Physically abused: Not on file    Forced sexual activity: Not on file  Other Topics Concern   Not on file  Social History Narrative   Patient is a 85 year old AA female   Continues to work as Electrical engineer   Has a dog   Divorced 10 years ago.    Lives alone   Three children 1 boy two girls. One daughter lives close. Son and other daughter live in [place].       Goes to church          PHYSICAL EXAM  Vitals:   08/05/19 0925  BP: (!) 176/99  Pulse: 73  Temp: (!) 96.6 F (35.9 C)  Weight: 113 lb 3.2 oz (51.3 kg)  Height: 5\' 1"  (1.549 m)   Body mass index is 21.39 kg/m.   MMSE - Mini Mental State Exam 08/05/2019 04/07/2019  Orientation to time 5 2  Orientation to Place 4 3  Registration 3 3  Attention/ Calculation 5 2  Recall 0 2  Language- name 2 objects 2 2  Language- repeat 0 0  Language- follow 3 step command 3 1  Language- read & follow direction 1 1  Write a sentence 1 1  Copy design 0 1  Copy design-comments named 6 animals -  Total score 24 18     Generalized: Well developed, in no acute distress   Neurological examination  Mentation: Alert oriented to time, place, history taking. Follows all commands speech and language fluent Cranial nerve II-XII: Pupils were equal round reactive to light. Extraocular movements  were full, visual field were full on confrontational test. Head turning and shoulder shrug  were normal and symmetric. Motor: The motor testing reveals 5 over 5 strength of all 4 extremities. Good symmetric motor tone is noted throughout.  Sensory: Sensory testing is intact to soft touch on all 4 extremities. No evidence of extinction is noted.  Coordination: Cerebellar testing reveals good finger-nose-finger and heel-to-shin bilaterally.  Gait and station: Gait is normal.  Reflexes: Deep tendon reflexes are symmetric and normal bilaterally.   DIAGNOSTIC DATA (LABS, IMAGING, TESTING) - I reviewed patient records, labs, notes, testing and imaging myself where available.  Lab Results  Component Value Date   WBC 5.3 03/01/2016   HGB 10.7 (L) 03/01/2016   HCT 33.0 (L) 03/01/2016   MCV 83.7 03/01/2016   PLT 324.0 03/01/2016      Component Value Date/Time   NA 141 03/01/2016 1416   K 3.8 03/01/2016 1416   CL 105 03/01/2016 1416   CO2 27 03/01/2016 1416   GLUCOSE 100 (H) 03/01/2016 1416   BUN 15 03/01/2016 1416   CREATININE 0.67 03/01/2016 1416   CALCIUM 9.8 03/01/2016 1416   PROT 7.2 12/28/2015 1425   ALBUMIN 4.1 12/28/2015 1425   AST 18 12/28/2015 1425   ALT 13 12/28/2015 1425   ALKPHOS 48 12/28/2015 1425   BILITOT 0.3 12/28/2015 1425   GFRNONAA 65 (L) 01/06/2015 0637   GFRAA 76 (L) 01/06/2015 0637   Lab Results  Component Value Date   CHOL 167 01/06/2015   HDL 40 01/06/2015  LDLCALC 116 (H) 01/06/2015   TRIG 53 01/06/2015   CHOLHDL 4.2 01/06/2015   Lab Results  Component Value Date   HGBA1C 6.1 (H) 01/06/2015   No results found for: VITAMINB12 Lab Results  Component Value Date   TSH 0.88 03/01/2016      ASSESSMENT AND PLAN 85 y.o. year old female  has a past medical history of Hyperlipidemia, Hypertension, Osteoporosis, and PVC (premature ventricular contraction). here with:  1.  Memory disturbance  The patient's memory score has improved since last visit.   She will continue on Aricept 5 mg daily.  I have encouraged the patient to bring a family member or friend that knows her well to the next visit.  I am concerned that we may not be getting complete information in regards to her memory since she lives alone.  She is advised that if her symptoms worsen or she develops new symptoms she should let us know.  She will follow-up in 6 months or sooner if needed.   I spent 15 minutes with the patient. 50% of this time was spent reviewing memory score   Ward Givens, MSN, NP-C 08/05/2019, 9:05 AM [facility] [address], [place], [place] [address] [phone]  I reviewed the above note and documentation by the Nurse Practitioner and agree with the history, exam, assessment and plan as outlined above. I was immediately available for consultation. Star Age, MD, PhD [facility])

## 2019-08-15 ENCOUNTER — Encounter: Payer: Medicare Other | Attending: Psychology | Admitting: Psychology

## 2019-11-05 DIAGNOSIS — I10 Essential (primary) hypertension: Secondary | ICD-10-CM | POA: Diagnosis not present

## 2019-11-05 DIAGNOSIS — D649 Anemia, unspecified: Secondary | ICD-10-CM | POA: Diagnosis not present

## 2019-11-05 DIAGNOSIS — M19011 Primary osteoarthritis, right shoulder: Secondary | ICD-10-CM | POA: Diagnosis not present

## 2019-11-21 DIAGNOSIS — E440 Moderate protein-calorie malnutrition: Secondary | ICD-10-CM | POA: Insufficient documentation

## 2019-11-21 DIAGNOSIS — Z87891 Personal history of nicotine dependence: Secondary | ICD-10-CM | POA: Insufficient documentation

## 2019-11-21 DIAGNOSIS — I739 Peripheral vascular disease, unspecified: Secondary | ICD-10-CM | POA: Insufficient documentation

## 2019-11-21 DIAGNOSIS — G43909 Migraine, unspecified, not intractable, without status migrainosus: Secondary | ICD-10-CM | POA: Insufficient documentation

## 2019-11-21 DIAGNOSIS — M810 Age-related osteoporosis without current pathological fracture: Secondary | ICD-10-CM | POA: Insufficient documentation

## 2019-12-02 ENCOUNTER — Ambulatory Visit
Admission: RE | Admit: 2019-12-02 | Discharge: 2019-12-02 | Disposition: A | Discharge status: Home / Self Care | Payer: Medicare Other | Source: Ambulatory Visit | Attending: Family Medicine | Admitting: Family Medicine

## 2019-12-02 ENCOUNTER — Other Ambulatory Visit: Payer: Self-pay | Admitting: Family Medicine

## 2019-12-02 ENCOUNTER — Other Ambulatory Visit: Payer: Self-pay

## 2019-12-02 DIAGNOSIS — D649 Anemia, unspecified: Secondary | ICD-10-CM | POA: Diagnosis not present

## 2019-12-02 DIAGNOSIS — R0609 Other forms of dyspnea: Secondary | ICD-10-CM

## 2019-12-02 DIAGNOSIS — E538 Deficiency of other specified B group vitamins: Secondary | ICD-10-CM | POA: Diagnosis not present

## 2019-12-02 DIAGNOSIS — R0600 Dyspnea, unspecified: Secondary | ICD-10-CM | POA: Diagnosis not present

## 2019-12-02 DIAGNOSIS — R05 Cough: Secondary | ICD-10-CM | POA: Diagnosis not present

## 2019-12-02 DIAGNOSIS — R7309 Other abnormal glucose: Secondary | ICD-10-CM | POA: Diagnosis not present

## 2019-12-02 DIAGNOSIS — M19011 Primary osteoarthritis, right shoulder: Secondary | ICD-10-CM | POA: Diagnosis not present

## 2019-12-02 DIAGNOSIS — I10 Essential (primary) hypertension: Secondary | ICD-10-CM | POA: Diagnosis not present

## 2019-12-02 DIAGNOSIS — Z0000 Encounter for general adult medical examination without abnormal findings: Secondary | ICD-10-CM | POA: Diagnosis not present

## 2019-12-11 ENCOUNTER — Telehealth: Payer: Self-pay | Admitting: Cardiology

## 2019-12-11 DIAGNOSIS — R0609 Other forms of dyspnea: Secondary | ICD-10-CM | POA: Insufficient documentation

## 2019-12-11 DIAGNOSIS — Z7189 Other specified counseling: Secondary | ICD-10-CM | POA: Insufficient documentation

## 2019-12-11 NOTE — Telephone Encounter (Signed)
[place] for daughter to accompany patient to appointment Tried to call X 2, no answer

## 2019-12-11 NOTE — Progress Notes (Signed)
Cardiology Office Note   Date:  12/12/2019   ID:  Amanda [name], DOB 1933-02-23, MRN [id number]  PCP:  Caren Macadam, MD  Cardiologist:   No primary care provider on file. Referring:  Caren Macadam, MD  Chief Complaint  Patient presents with  . Shortness of Breath     History of Present Illness: Amanda [name] is a 86 y.o. female who is referred by Caren Macadam, MD for evaluation of shortness of breath.    She saw Dr. Marlou Porch in 2016 for chest pain EF in 2016 was 50 - 55%.  She had PVCs but did not need further treatment or testing.    Her daughter noticed that she has been having shortness of breath with activity.  This happened recently when they were trying to do some walking.  Her daughter is from [place] and comes to see her and they were going out and doing some activities and mom was particularly short of breath.  She is not short of breath with activities such as doing housework she does living alone.  She might get winded if she walks in the grocery store holding onto the cart but this has been gradual.  She recovers quickly.  She is not describing PND or orthopnea.  However, the other day she was more short of breath perhaps because she was doing more.  She did not have chest pressure, neck or arm discomfort.  She does not have palpitations, presyncope or syncope.      Past Medical History:  Diagnosis Date  . Depression   . Hyperlipidemia   . Hypertension   . Memory disorder   . Osteoporosis   . PVC (premature ventricular contraction)     Past Surgical History:  Procedure Laterality Date  . PARTIAL HYSTERECTOMY       Current Outpatient Medications  Medication Sig Dispense Refill  . amLODipine (NORVASC) 5 MG tablet Take 10 mg by mouth daily.    . citalopram (CELEXA) 20 MG tablet Take 20 mg by mouth daily.    Marland Kitchen donepezil (ARICEPT) 5 MG tablet Take 1 tablet (5 mg total) by mouth at bedtime. 30 tablet 5   No current facility-administered medications for this  visit.    Allergies:   Patient has no known allergies.    Social History:  The patient  reports that she has quit smoking. She has never used smokeless tobacco. She reports that she does not drink alcohol or use drugs.   Family History:  The patient's family history is not on file.    ROS:  Please see the history of present illness.   Otherwise, review of systems are positive for none.   All other systems are reviewed and negative.    PHYSICAL EXAM: VS:  BP 120/82 (BP Location: Left Arm, Patient Position: Sitting, Cuff Size: Normal)   Pulse (!) 59   Temp (!) 97 F (36.1 C)   Ht 5\' 1"  (1.549 m)   Wt 116 lb (52.6 kg)   BMI 21.92 kg/m  , BMI Body mass index is 21.92 kg/m. GENERAL:  Well appearing HEENT:  Pupils equal round and reactive, fundi not visualized, oral mucosa unremarkable NECK:  No jugular venous distention, waveform within normal limits, carotid upstroke brisk and symmetric, no bruits, no thyromegaly LYMPHATICS:  No cervical, inguinal adenopathy LUNGS:  Clear to auscultation bilaterally BACK:  No CVA tenderness CHEST:  Unremarkable HEART:  PMI not displaced or sustained,S1 and S2 within normal limits, no S3, no S4,  no clicks, no rubs, no murmurs ABD:  Flat, positive bowel sounds normal in frequency in pitch, no bruits, no rebound, no guarding, no midline pulsatile mass, no hepatomegaly, no splenomegaly EXT:  2 plus pulses upper, diminished dorsalis pedis and posterior tibialis bilaterally, no edema, no cyanosis no clubbing, left femoral bruit SKIN:  No rashes no nodules NEURO:  Cranial nerves II through XII grossly intact, motor grossly intact throughout PSYCH:  Cognitively intact, oriented to person place and time    EKG:  EKG is ordered today. The ekg ordered today demonstrates sinus rhythm, rate 59, axis within normal limits, intervals within normal limits, no acute ST-T wave   Recent Labs: No results found for requested labs within last 8760 hours.     Lipid Panel    Component Value Date/Time   CHOL 167 01/06/2015 0637   TRIG 53 01/06/2015 0637   HDL 40 01/06/2015 0637   CHOLHDL 4.2 01/06/2015 0637   VLDL 11 01/06/2015 0637   LDLCALC 116 (H) 01/06/2015 0637      Wt Readings from Last 3 Encounters:  12/12/19 116 lb (52.6 kg)  08/05/19 113 lb 3.2 oz (51.3 kg)  04/07/19 109 lb (49.4 kg)      Other studies Reviewed: Additional studies/ records that were reviewed today include: Office records. Review of the above records demonstrates:  Please see elsewhere in the note.     ASSESSMENT AND PLAN:  DOE:   This may be related to aging but I am going to exclude obstructive coronary disease.  She would be able to walk on a treadmill.  She will have a [employer].  I will also check a BNP level.  DYSLIPIDEMIA:    LDL is 119 but has not been checked in a while.  I will defer to her primary provider.  I do not have I do not think the most recent labs for lipid profile.  COVID EDUCATION: She is scheduled to get the vaccine.    Current medicines are reviewed at length with the patient today.  The patient does not have concerns regarding medicines.  The following changes have been made:  no change  Labs/ tests ordered today include:   Orders Placed This Encounter  Procedures  . Brain natriuretic peptide  . MYOCARDIAL PERFUSION IMAGING  . EKG 12-Lead     Disposition:   FU with me as needed     Signed, Minus Breeding, MD  12/12/2019 2:09 PM    [place]

## 2019-12-11 NOTE — Telephone Encounter (Signed)
New Message  Pt's daughter called and said she will be accompanying her mother on her appt due to her mother having problems remembering important things by herself.  Please call to discuss

## 2019-12-12 ENCOUNTER — Encounter (INDEPENDENT_AMBULATORY_CARE_PROVIDER_SITE_OTHER): Payer: Self-pay

## 2019-12-12 ENCOUNTER — Other Ambulatory Visit: Payer: Self-pay

## 2019-12-12 ENCOUNTER — Encounter: Payer: Self-pay | Admitting: Cardiology

## 2019-12-12 ENCOUNTER — Ambulatory Visit: Payer: Medicare Other | Admitting: Cardiology

## 2019-12-12 VITALS — BP 120/82 | HR 59 | Temp 97.0°F | Ht 61.0 in | Wt 116.0 lb

## 2019-12-12 DIAGNOSIS — R0600 Dyspnea, unspecified: Secondary | ICD-10-CM

## 2019-12-12 DIAGNOSIS — Z7189 Other specified counseling: Secondary | ICD-10-CM

## 2019-12-12 DIAGNOSIS — R0609 Other forms of dyspnea: Secondary | ICD-10-CM

## 2019-12-12 NOTE — Telephone Encounter (Signed)
I spoke with patient and told her it was OK for her daughter to accompany her to today's appointment

## 2019-12-12 NOTE — Patient Instructions (Signed)
Medication Instructions:  No Changes *If you need a refill on your cardiac medications before your next appointment, please call your pharmacy*  Lab Work: Your physician recommends that you return for lab work today (BNP) If you have labs (blood work) drawn today and your tests are completely normal, you will receive your results only by: Marland Kitchen MyChart Message (if you have MyChart) OR . A paper copy in the mail If you have any lab test that is abnormal or we need to change your treatment, we will call you to review the results.  Testing/Procedures: Your physician has requested that you have a lexiscan myoview. For further information please visit [email or web address]. Please follow instruction sheet, as given.  Follow-Up: At [facility], you and your health needs are our priority.  As part of our continuing mission to provide you with exceptional heart care, we have created designated Provider Care Teams.  These Care Teams include your primary Cardiologist (physician) and Advanced Practice Providers (APPs -  Physician Assistants and Nurse Practitioners) who all work together to provide you with the care you need, when you need it.   Other Instructions Follow up as needed

## 2019-12-13 LAB — BRAIN NATRIURETIC PEPTIDE: BNP: 47.4 pg/mL (ref 0.0–100.0)

## 2019-12-16 ENCOUNTER — Ambulatory Visit: Payer: Medicare Other

## 2019-12-25 ENCOUNTER — Telehealth (HOSPITAL_COMMUNITY): Payer: Self-pay

## 2019-12-25 NOTE — Telephone Encounter (Signed)
Encounter complete. 

## 2019-12-26 ENCOUNTER — Ambulatory Visit: Payer: Medicare Other | Attending: Internal Medicine

## 2019-12-30 ENCOUNTER — Other Ambulatory Visit: Payer: Self-pay

## 2019-12-30 ENCOUNTER — Ambulatory Visit (HOSPITAL_COMMUNITY)
Admission: RE | Admit: 2019-12-30 | Discharge: 2019-12-30 | Disposition: A | Discharge status: Home / Self Care | Payer: Medicare Other | Source: Ambulatory Visit | Attending: Internal Medicine | Admitting: Internal Medicine

## 2019-12-30 DIAGNOSIS — R0600 Dyspnea, unspecified: Secondary | ICD-10-CM

## 2019-12-30 LAB — MYOCARDIAL PERFUSION IMAGING
LV dias vol: 90 mL (ref 46–106)
LV sys vol: 36 mL
Peak HR: 98 {beats}/min
Rest HR: 60 {beats}/min
SDS: 0
SRS: 0
SSS: 0
TID: 0.97

## 2019-12-30 MED ORDER — TECHNETIUM TC 99M TETROFOSMIN IV KIT
32.4000 | PACK | Freq: Once | INTRAVENOUS | Status: AC | PRN
  Administered 2019-12-30: 32.4 via INTRAVENOUS
  Filled 2019-12-30: qty 33

## 2019-12-30 MED ORDER — TECHNETIUM TC 99M TETROFOSMIN IV KIT
10.2000 | PACK | Freq: Once | INTRAVENOUS | Status: AC | PRN
  Administered 2019-12-30: 10.2 via INTRAVENOUS
  Filled 2019-12-30: qty 11

## 2019-12-30 MED ORDER — REGADENOSON 0.4 MG/5ML IV SOLN
0.4000 mg | Freq: Once | INTRAVENOUS | Status: AC
  Administered 2019-12-30: 0.4 mg via INTRAVENOUS

## 2020-01-01 DIAGNOSIS — M19012 Primary osteoarthritis, left shoulder: Secondary | ICD-10-CM | POA: Diagnosis not present

## 2020-01-01 DIAGNOSIS — M19011 Primary osteoarthritis, right shoulder: Secondary | ICD-10-CM | POA: Diagnosis not present

## 2020-01-01 DIAGNOSIS — I10 Essential (primary) hypertension: Secondary | ICD-10-CM | POA: Diagnosis not present

## 2020-01-02 ENCOUNTER — Ambulatory Visit: Payer: Medicare Other

## 2020-02-02 ENCOUNTER — Encounter: Payer: Self-pay | Admitting: Adult Health

## 2020-02-02 ENCOUNTER — Ambulatory Visit: Payer: Medicare Other | Admitting: Adult Health

## 2020-02-06 DIAGNOSIS — I10 Essential (primary) hypertension: Secondary | ICD-10-CM | POA: Diagnosis not present

## 2020-02-06 DIAGNOSIS — M19011 Primary osteoarthritis, right shoulder: Secondary | ICD-10-CM | POA: Diagnosis not present

## 2020-02-06 DIAGNOSIS — M19012 Primary osteoarthritis, left shoulder: Secondary | ICD-10-CM | POA: Diagnosis not present

## 2020-02-17 ENCOUNTER — Ambulatory Visit: Payer: Medicare Other | Admitting: Adult Health

## 2020-02-19 ENCOUNTER — Telehealth: Payer: Self-pay | Admitting: *Deleted

## 2020-02-19 ENCOUNTER — Ambulatory Visit: Payer: Medicare Other | Admitting: Adult Health

## 2020-02-19 ENCOUNTER — Encounter: Payer: Self-pay | Admitting: Adult Health

## 2020-02-19 NOTE — Telephone Encounter (Signed)
Patient was no show for follow up with NP today.  

## 2020-02-23 ENCOUNTER — Encounter: Payer: Self-pay | Admitting: Adult Health

## 2020-02-23 ENCOUNTER — Other Ambulatory Visit: Payer: Self-pay

## 2020-02-23 ENCOUNTER — Ambulatory Visit: Payer: Medicare Other | Admitting: Adult Health

## 2020-02-23 VITALS — BP 177/87 | HR 71 | Temp 97.0°F | Ht 61.0 in | Wt 117.0 lb

## 2020-02-23 DIAGNOSIS — R413 Other amnesia: Secondary | ICD-10-CM

## 2020-02-23 MED ORDER — DONEPEZIL HCL 10 MG PO TABS: 10.0000 mg | ORAL_TABLET | Freq: Every day | ORAL | 5 refills | Status: DC

## 2020-02-23 NOTE — Progress Notes (Addendum)
PATIENT: Amanda [name] DOB: [birth year]  REASON FOR VISIT: follow up HISTORY FROM: patient  HISTORY OF PRESENT ILLNESS: Today 02/23/20:  Ms. Amanda [name] is an 87 year old female with a history of memory disturbance.  She returns today for follow-up.  She continues on Aricept 5 mg daily.  She is here alone.  Reports that she continues to live at home alone.  She does have a son that lives near her.  She also has 2 daughters that do not live locally but do check on her frequently via telephone.  She reports that she is able to complete all ADLs independently.  Reports that she operates a motor vehicle without difficulty.  Manages her own finances.  She manages her own medications.  The patient has missed 2 appointments at our office.  She reports that she thought they were scheduled on a different day-despite getting reminder calls.  She returns today for an evaluation.  HISTORY 08/05/19:  Ms. Amanda [name] is an 86 year old female with a history of memory disturbance.  She returns today for follow-up.  At the last visit she was started on Aricept 5 mg daily.  She feels that her memory has remained stable.  She lives at home alone.  She states that she has a son that checks on her frequently.  She has 2 daughters but they do not live locally.  She is able to complete all ADLs independently.  She states that she prepares meals without difficulty.  She manages her own finances.  No change in her mood or behavior.  She continues to operate a motor vehicle without difficulty.  She comes to this visit today alone.  REVIEW OF SYSTEMS: Out of a complete 14 system review of symptoms, the patient complains only of the following symptoms, and all other reviewed systems are negative.  See HPI  ALLERGIES: No Known Allergies  HOME MEDICATIONS: Outpatient Medications Prior to Visit  Medication Sig Dispense Refill  . amLODipine (NORVASC) 5 MG tablet Take 10 mg by mouth daily.    . citalopram (CELEXA) 20 MG tablet Take  20 mg by mouth daily.    Marland Kitchen donepezil (ARICEPT) 5 MG tablet Take 1 tablet (5 mg total) by mouth at bedtime. 30 tablet 5   No facility-administered medications prior to visit.    PAST MEDICAL HISTORY: Past Medical History:  Diagnosis Date  . Depression   . Hyperlipidemia   . Hypertension   . Memory disorder   . Osteoporosis   . PVC (premature ventricular contraction)     PAST SURGICAL HISTORY: Past Surgical History:  Procedure Laterality Date  . PARTIAL HYSTERECTOMY      FAMILY HISTORY: No family history on file.  SOCIAL HISTORY: Social History   Socioeconomic History  . Marital status: Divorced    Spouse name: Not on file  . Number of children: Not on file  . Years of education: Not on file  . Highest education level: Not on file  Occupational History  . Not on file  Tobacco Use  . Smoking status: Former Research scientist (life sciences)  . Smokeless tobacco: Never Used  Substance and Sexual Activity  . Alcohol use: No  . Drug use: No  . Sexual activity: Never  Other Topics Concern  . Not on file  Social History Narrative   Worked in [employer].   Divorced 10    Lives alone   Son lives in town and two daughter lives out of town.       Social Determinants of Health  Financial Resource Strain:   . Difficulty of Paying Living Expenses:   Food Insecurity:   . Worried About Charity fundraiser in the Last Year:   . Arboriculturist in the Last Year:   Transportation Needs:   . Film/video editor (Medical):   Marland Kitchen Lack of Transportation (Non-Medical):   Physical Activity:   . Days of Exercise per Week:   . Minutes of Exercise per Session:   Stress:   . Feeling of Stress :   Social Connections:   . Frequency of Communication with Friends and Family:   . Frequency of Social Gatherings with Friends and Family:   . Attends Religious Services:   . Active Member of Clubs or Organizations:   . Attends Archivist Meetings:   Marland Kitchen Marital Status:   Intimate Partner Violence:   .  Fear of Current or Ex-Partner:   . Emotionally Abused:   Marland Kitchen Physically Abused:   . Sexually Abused:       PHYSICAL EXAM  Vitals:   02/23/20 0845  BP: (!) 177/87  Pulse: 71  Temp: (!) 97 F (36.1 C)  Weight: 117 lb (53.1 kg)  Height: 5\' 1"  (1.549 m)   Body mass index is 22.11 kg/m.   MMSE - Mini Mental State Exam 02/23/2020 08/05/2019 04/07/2019  Orientation to time 4 5 2   Orientation to Place 3 4 3   Registration 3 3 3   Attention/ Calculation 4 5 2   Recall 1 0 2  Language- name 2 objects 2 2 2   Language- repeat 0 0 0  Language- follow 3 step command 3 3 1   Language- read & follow direction 1 1 1   Write a sentence 1 1 1   Copy design 0 0 1  Copy design-comments - named 6 animals -  Total score 22 24 18      Generalized: Well developed, in no acute distress   Neurological examination  Mentation: Alert oriented to time, place, history taking. Follows all commands speech and language fluent Cranial nerve II-XII: Pupils were equal round reactive to light. Extraocular movements were full, visual field were full on confrontational test. Facial sensation and strength were normal. Uvula tongue midline. Head turning and shoulder shrug  were normal and symmetric. Motor: The motor testing reveals 5 over 5 strength of all 4 extremities. Good symmetric motor tone is noted throughout.  Sensory: Sensory testing is intact to soft touch on all 4 extremities. No evidence of extinction is noted.  Coordination: Cerebellar testing reveals good finger-nose-finger and heel-to-shin bilaterally.  Gait and station: Gait is normal. Tandem gait is normal. Romberg is negative. No drift is seen.  Reflexes: Deep tendon reflexes are symmetric and normal bilaterally.   DIAGNOSTIC DATA (LABS, IMAGING, TESTING) - I reviewed patient records, labs, notes, testing and imaging myself where available.  Lab Results  Component Value Date   WBC 5.3 03/01/2016   HGB 10.7 (L) 03/01/2016   HCT 33.0 (L) 03/01/2016     MCV 83.7 03/01/2016   PLT 324.0 03/01/2016      Component Value Date/Time   NA 141 03/01/2016 1416   K 3.8 03/01/2016 1416   CL 105 03/01/2016 1416   CO2 27 03/01/2016 1416   GLUCOSE 100 (H) 03/01/2016 1416   BUN 15 03/01/2016 1416   CREATININE 0.67 03/01/2016 1416   CALCIUM 9.8 03/01/2016 1416   PROT 7.2 12/28/2015 1425   ALBUMIN 4.1 12/28/2015 1425   AST 18 12/28/2015 1425   ALT 13  12/28/2015 1425   ALKPHOS 48 12/28/2015 1425   BILITOT 0.3 12/28/2015 1425   GFRNONAA 65 (L) 01/06/2015 0637   GFRAA 76 (L) 01/06/2015 0637   Lab Results  Component Value Date   CHOL 167 01/06/2015   HDL 40 01/06/2015   LDLCALC 116 (H) 01/06/2015   TRIG 53 01/06/2015   CHOLHDL 4.2 01/06/2015   Lab Results  Component Value Date   HGBA1C 6.1 (H) 01/06/2015   No results found for: VITAMINB12 Lab Results  Component Value Date   TSH 0.88 03/01/2016      ASSESSMENT AND PLAN 86 y.o. year old female  has a past medical history of Depression, Hyperlipidemia, Hypertension, Memory disorder, Osteoporosis, and PVC (premature ventricular contraction). here with:  1.  Memory disturbance  -MMSE slightly declined today 22/30 previously 24/30 -Increase Aricept to 10 mg at bedtime -Advised patient that I will call her daughter to go over today's visit.  I do have concerns about the patient's memory.  She reports that she is able to do everything for herself however she has missed several appointments at our office. -She will return in 6 months or sooner if needed   I spent 25 minutes of face-to-face and non-face-to-face time with patient.  This included previsit chart review, lab review, study review, order entry, electronic health record documentation, patient education.  Ward Givens, MSN, NP-C 02/23/2020, 8:57 AM [facility] [address], [place], [place] [address] [phone]  I reviewed the above note and documentation by the Nurse Practitioner and agree with  the history, exam, assessment and plan as outlined above. I was available for consultation. Star Age, MD, PhD [facility])

## 2020-02-23 NOTE — Patient Instructions (Signed)
Your Plan:  Increase Aricept 10 mg at bedtime If your symptoms worsen or you develop new symptoms please let us know.   Thank you for coming to see us at [facility]. I hope we have been able to provide you high quality care today.  You may receive a patient satisfaction survey over the next few weeks. We would appreciate your feedback and comments so that we may continue to improve ourselves and the health of our patients.  

## 2020-03-16 DIAGNOSIS — S83241A Other tear of medial meniscus, current injury, right knee, initial encounter: Secondary | ICD-10-CM | POA: Diagnosis not present

## 2020-03-16 DIAGNOSIS — M1711 Unilateral primary osteoarthritis, right knee: Secondary | ICD-10-CM | POA: Diagnosis not present

## 2020-03-19 DIAGNOSIS — M19011 Primary osteoarthritis, right shoulder: Secondary | ICD-10-CM | POA: Diagnosis not present

## 2020-03-19 DIAGNOSIS — I10 Essential (primary) hypertension: Secondary | ICD-10-CM | POA: Diagnosis not present

## 2020-03-19 DIAGNOSIS — M19012 Primary osteoarthritis, left shoulder: Secondary | ICD-10-CM | POA: Diagnosis not present

## 2020-04-13 DIAGNOSIS — M19011 Primary osteoarthritis, right shoulder: Secondary | ICD-10-CM | POA: Diagnosis not present

## 2020-04-13 DIAGNOSIS — M19012 Primary osteoarthritis, left shoulder: Secondary | ICD-10-CM | POA: Diagnosis not present

## 2020-04-13 DIAGNOSIS — I10 Essential (primary) hypertension: Secondary | ICD-10-CM | POA: Diagnosis not present

## 2020-05-04 ENCOUNTER — Emergency Department (HOSPITAL_COMMUNITY): Payer: Medicare Other

## 2020-05-04 ENCOUNTER — Other Ambulatory Visit: Payer: Self-pay

## 2020-05-04 ENCOUNTER — Emergency Department (HOSPITAL_COMMUNITY)
Admission: EM | Admit: 2020-05-04 | Discharge: 2020-05-04 | Disposition: A | Discharge status: Home / Self Care | Payer: Medicare Other | Attending: Internal Medicine | Admitting: Internal Medicine

## 2020-05-04 ENCOUNTER — Ambulatory Visit (INDEPENDENT_AMBULATORY_CARE_PROVIDER_SITE_OTHER)
Admission: EM | Admit: 2020-05-04 | Discharge: 2020-05-04 | Disposition: A | Discharge status: Other Acute Inpatient Hospital | Payer: Medicare Other | Source: Home / Self Care

## 2020-05-04 ENCOUNTER — Encounter (HOSPITAL_COMMUNITY): Payer: Self-pay | Admitting: Emergency Medicine

## 2020-05-04 ENCOUNTER — Encounter (HOSPITAL_COMMUNITY): Payer: Self-pay

## 2020-05-04 DIAGNOSIS — R519 Headache, unspecified: Secondary | ICD-10-CM | POA: Diagnosis not present

## 2020-05-04 DIAGNOSIS — R42 Dizziness and giddiness: Secondary | ICD-10-CM

## 2020-05-04 DIAGNOSIS — M19012 Primary osteoarthritis, left shoulder: Secondary | ICD-10-CM | POA: Diagnosis not present

## 2020-05-04 DIAGNOSIS — H538 Other visual disturbances: Secondary | ICD-10-CM | POA: Insufficient documentation

## 2020-05-04 DIAGNOSIS — Z87891 Personal history of nicotine dependence: Secondary | ICD-10-CM | POA: Diagnosis not present

## 2020-05-04 DIAGNOSIS — S4992XA Unspecified injury of left shoulder and upper arm, initial encounter: Secondary | ICD-10-CM | POA: Diagnosis not present

## 2020-05-04 DIAGNOSIS — Z79899 Other long term (current) drug therapy: Secondary | ICD-10-CM | POA: Insufficient documentation

## 2020-05-04 DIAGNOSIS — W19XXXA Unspecified fall, initial encounter: Secondary | ICD-10-CM

## 2020-05-04 DIAGNOSIS — I119 Hypertensive heart disease without heart failure: Secondary | ICD-10-CM | POA: Insufficient documentation

## 2020-05-04 DIAGNOSIS — M19019 Primary osteoarthritis, unspecified shoulder: Secondary | ICD-10-CM

## 2020-05-04 LAB — CBC
HCT: 38.9 % (ref 36.0–46.0)
Hemoglobin: 12.3 g/dL (ref 12.0–15.0)
MCH: 28.7 pg (ref 26.0–34.0)
MCHC: 31.6 g/dL (ref 30.0–36.0)
MCV: 90.9 fL (ref 80.0–100.0)
Platelets: 317 10*3/uL (ref 150–400)
RBC: 4.28 MIL/uL (ref 3.87–5.11)
RDW: 15.9 % — ABNORMAL HIGH (ref 11.5–15.5)
WBC: 7.3 10*3/uL (ref 4.0–10.5)
nRBC: 0 % (ref 0.0–0.2)

## 2020-05-04 LAB — BASIC METABOLIC PANEL
Anion gap: 7 (ref 5–15)
BUN: 10 mg/dL (ref 8–23)
CO2: 26 mmol/L (ref 22–32)
Calcium: 8.7 mg/dL — ABNORMAL LOW (ref 8.9–10.3)
Chloride: 105 mmol/L (ref 98–111)
Creatinine, Ser: 0.7 mg/dL (ref 0.44–1.00)
GFR calc Af Amer: >60 mL/min (ref >60)
GFR calc non Af Amer: >60 mL/min (ref >60)
Glucose, Bld: 92 mg/dL (ref 70–99)
Potassium: 3.4 mmol/L — ABNORMAL LOW (ref 3.5–5.1)
Sodium: 138 mmol/L (ref 135–145)

## 2020-05-04 LAB — URINALYSIS, ROUTINE W REFLEX MICROSCOPIC
Bacteria, UA: NONE SEEN
Bilirubin Urine: NEGATIVE
Glucose, UA: NEGATIVE mg/dL
Hgb urine dipstick: NEGATIVE
Ketones, ur: NEGATIVE mg/dL
Nitrite: NEGATIVE
Protein, ur: NEGATIVE mg/dL
Specific Gravity, Urine: 1.015 (ref 1.005–1.030)
pH: 6 (ref 5.0–8.0)

## 2020-05-04 MED ORDER — SODIUM CHLORIDE 0.9% FLUSH: 3.0000 mL | Freq: Once | INTRAVENOUS | Status: DC

## 2020-05-04 MED ORDER — NAPROXEN 500 MG PO TABS
500.0000 mg | ORAL_TABLET | Freq: Two times a day (BID) | ORAL | 0 refills | Status: AC
Start: 2020-05-04 — End: 2020-05-09

## 2020-05-04 MED ORDER — MECLIZINE HCL 25 MG PO TABS
25.0000 mg | ORAL_TABLET | Freq: Three times a day (TID) | ORAL | 0 refills | Status: DC | PRN
Start: 2020-05-04 — End: 2021-02-25

## 2020-05-04 MED ORDER — NAPROXEN 250 MG PO TABS
500.0000 mg | ORAL_TABLET | Freq: Once | ORAL | Status: AC
  Administered 2020-05-04: 500 mg via ORAL
  Filled 2020-05-04: qty 2

## 2020-05-04 MED ORDER — MECLIZINE HCL 25 MG PO TABS
50.0000 mg | ORAL_TABLET | Freq: Once | ORAL | Status: AC
  Administered 2020-05-04: 50 mg via ORAL
  Filled 2020-05-04: qty 2

## 2020-05-04 NOTE — Discharge Instructions (Signed)
You need to have further evaluation in the Emergency department for your symptoms today

## 2020-05-04 NOTE — ED Notes (Signed)
Joanne Chars (Daughter#[phone]) called for status on her mother.

## 2020-05-04 NOTE — Discharge Instructions (Addendum)
You were seen today for headache, dizziness, a fall and shoulder pain. Your workup included head CT, brain MRI, shoulder xray, labs. These were all reassuring. 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 We are treating you with antiinflammatories and a medication for dizziness called meclizine.   Thank you for allowing me to care for you today. Please return to the emergency department if you have new or worsening symptoms. Take your medications as instructed.

## 2020-05-04 NOTE — ED Provider Notes (Signed)
[place]    CSN: [id number] Arrival date & time: 05/04/20  1038      History   Chief Complaint Chief Complaint  Patient presents with  . Headache  . Blurred Vision  . Dizziness  . Fall    HPI Amanda [name] is a 86 y.o. female.    Patient is brought to urgent care by granddaughter for bad headache, dizziness and blurred vision with onset this morning around 7 AM.  Patient reports that time of 7 AM however granddaughter believe this may have occurred around 9 AM.  Reported patient woke up began having a bad frontal headache and had some dizziness and also had a fall.   it is not believe that she hit her head.  She fell onto her knees.  This occurred shortly before or during a visit by home health aide.  Since then she has had continued headache, dizziness and on as well as blurry vision.  Patient reports dizziness gets worse with standing.  She does not have a history of bad headaches.  Patient has had chronic issues with the right shoulder and this has been hurting her and causing some weakness in the right arm.  Otherwise there is been no weakness.  Reported the patient is normally mobile and active however does have some memory loss issues.  Patient denies any chest pain or shortness of breath.  She continues to endorse a bad frontal headache, blurry vision and dizziness in this clinic.  She has never had something similar like this with the occasional dizziness with standing however this is much worse than previous.     Past Medical History:  Diagnosis Date  . Depression   . Hyperlipidemia   . Hypertension   . Memory disorder   . Osteoporosis   . PVC (premature ventricular contraction)     Patient Active Problem List   Diagnosis Date Noted  . DOE (dyspnea on exertion) 12/11/2019  . Educated about COVID-19 virus infection 12/11/2019  . Loss of weight 03/01/2016  . Hyperlipidemia 10/25/2015  . PVC (premature ventricular contraction) 01/21/2015  .  Hypertensive cardiovascular disease 01/21/2015  . Chest pain 01/05/2015  . HTN (hypertension) 01/05/2015    Past Surgical History:  Procedure Laterality Date  . PARTIAL HYSTERECTOMY      OB History   No obstetric history on file.      Home Medications    Prior to Admission medications   Medication Sig Start Date End Date Taking? Authorizing Provider  amLODipine (NORVASC) 10 MG tablet Take 10 mg by mouth daily.   Yes [provider]  vitamin B-12 (CYANOCOBALAMIN) 1000 MCG tablet Take by mouth daily.   Yes [provider]  amLODipine (NORVASC) 5 MG tablet Take 10 mg by mouth daily.    [provider]  citalopram (CELEXA) 20 MG tablet Take 20 mg by mouth daily.    [provider]  donepezil (ARICEPT) 10 MG tablet Take 1 tablet (10 mg total) by mouth at bedtime. 02/23/20   Ward Givens, NP  losartan (COZAAR) 50 MG tablet  01/09/20   [provider]    Family History History reviewed. No pertinent family history.  Social History Social History   Tobacco Use  . Smoking status: Former Research scientist (life sciences)  . Smokeless tobacco: Never Used  Substance Use Topics  . Alcohol use: No  . Drug use: No     Allergies   Patient has no known allergies.   Review of Systems Review of  Systems   Physical Exam Triage Vital Signs ED Triage Vitals  Enc Vitals Group     BP 05/04/20 1125 (!) 158/82     Pulse Rate 05/04/20 1125 60     Resp 05/04/20 1125 (!) 24     Temp 05/04/20 1125 97.9 F (36.6 C)     Temp Source 05/04/20 1125 Oral     SpO2 05/04/20 1125 94 %     Weight --      Height --      Head Circumference --      Peak Flow --      Pain Score 05/04/20 1131 7     Pain Loc --      Pain Edu? --      Excl. in [place]? --    No data found.  Updated Vital Signs BP (!) 158/82 (BP Location: Left Arm)   Pulse 60   Temp 97.9 F (36.6 C) (Oral)   Resp (!) 24   SpO2 94%   Visual Acuity Right Eye Distance:   Left Eye Distance:   Bilateral  Distance:    Right Eye Near:   Left Eye Near:    Bilateral Near:     Physical Exam Vitals and nursing note reviewed.  Constitutional:      General: She is not in acute distress.    Appearance: She is well-developed. She is not ill-appearing, toxic-appearing or diaphoretic.  HENT:     Head: Normocephalic and atraumatic.  Eyes:     Extraocular Movements: Extraocular movements intact.     Conjunctiva/sclera: Conjunctivae normal.     Pupils: Pupils are equal, round, and reactive to light.  Cardiovascular:     Rate and Rhythm: Normal rate and regular rhythm.     Heart sounds: No murmur heard.   Pulmonary:     Effort: Pulmonary effort is normal. No respiratory distress.     Breath sounds: Normal breath sounds.  Abdominal:     Palpations: Abdomen is soft.     Tenderness: There is no abdominal tenderness.  Musculoskeletal:     Cervical back: Neck supple.  Skin:    General: Skin is warm and dry.  Neurological:     Mental Status: She is alert and oriented to person, place, and time.     Cranial Nerves: No cranial nerve deficit, dysarthria or facial asymmetry.     Motor: No weakness.     Coordination: Coordination normal.     Gait: Gait normal.  Psychiatric:        Mood and Affect: Mood normal.        Speech: Speech normal.        Behavior: Behavior normal.      UC Treatments / Results  Labs (all labs ordered are listed, but only abnormal results are displayed) Labs Reviewed - No data to display  EKG   Radiology No results found.  Procedures Procedures (including critical care time)  Medications Ordered in UC Medications - No data to display  Initial Impression / Assessment and Plan / UC Course  I have reviewed the triage vital signs and the nursing notes.  Pertinent labs & imaging results that were available during my care of the patient were reviewed by me and considered in my medical decision making (see chart for details).     #Bad  headache #Dizziness #Blurry vision #Fall Patient is a 86 year old without history of headaches presenting with bad headache and associated neurologic symptoms.  She does not have any  focal deficits with a reassuring neurologic exam today, however given reported symptoms and onset of a severe headache feel she needs further evaluation with likely CT in order to rule out intracranial hemorrhage or any sign of stroke, given this we will refer to [facility] emergency department for further evaluation.  She has stable vital signs and believe she can be self transported by family member.  Final Clinical Impressions(s) / UC Diagnoses   Final diagnoses:  Dizziness  Bad headache  Blurry vision  Fall, initial encounter     Discharge Instructions     You need to have further evaluation in the Emergency department for your symptoms today    ED Prescriptions    None     PDMP not reviewed this encounter.   Purnell Shoemaker, PA-C 05/04/20 1332

## 2020-05-04 NOTE — ED Triage Notes (Addendum)
Pt states she started having a bad headache and the room was spinning; pt tried to stand up and she fell from over her right shoulder. At this moment pt is complaining of right shoulder pain, blurred vision, room spinning. Pt denies chest pain, abdominal pain, diarrhea.   Pt had breakfast and took her medication after this episode happened, around 9 am today. BP was 162/100 this morning.

## 2020-05-04 NOTE — ED Provider Notes (Signed)
[facility] EMERGENCY DEPARTMENT Provider Note   CSN: [id number] Arrival date & time: 05/04/20  1217     History Chief Complaint  Patient presents with  . Dizziness  . Blurred Vision    Amanda [name] is a 86 y.o. female.  Patient is an 86 year old female who has a past medical history of PVCs, hypertension, who lives at home alone presenting to the emergency department for dizziness, fall and headache.  The majority of the history is provided by the patient's granddaughter over the phone.  She reports that the patient called her this morning and stated that she had fallen because she had a bad headache and was dizzy.  The patient endorses that this is true.  Denies any syncope, chest pain, shortness of breath.  The patient reports that she occasionally gets dizzy throughout the day but that today was worse.  Also reports occasional headaches but today's headache was also worse.  She has had baseline blurry vision.  Currently reports that she is feeling somewhat dizzy and tired of waiting in the emergency department all day.  Also reports that she currently has a headache.  She does report when she fell she landed on her knee and also her left shoulder which she has been having trouble with for several weeks as well.        Past Medical History:  Diagnosis Date  . Depression   . Hyperlipidemia   . Hypertension   . Memory disorder   . Osteoporosis   . PVC (premature ventricular contraction)     Patient Active Problem List   Diagnosis Date Noted  . DOE (dyspnea on exertion) 12/11/2019  . Educated about COVID-19 virus infection 12/11/2019  . Loss of weight 03/01/2016  . Hyperlipidemia 10/25/2015  . PVC (premature ventricular contraction) 01/21/2015  . Hypertensive cardiovascular disease 01/21/2015  . Chest pain 01/05/2015  . HTN (hypertension) 01/05/2015    Past Surgical History:  Procedure Laterality Date  . PARTIAL HYSTERECTOMY       OB History     No obstetric history on file.     History reviewed. No pertinent family history.  Social History   Tobacco Use  . Smoking status: Former Research scientist (life sciences)  . Smokeless tobacco: Never Used  Substance Use Topics  . Alcohol use: No  . Drug use: No    Home Medications Prior to Admission medications   Medication Sig Start Date End Date Taking? Authorizing Provider  amLODipine (NORVASC) 10 MG tablet Take 10 mg by mouth daily.   Yes [provider]  citalopram (CELEXA) 20 MG tablet Take 20 mg by mouth daily.   Yes [provider]  donepezil (ARICEPT) 5 MG tablet Take 5 mg by mouth at bedtime.   Yes [provider]  losartan (COZAAR) 50 MG tablet Take 50 mg by mouth daily.  01/09/20  Yes [provider]  vitamin B-12 (CYANOCOBALAMIN) 1000 MCG tablet Take 1,000 mcg by mouth daily.    Yes [provider]  donepezil (ARICEPT) 10 MG tablet Take 1 tablet (10 mg total) by mouth at bedtime. Patient not taking: Reported on 05/04/2020 02/23/20   Ward Givens, NP  meclizine (ANTIVERT) 25 MG tablet Take 1 tablet (25 mg total) by mouth 3 (three) times daily as needed for dizziness. 05/04/20   Alveria Apley, PA-C  mirtazapine (REMERON SOL-TAB) 15 MG disintegrating tablet Take 15 mg by mouth at bedtime. Patient not taking: Reported on 05/04/2020 04/13/20   [provider]  naproxen (NAPROSYN) 500 MG tablet Take 1 tablet (500 mg total) by mouth 2 (two) times daily for 5 days. 05/04/20 05/09/20  Alveria Apley, PA-C    Allergies    Patient has no known allergies.  Review of Systems   Review of Systems  Constitutional: Negative for appetite change, chills and fever.  HENT: Negative for congestion and rhinorrhea.   Eyes: Positive for visual disturbance.  Respiratory: Negative for cough and shortness of breath.   Cardiovascular: Negative for chest pain.  Gastrointestinal: Negative for abdominal pain, nausea and vomiting.  Genitourinary: Negative for dysuria.   Musculoskeletal: Positive for arthralgias.  Skin: Negative for rash and wound.  Neurological: Positive for dizziness, light-headedness and headaches. Negative for tremors, seizures, syncope, speech difficulty and numbness.  All other systems reviewed and are negative.   Physical Exam Updated Vital Signs BP (!) 150/67   Pulse 64   Temp 98.4 F (36.9 C) (Oral)   Resp (!) 22   Ht 5\' 1"  (1.549 m)   Wt 50.8 kg   SpO2 93%   BMI 21.16 kg/m   Physical Exam Vitals and nursing note reviewed.  Constitutional:      General: She is not in acute distress.    Appearance: Normal appearance. She is not ill-appearing, toxic-appearing or diaphoretic.  HENT:     Head: Normocephalic.     Mouth/Throat:     Mouth: Mucous membranes are moist.  Eyes:     Extraocular Movements: Extraocular movements intact.     Conjunctiva/sclera: Conjunctivae normal.     Pupils: Pupils are equal, round, and reactive to light.  Cardiovascular:     Rate and Rhythm: Normal rate and regular rhythm.  Pulmonary:     Effort: Pulmonary effort is normal.     Breath sounds: Normal breath sounds.  Musculoskeletal:     Right lower leg: No edema.     Left lower leg: No edema.     Comments: decreased rOM of bilateral shoulders. No obvious signs of injury or trauma. TTP of the left posterior shoulder. Good grip strength bilaterally.  Skin:    General: Skin is dry.  Neurological:     General: No focal deficit present.     Mental Status: She is alert and oriented to person, place, and time.     Sensory: No sensory deficit.     Coordination: Coordination normal.     Comments: Globally weak. Horizontal nystagmus.  Psychiatric:        Mood and Affect: Mood normal.     ED Results / Procedures / Treatments   Labs (all labs ordered are listed, but only abnormal results are displayed) Labs Reviewed  BASIC METABOLIC PANEL - Abnormal; Notable for the following components:      Result Value   Potassium 3.4 (*)    Calcium  8.7 (*)    All other components within normal limits  CBC - Abnormal; Notable for the following components:   RDW 15.9 (*)    All other components within normal limits  URINALYSIS, ROUTINE W REFLEX MICROSCOPIC - Abnormal; Notable for the following components:   Leukocytes,Ua TRACE (*)    All other components within normal limits  CBG MONITORING, ED    EKG None  Radiology CT HEAD WO CONTRAST  Result Date: 05/04/2020 CLINICAL DATA:  Acute headache.  Dizziness blurred vision. EXAM: CT HEAD WITHOUT CONTRAST TECHNIQUE: Contiguous axial images were obtained from the base of the skull through the vertex without intravenous contrast. COMPARISON:  CT  head 10/01/2009.  MRI head 01/10/2019 FINDINGS: Brain: Mild atrophy. Negative for hydrocephalus. Chronic ischemic changes in the white matter and left internal capsule. Negative for acute infarct, hemorrhage, mass. Vascular: Negative for hyperdense vessel Skull: Negative Sinuses/Orbits: Mild mucosal edema paranasal sinuses. Bilateral cataract extraction.  No orbital lesion. Other: None IMPRESSION: No acute abnormality. Atrophy and chronic ischemic changes stable from prior MRI. Electronically Signed   By: Franchot Gallo M.D.   On: 05/04/2020 14:28   MR BRAIN WO CONTRAST  Result Date: 05/04/2020 CLINICAL DATA:  Nonspecific dizziness.  Headache.  Blurred vision. EXAM: MRI HEAD WITHOUT CONTRAST TECHNIQUE: Multiplanar, multiecho pulse sequences of the brain and surrounding structures were obtained without intravenous contrast. COMPARISON:  Head CT same day.  MRI 01/10/2019 FINDINGS: Brain: Diffusion imaging does not show any acute or subacute infarction. There are old small vessel ischemic changes at the pontomedullary junction. No focal cerebellar insult. Cerebral hemispheres show old lacunar infarction in the left basal ganglia and chronic small-vessel ischemic changes of the hemispheric deep white matter. No large vessel territory infarction. No mass lesion,  hemorrhage, hydrocephalus or extra-axial collection. No significant change when compared to the study of February 2020. Vascular: Major vessels at the base of the brain show flow. Skull and upper cervical spine: Negative Sinuses/Orbits: Clear/normal Other: None IMPRESSION: No significant change since February 2020. No acute or subacute finding. Chronic small-vessel ischemic changes of the pontomedullary junction, left basal ganglia and cerebral hemispheric white matter. Electronically Signed   By: Nelson Chimes M.D.   On: 05/04/2020 21:39   DG Shoulder Left  Result Date: 05/04/2020 CLINICAL DATA:  [place] EXAM: LEFT SHOULDER - 2+ VIEW COMPARISON:  02/20/2018 FINDINGS: Frontal and transscapular views of the left shoulder are obtained. There is severe glenohumeral osteoarthritis unchanged. Mild hypertrophic changes of the acromioclavicular joint again noted. No acute displaced fracture, subluxation, or dislocation. Left chest is clear. IMPRESSION: 1. Osteoarthritis of the acromioclavicular and glenohumeral joints. No acute fracture. Electronically Signed   By: Randa Ngo M.D.   On: 05/04/2020 21:14    Procedures Procedures (including critical care time)  Medications Ordered in ED Medications  sodium chloride flush (NS) 0.9 % injection 3 mL (has no administration in time range)  naproxen (NAPROSYN) tablet 500 mg (has no administration in time range)  meclizine (ANTIVERT) tablet 50 mg (50 mg Oral Given 05/04/20 2021)    ED Course  I have reviewed the triage vital signs and the nursing notes.  Pertinent labs & imaging results that were available during my care of the patient were reviewed by me and considered in my medical decision making (see chart for details).  Clinical Course as of May 04 2237  Tue May 04, 2020  2142 Patient presenting with dizziness and headache which was worse this morning.  Has had problems with intermittent symptoms in the past.  She had persistent symptoms here while in  the ED.  Otherwise appeared well with no focal deficits on exam.  CT of her head was performed within 6 hours of onset of symptoms and was negative for signs of acute intracranial pathology.  MRI was performed for concern of acute stroke.  This also showed no acute pathology. Patient would like to go home. Discussed this with the patient, her daughter and her grandaughter who are all in agreement with plan. She has been referred by urgent care to neurology and will f/u with them. Rx for meclizine for dizziness and naproxen for her arthritic shoulder given/   [  KM]    Clinical Course User Index [KM] Kristine Royal   MDM Rules/Calculators/A&P                          Based on review of vitals, medical screening exam, lab work and/or imaging, there does not appear to be an acute, emergent etiology for the patient's symptoms. Counseled pt on good return precautions and encouraged both PCP and ED follow-up as needed.  Prior to discharge, I also discussed incidental imaging findings with patient in detail and advised appropriate, recommended follow-up in detail.  Clinical Impression: 1. Dizziness   2. Fall   3. Arthritis, shoulder region     Disposition: Discharge  Prior to providing a prescription for a controlled substance, I independently reviewed the patient's recent prescription history on the [place]. The patient had no recent or regular prescriptions and was deemed appropriate for a brief, less than 3 day prescription of narcotic for acute analgesia.  This note was prepared with assistance of Systems analyst. Occasional wrong-word or sound-a-like substitutions may have occurred due to the inherent limitations of voice recognition software.  Final Clinical Impression(s) / ED Diagnoses Final diagnoses:  Dizziness  Arthritis, shoulder region    Rx / DC Orders ED Discharge Orders         Ordered    meclizine  (ANTIVERT) 25 MG tablet  3 times daily PRN     Discontinue  Reprint     05/04/20 2236    naproxen (NAPROSYN) 500 MG tablet  2 times daily     Discontinue  Reprint     05/04/20 2236           Alveria Apley, PA-C 05/04/20 2238    Veryl Speak, MD 05/04/20 [phone]

## 2020-05-04 NOTE — ED Triage Notes (Signed)
Patient arrives to ED with complaints of dizziness for many months, blurred vision starting x3 weeks ago, headache x1 weeks, and a fall that occurred this morning. Patient denies CP, SOB, Abd pain. NIH 0.

## 2020-05-04 NOTE — ED Notes (Signed)
Patient is being discharged from the Urgent Care and sent to the Emergency Department via POV with family driving . Per Roland Rack, PA, patient is in need of higher level of care due to fall, headache and dizziness. Patient is aware and verbalizes understanding of plan of care.  Vitals:   05/04/20 1125  BP: (!) 158/82  Pulse: 60  Resp: (!) 24  Temp: 97.9 F (36.6 C)  SpO2: 94%

## 2020-05-11 DIAGNOSIS — D509 Iron deficiency anemia, unspecified: Secondary | ICD-10-CM | POA: Diagnosis not present

## 2020-05-11 DIAGNOSIS — D649 Anemia, unspecified: Secondary | ICD-10-CM | POA: Diagnosis not present

## 2020-05-11 DIAGNOSIS — M19012 Primary osteoarthritis, left shoulder: Secondary | ICD-10-CM | POA: Diagnosis not present

## 2020-05-11 DIAGNOSIS — M19011 Primary osteoarthritis, right shoulder: Secondary | ICD-10-CM | POA: Diagnosis not present

## 2020-05-11 DIAGNOSIS — I10 Essential (primary) hypertension: Secondary | ICD-10-CM | POA: Diagnosis not present

## 2020-05-19 DIAGNOSIS — R42 Dizziness and giddiness: Secondary | ICD-10-CM | POA: Diagnosis not present

## 2020-05-19 DIAGNOSIS — W19XXXD Unspecified fall, subsequent encounter: Secondary | ICD-10-CM | POA: Diagnosis not present

## 2020-05-19 DIAGNOSIS — E876 Hypokalemia: Secondary | ICD-10-CM | POA: Diagnosis not present

## 2020-05-19 DIAGNOSIS — I10 Essential (primary) hypertension: Secondary | ICD-10-CM | POA: Diagnosis not present

## 2020-05-19 DIAGNOSIS — R269 Unspecified abnormalities of gait and mobility: Secondary | ICD-10-CM | POA: Diagnosis not present

## 2020-06-11 DIAGNOSIS — I10 Essential (primary) hypertension: Secondary | ICD-10-CM | POA: Diagnosis not present

## 2020-06-11 DIAGNOSIS — M19011 Primary osteoarthritis, right shoulder: Secondary | ICD-10-CM | POA: Diagnosis not present

## 2020-06-11 DIAGNOSIS — D509 Iron deficiency anemia, unspecified: Secondary | ICD-10-CM | POA: Diagnosis not present

## 2020-06-11 DIAGNOSIS — M19012 Primary osteoarthritis, left shoulder: Secondary | ICD-10-CM | POA: Diagnosis not present

## 2020-06-11 DIAGNOSIS — D649 Anemia, unspecified: Secondary | ICD-10-CM | POA: Diagnosis not present

## 2020-07-14 DIAGNOSIS — M19011 Primary osteoarthritis, right shoulder: Secondary | ICD-10-CM | POA: Diagnosis not present

## 2020-07-14 DIAGNOSIS — I10 Essential (primary) hypertension: Secondary | ICD-10-CM | POA: Diagnosis not present

## 2020-07-14 DIAGNOSIS — M19012 Primary osteoarthritis, left shoulder: Secondary | ICD-10-CM | POA: Diagnosis not present

## 2020-07-14 DIAGNOSIS — D509 Iron deficiency anemia, unspecified: Secondary | ICD-10-CM | POA: Diagnosis not present

## 2020-07-14 DIAGNOSIS — D649 Anemia, unspecified: Secondary | ICD-10-CM | POA: Diagnosis not present

## 2020-08-16 DIAGNOSIS — H401234 Low-tension glaucoma, bilateral, indeterminate stage: Secondary | ICD-10-CM | POA: Diagnosis not present

## 2020-08-16 DIAGNOSIS — H01131 Eczematous dermatitis of right upper eyelid: Secondary | ICD-10-CM | POA: Diagnosis not present

## 2020-08-16 DIAGNOSIS — H01134 Eczematous dermatitis of left upper eyelid: Secondary | ICD-10-CM | POA: Diagnosis not present

## 2020-08-16 DIAGNOSIS — S0512XA Contusion of eyeball and orbital tissues, left eye, initial encounter: Secondary | ICD-10-CM | POA: Diagnosis not present

## 2020-08-16 DIAGNOSIS — H04123 Dry eye syndrome of bilateral lacrimal glands: Secondary | ICD-10-CM | POA: Diagnosis not present

## 2020-08-17 DIAGNOSIS — I10 Essential (primary) hypertension: Secondary | ICD-10-CM | POA: Diagnosis not present

## 2020-08-17 DIAGNOSIS — M19011 Primary osteoarthritis, right shoulder: Secondary | ICD-10-CM | POA: Diagnosis not present

## 2020-08-17 DIAGNOSIS — M19012 Primary osteoarthritis, left shoulder: Secondary | ICD-10-CM | POA: Diagnosis not present

## 2020-08-17 DIAGNOSIS — D509 Iron deficiency anemia, unspecified: Secondary | ICD-10-CM | POA: Diagnosis not present

## 2020-08-17 DIAGNOSIS — D649 Anemia, unspecified: Secondary | ICD-10-CM | POA: Diagnosis not present

## 2020-08-24 NOTE — Progress Notes (Addendum)
PATIENT: Amanda [name] DOB: [birth year]  REASON FOR VISIT: follow up HISTORY FROM: patient  HISTORY OF PRESENT ILLNESS: Today 08/24/20:  Amanda [name] is an 86 year old female with a history of memory disturbance.  She returns today for follow-up.  Overall she feels that her memory has remained stable.  She lives at home alone.  She is able to complete all ADLs independently.  She manages her own medications and appointment.  Granddaughter is here with her today.  She states that they do help oversee these things.  Patient denies any trouble sleeping.  She continues to cook without difficulty.  Reports that she does operate a motor vehicle but drives very little.  Granddaughter reports that they came in today to the patient having dizzy episodes and headache.  She went to the emergency room back in June and was told by the physician there that she has a follow-up with neurology this month and to discuss issues then.  The patient had a fall on September 26.  Reports that she tripped over the rug and hit her head on the corner of the door on the left side.  She states that since then she is has some pain above the left eye and swelling.  The ED gave her meclizine for dizziness.  She states that today that the dizziness is not bothering her.  Denies headache.  Granddaughter reports that her complaints of dizziness has declined since her ED visit in June.  She has not followed up with her PCP or cardiologist since her ED visit.  HISTORY 02/23/20:  Amanda [name] is an 86 year old female with a history of memory disturbance.  She returns today for follow-up.  She continues on Aricept 5 mg daily.  She is here alone.  Reports that she continues to live at home alone.  She does have a son that lives near her.  She also has 2 daughters that do not live locally but do check on her frequently via telephone.  She reports that she is able to complete all ADLs independently.  Reports that she operates a motor vehicle  without difficulty.  Manages her own finances.  She manages her own medications.  The patient has missed 2 appointments at our office.  She reports that she thought they were scheduled on a different day-despite getting reminder calls.  She returns today for an evaluation.  REVIEW OF SYSTEMS: Out of a complete 14 system review of symptoms, the patient complains only of the following symptoms, and all other reviewed systems are negative.  ALLERGIES: No Known Allergies  HOME MEDICATIONS: Outpatient Medications Prior to Visit  Medication Sig Dispense Refill  . amLODipine (NORVASC) 10 MG tablet Take 10 mg by mouth daily.    . citalopram (CELEXA) 20 MG tablet Take 20 mg by mouth daily.    Marland Kitchen donepezil (ARICEPT) 10 MG tablet Take 1 tablet (10 mg total) by mouth at bedtime. (Patient not taking: Reported on 05/04/2020) 30 tablet 5  . donepezil (ARICEPT) 5 MG tablet Take 5 mg by mouth at bedtime.    Marland Kitchen losartan (COZAAR) 50 MG tablet Take 50 mg by mouth daily.     . meclizine (ANTIVERT) 25 MG tablet Take 1 tablet (25 mg total) by mouth 3 (three) times daily as needed for dizziness. 30 tablet 0  . mirtazapine (REMERON SOL-TAB) 15 MG disintegrating tablet Take 15 mg by mouth at bedtime. (Patient not taking: Reported on 05/04/2020)    . vitamin B-12 (CYANOCOBALAMIN) 1000 MCG tablet  Take 1,000 mcg by mouth daily.      No facility-administered medications prior to visit.    PAST MEDICAL HISTORY: Past Medical History:  Diagnosis Date  . Depression   . Hyperlipidemia   . Hypertension   . Memory disorder   . Osteoporosis   . PVC (premature ventricular contraction)     PAST SURGICAL HISTORY: Past Surgical History:  Procedure Laterality Date  . PARTIAL HYSTERECTOMY      FAMILY HISTORY: No family history on file.  SOCIAL HISTORY: Social History   Socioeconomic History  . Marital status: Divorced    Spouse name: Not on file  . Number of children: Not on file  . Years of education: Not on file    . Highest education level: Not on file  Occupational History  . Not on file  Tobacco Use  . Smoking status: Former Research scientist (life sciences)  . Smokeless tobacco: Never Used  Substance and Sexual Activity  . Alcohol use: No  . Drug use: No  . Sexual activity: Never  Other Topics Concern  . Not on file  Social History Narrative   Worked in [place].   Divorced 10    Lives alone   Son lives in town and two daughter lives out of town.       Social Determinants of Health   Financial Resource Strain:   . Difficulty of Paying Living Expenses: Not on file  Food Insecurity:   . Worried About Charity fundraiser in the Last Year: Not on file  . Ran Out of Food in the Last Year: Not on file  Transportation Needs:   . Lack of Transportation (Medical): Not on file  . Lack of Transportation (Non-Medical): Not on file  Physical Activity:   . Days of Exercise per Week: Not on file  . Minutes of Exercise per Session: Not on file  Stress:   . Feeling of Stress : Not on file  Social Connections:   . Frequency of Communication with Friends and Family: Not on file  . Frequency of Social Gatherings with Friends and Family: Not on file  . Attends Religious Services: Not on file  . Active Member of Clubs or Organizations: Not on file  . Attends Archivist Meetings: Not on file  . Marital Status: Not on file  Intimate Partner Violence:   . Fear of Current or Ex-Partner: Not on file  . Emotionally Abused: Not on file  . Physically Abused: Not on file  . Sexually Abused: Not on file      PHYSICAL EXAM  Vitals:   08/25/20 0809  BP: (!) 158/88  Pulse: 65  Weight: 117 lb 3.2 oz (53.2 kg)  Height: 5\' 1"  (1.549 m)   Body mass index is 22.14 kg/m.   MMSE - Mini Mental State Exam 08/25/2020 02/23/2020 08/05/2019  Orientation to time 3 4 5   Orientation to Place 4 3 4   Registration 3 3 3   Attention/ Calculation 1 4 5   Recall 0 1 0  Language- name 2 objects 2 2 2   Language- repeat 0 0 0   Language- follow 3 step command 3 3 3   Language- read & follow direction 1 1 1   Write a sentence 1 1 1   Copy design 0 0 0  Copy design-comments - - named 6 animals  Total score 18 22 24      Generalized: Well developed.  Swelling above the left eye below the eyebrow.  Pain with palpation.  Neurological examination  Mentation: Alert oriented to time, place, history taking. Follows all commands speech and language fluent Cranial nerve II-XII: Pupils were equal round reactive to light. Extraocular movements were full, visual field were full on confrontational test. Facial sensation and strength were normal. Uvula tongue midline. Head turning and shoulder shrug  were normal and symmetric.  Motor: The motor testing reveals 5 over 5 strength of all 4 extremities. Good symmetric motor tone is noted throughout.  Range of motion limited in the right shoulder due to dislocation Sensory: Sensory testing is intact to soft touch on all 4 extremities. No evidence of extinction is noted.  Coordination: Cerebellar testing reveals good finger-nose-finger and heel-to-shin bilaterally.  Gait and station: Gait is normal.  Reflexes: Deep tendon reflexes are symmetric and normal bilaterally.   DIAGNOSTIC DATA (LABS, IMAGING, TESTING) - I reviewed patient records, labs, notes, testing and imaging myself where available.  Lab Results  Component Value Date   WBC 7.3 05/04/2020   HGB 12.3 05/04/2020   HCT 38.9 05/04/2020   MCV 90.9 05/04/2020   PLT 317 05/04/2020      Component Value Date/Time   NA 138 05/04/2020 1237   K 3.4 (L) 05/04/2020 1237   CL 105 05/04/2020 1237   CO2 26 05/04/2020 1237   GLUCOSE 92 05/04/2020 1237   BUN 10 05/04/2020 1237   CREATININE 0.70 05/04/2020 1237   CALCIUM 8.7 (L) 05/04/2020 1237   PROT 7.2 12/28/2015 1425   ALBUMIN 4.1 12/28/2015 1425   AST 18 12/28/2015 1425   ALT 13 12/28/2015 1425   ALKPHOS 48 12/28/2015 1425   BILITOT 0.3 12/28/2015 1425   GFRNONAA >60  05/04/2020 1237   GFRAA >60 05/04/2020 1237   Lab Results  Component Value Date   CHOL 167 01/06/2015   HDL 40 01/06/2015   LDLCALC 116 (H) 01/06/2015   TRIG 53 01/06/2015   CHOLHDL 4.2 01/06/2015   Lab Results  Component Value Date   HGBA1C 6.1 (H) 01/06/2015   No results found for: VITAMINB12 Lab Results  Component Value Date   TSH 0.88 03/01/2016      ASSESSMENT AND PLAN 86 y.o. year old female  has a past medical history of Depression, Hyperlipidemia, Hypertension, Memory disorder, Osteoporosis, and PVC (premature ventricular contraction). here with:  Memory disturbance   Memory score has declined MMSE today 18/30 previously 22 out of 30  Continue Aricept  May consider adding on Namenda-patient plans to follow-up with PCP and cardiology first  Recent fall   Patient has swelling above the left eye with discomfort to palpation.  I will order CT scan of the head to evaluate after recent fall  Advised if patient continues to have frequent falls they should let us know  Dizziness/headache   Patient is currently not complaining about dizziness and headache.  However I explained to the granddaughter and the patient that the dizziness may not be from a neurologic origin.  They should discuss episode in June with cardiology and PCP.  Certainly we can send to vestibular rehab if dizziness is due to positional vertigo.    I spent 35 minutes of face-to-face and non-face-to-face time with patient.  This included previsit chart review, lab review, study review, order entry, electronic health record documentation, patient education.  Ward Givens, MSN, NP-C 08/24/2020, 4:17 PM [facility] [address] [place], [place] [address] [phone]  I reviewed the above note and documentation by the Nurse Practitioner and agree with the history, exam, assessment  and plan as outlined above. I was available for consultation. Star Age, MD,  PhD [facility])

## 2020-08-25 ENCOUNTER — Ambulatory Visit: Payer: Medicare Other | Admitting: Adult Health

## 2020-08-25 ENCOUNTER — Ambulatory Visit
Admission: RE | Admit: 2020-08-25 | Discharge: 2020-08-25 | Disposition: A | Discharge status: Home / Self Care | Payer: Medicare Other | Source: Ambulatory Visit | Attending: Adult Health | Admitting: Adult Health

## 2020-08-25 ENCOUNTER — Telehealth: Payer: Self-pay | Admitting: Adult Health

## 2020-08-25 ENCOUNTER — Encounter: Payer: Self-pay | Admitting: Adult Health

## 2020-08-25 ENCOUNTER — Other Ambulatory Visit: Payer: Self-pay

## 2020-08-25 VITALS — BP 158/88 | HR 65 | Ht 61.0 in | Wt 117.2 lb

## 2020-08-25 DIAGNOSIS — S0993XA Unspecified injury of face, initial encounter: Secondary | ICD-10-CM

## 2020-08-25 DIAGNOSIS — W19XXXA Unspecified fall, initial encounter: Secondary | ICD-10-CM

## 2020-08-25 DIAGNOSIS — R42 Dizziness and giddiness: Secondary | ICD-10-CM

## 2020-08-25 DIAGNOSIS — S0990XA Unspecified injury of head, initial encounter: Secondary | ICD-10-CM | POA: Diagnosis not present

## 2020-08-25 DIAGNOSIS — R413 Other amnesia: Secondary | ICD-10-CM | POA: Diagnosis not present

## 2020-08-25 NOTE — Telephone Encounter (Signed)
[employer] medicare no auth. pt is going to walk in to GI

## 2020-08-25 NOTE — Patient Instructions (Signed)
Your Plan:  Continue Aricept  CT head  May consider adding Namenda If your symptoms worsen or you develop new symptoms please let us know.    Thank you for coming to see [place] at [facility]. I hope we have been able to provide you high quality care today.  You may receive a patient satisfaction survey over the next few weeks. We would appreciate your feedback and comments so that we may continue to improve ourselves and the health of our patients.

## 2020-09-10 DIAGNOSIS — I10 Essential (primary) hypertension: Secondary | ICD-10-CM | POA: Diagnosis not present

## 2020-09-10 DIAGNOSIS — R7303 Prediabetes: Secondary | ICD-10-CM | POA: Diagnosis not present

## 2020-09-10 DIAGNOSIS — R42 Dizziness and giddiness: Secondary | ICD-10-CM | POA: Diagnosis not present

## 2020-09-10 DIAGNOSIS — M19011 Primary osteoarthritis, right shoulder: Secondary | ICD-10-CM | POA: Diagnosis not present

## 2020-09-10 DIAGNOSIS — R296 Repeated falls: Secondary | ICD-10-CM | POA: Diagnosis not present

## 2020-09-10 DIAGNOSIS — Z23 Encounter for immunization: Secondary | ICD-10-CM | POA: Diagnosis not present

## 2020-09-13 ENCOUNTER — Ambulatory Visit: Payer: Medicare Other | Attending: Internal Medicine

## 2020-09-13 ENCOUNTER — Telehealth: Payer: Self-pay | Admitting: Adult Health

## 2020-09-13 DIAGNOSIS — Z23 Encounter for immunization: Secondary | ICD-10-CM

## 2020-09-13 DIAGNOSIS — R413 Other amnesia: Secondary | ICD-10-CM

## 2020-09-13 NOTE — Telephone Encounter (Signed)
Amanda [name] from [facility] office called stating that the pt has seen the provider and should go ahead and start on the [place]. Amanda [name] states that the pt's Granddaughter [name] would need to be called to keep in the loop since she is the one keeping track of the pt's health. [phone]

## 2020-09-13 NOTE — Progress Notes (Signed)
   Covid-19 Vaccination Clinic  Name:  Amanda [name]    MRN: [id number] DOB: [birth year]  09/13/2020  Amanda [name] was observed post Covid-19 immunization for 15 minutes without incident. She was provided with Vaccine Information Sheet and instruction to access the V-Safe system.   Amanda [name] was instructed to call 911 with any severe reactions post vaccine: Marland Kitchen Difficulty breathing  . Swelling of face and throat  . A fast heartbeat  . A bad rash all over body  . Dizziness and weakness

## 2020-09-13 NOTE — Progress Notes (Signed)
   Covid-19 Vaccination Clinic  Name:  Onyx Edgley    MRN: [id number] DOB: 04/09/1933  09/13/2020  Ms. Oshel was observed post Covid-19 immunization for 15 minutes without incident. She was provided with Vaccine Information Sheet and instruction to access the V-Safe system.   Ms. Lipson was instructed to call 911 with any severe reactions post vaccine: Marland Kitchen Difficulty breathing  . Swelling of face and throat  . A fast heartbeat  . A bad rash all over body  . Dizziness and weakness

## 2020-09-15 DIAGNOSIS — F0390 Unspecified dementia without behavioral disturbance: Secondary | ICD-10-CM | POA: Insufficient documentation

## 2020-09-15 DIAGNOSIS — M19012 Primary osteoarthritis, left shoulder: Secondary | ICD-10-CM | POA: Diagnosis not present

## 2020-09-15 DIAGNOSIS — I10 Essential (primary) hypertension: Secondary | ICD-10-CM | POA: Diagnosis not present

## 2020-09-15 DIAGNOSIS — D509 Iron deficiency anemia, unspecified: Secondary | ICD-10-CM | POA: Diagnosis not present

## 2020-09-15 DIAGNOSIS — D649 Anemia, unspecified: Secondary | ICD-10-CM | POA: Diagnosis not present

## 2020-09-15 MED ORDER — MEMANTINE HCL 5 MG PO TABS: 5.0000 mg | ORAL_TABLET | Freq: Two times a day (BID) | ORAL | 5 refills | Status: DC

## 2020-09-15 NOTE — Addendum Note (Signed)
Addended by: Brandon Melnick on: 09/15/2020 05:15 PM   Modules accepted: Orders

## 2020-09-15 NOTE — Telephone Encounter (Signed)
I called and spoke to [facility]. [name] and relayed that ok to start memantine 5mg  po bid for [name].  [facility]. She verbalized understanding.  Will call back as needed.

## 2020-09-15 NOTE — Telephone Encounter (Signed)
Order for Namenda 5 mg BID placed.

## 2020-09-16 DIAGNOSIS — R42 Dizziness and giddiness: Secondary | ICD-10-CM | POA: Insufficient documentation

## 2020-09-16 DIAGNOSIS — R296 Repeated falls: Secondary | ICD-10-CM | POA: Insufficient documentation

## 2020-09-16 DIAGNOSIS — M19011 Primary osteoarthritis, right shoulder: Secondary | ICD-10-CM | POA: Diagnosis not present

## 2020-09-20 DIAGNOSIS — R42 Dizziness and giddiness: Secondary | ICD-10-CM | POA: Diagnosis not present

## 2020-09-20 DIAGNOSIS — R296 Repeated falls: Secondary | ICD-10-CM | POA: Diagnosis not present

## 2020-09-20 DIAGNOSIS — G43909 Migraine, unspecified, not intractable, without status migrainosus: Secondary | ICD-10-CM | POA: Diagnosis not present

## 2020-09-20 DIAGNOSIS — I739 Peripheral vascular disease, unspecified: Secondary | ICD-10-CM | POA: Diagnosis not present

## 2020-09-20 DIAGNOSIS — M19011 Primary osteoarthritis, right shoulder: Secondary | ICD-10-CM | POA: Diagnosis not present

## 2020-09-20 DIAGNOSIS — I10 Essential (primary) hypertension: Secondary | ICD-10-CM | POA: Diagnosis not present

## 2020-09-20 DIAGNOSIS — G8929 Other chronic pain: Secondary | ICD-10-CM | POA: Diagnosis not present

## 2020-09-20 DIAGNOSIS — Z87891 Personal history of nicotine dependence: Secondary | ICD-10-CM | POA: Diagnosis not present

## 2020-09-20 DIAGNOSIS — M810 Age-related osteoporosis without current pathological fracture: Secondary | ICD-10-CM | POA: Diagnosis not present

## 2020-09-20 DIAGNOSIS — E440 Moderate protein-calorie malnutrition: Secondary | ICD-10-CM | POA: Diagnosis not present

## 2020-09-21 ENCOUNTER — Other Ambulatory Visit: Payer: Self-pay

## 2020-09-21 ENCOUNTER — Encounter (HOSPITAL_COMMUNITY): Payer: Self-pay | Admitting: *Deleted

## 2020-09-21 ENCOUNTER — Ambulatory Visit (HOSPITAL_COMMUNITY)
Admission: EM | Admit: 2020-09-21 | Discharge: 2020-09-21 | Disposition: A | Discharge status: Home / Self Care | Payer: Medicare Other | Attending: Family Medicine | Admitting: Family Medicine

## 2020-09-21 ENCOUNTER — Ambulatory Visit (INDEPENDENT_AMBULATORY_CARE_PROVIDER_SITE_OTHER): Payer: Medicare Other

## 2020-09-21 DIAGNOSIS — J9811 Atelectasis: Secondary | ICD-10-CM | POA: Diagnosis not present

## 2020-09-21 DIAGNOSIS — R062 Wheezing: Secondary | ICD-10-CM

## 2020-09-21 DIAGNOSIS — R7981 Abnormal blood-gas level: Secondary | ICD-10-CM | POA: Insufficient documentation

## 2020-09-21 DIAGNOSIS — J90 Pleural effusion, not elsewhere classified: Secondary | ICD-10-CM | POA: Diagnosis not present

## 2020-09-21 DIAGNOSIS — R0602 Shortness of breath: Secondary | ICD-10-CM | POA: Diagnosis not present

## 2020-09-21 LAB — CBC
HCT: 40.1 % (ref 36.0–46.0)
Hemoglobin: 12.6 g/dL (ref 12.0–15.0)
MCH: 28.2 pg (ref 26.0–34.0)
MCHC: 31.4 g/dL (ref 30.0–36.0)
MCV: 89.7 fL (ref 80.0–100.0)
Platelets: 312 10*3/uL (ref 150–400)
RBC: 4.47 MIL/uL (ref 3.87–5.11)
RDW: 14.8 % (ref 11.5–15.5)
WBC: 6.7 10*3/uL (ref 4.0–10.5)
nRBC: 0 % (ref 0.0–0.2)

## 2020-09-21 MED ORDER — DOXYCYCLINE HYCLATE 100 MG PO CAPS
100.0000 mg | ORAL_CAPSULE | Freq: Two times a day (BID) | ORAL | 0 refills | Status: DC
Start: 2020-09-21 — End: 2020-10-29

## 2020-09-21 NOTE — ED Triage Notes (Signed)
Immediately after ambulating from waiting area to triage room, SaO2 = 93%.  Pt denied any SOB.  Denies any c/o's at this time. Per spouse and granddaughter, pt had in-home PT eval yesterday; was told 94% SaO2 at rest, which dropped to 84% with SOB upon ambulating.  PCP informed pt and family to go to [facility] today to eval, as it may be related to her recent memory changes.

## 2020-09-21 NOTE — Discharge Instructions (Addendum)
You have been seen at the [facility] today for a low oxygen level on exertion. Your evaluation today was not suggestive of any emergent condition requiring medical intervention at this time. Your chest x-ray showed very mild changes that may represent a bronchitis. Take the antibiotic prescribe to cover for any early lung infection. It's very important that you pay attention to any new symptoms or worsening of your current condition.  Your blood counts were within normal limits.  Please proceed directly to the Emergency Department immediately should you feel worse in any way.

## 2020-09-22 NOTE — ED Provider Notes (Signed)
Amanda [name]   [id number] 09/21/20 Arrival Time: 0240  ASSESSMENT & PLAN:  1. Low oxygen saturation   Upon exertion. No SOB or CP.  CBC WNL.  I have personally viewed the imaging studies ordered this visit. No appreciable infiltrates. See radiology report below. Will cover with doxycycline. Discussed.  Begin: Meds ordered this encounter  Medications  . doxycycline (VIBRAMYCIN) 100 MG capsule    Sig: Take 1 capsule (100 mg total) by mouth 2 (two) times daily.    Dispense:  14 capsule    Refill:  0     Discharge Instructions     You have been seen at the [facility] today for a low oxygen level on exertion. Your evaluation today was not suggestive of any emergent condition requiring medical intervention at this time. Your chest x-ray showed very mild changes that may represent a bronchitis. Take the antibiotic prescribe to cover for any early lung infection. It's very important that you pay attention to any new symptoms or worsening of your current condition.  Your blood counts were within normal limits.  Please proceed directly to the Emergency Department immediately should you feel worse in any way.     Recommend:  Follow-up Information    Schedule an appointment as soon as possible for a visit  with Caren Macadam, MD.   Specialty: Family Medicine Contact information: [address] [phone]               Reviewed expectations re: course of current medical issues. Questions answered. Outlined signs and symptoms indicating need for more acute intervention. Patient verbalized understanding. After Visit Summary given.   SUBJECTIVE:  History from: patient and family members. Amanda [name] is a 86 y.o. female who reports home health nurse found oxygen saturations dipping into the mid to upper 80s upon exertion yesterday. PCP recommended evaluation. No associated SOB or CP. Afebrile. No recent illnesses. No  orthopnea or PND. No LE edema. Has been feeling her normal self. Family members report no confusion or mental status changes different from baseline. Ambulatory without difficulty. Denies: fatigue, irregular heart beat, palpitations and syncope.  Social History   Tobacco Use  Smoking Status Former Smoker  Smokeless Tobacco Never Used   Social History   Substance and Sexual Activity  Alcohol Use No     OBJECTIVE:  Vitals:   09/21/20 1603  BP: 121/73  Pulse: 76  Resp: (!) 24  Temp: 98.3 F (36.8 C)  TempSrc: Oral  SpO2: 96%    Recheck RR: 18 and unlabored.  General appearance: alert, oriented, no acute distress Eyes: PERRLA; EOMI; conjunctivae normal HENT: normocephalic; atraumatic Neck: supple with FROM Lungs: without labored respirations; speaks full sentences without difficulty; CTAB Heart: regular rate and rhythm Chest Wall: without tenderness to palpation Abdomen: soft Extremities: without edema; without calf swelling or tenderness; symmetrical without gross deformities Skin: warm and dry; without rash or lesions Neuro: normal gait Psychological: alert and cooperative; normal mood and affect  Labs: Results for orders placed or performed during the hospital encounter of 09/21/20  CBC  Result Value Ref Range   WBC 6.7 4.0 - 10.5 K/uL   RBC 4.47 3.87 - 5.11 MIL/uL   Hemoglobin 12.6 12.0 - 15.0 g/dL   HCT 40.1 36 - 46 %   MCV 89.7 80.0 - 100.0 fL   MCH 28.2 26.0 - 34.0 pg   MCHC 31.4 30.0 - 36.0 g/dL   RDW 14.8 11.5 -  15.5 %   Platelets 312 150 - 400 K/uL   nRBC 0.0 0.0 - 0.2 %   Labs Reviewed  CBC    Imaging: DG Chest 2 View  Result Date: 09/21/2020 CLINICAL DATA:  Decreased oxygen saturation with exertion. Shortness of breath and wheezing. EXAM: CHEST - 2 VIEW COMPARISON:  Radiograph 12/02/2019 FINDINGS: Heart is normal in size. Unchanged mediastinal contours with aortic tortuosity. There is peribronchial thickening that is new from prior exam.  Minimal basilar atelectasis. No confluent airspace disease. No minimal fluid in the fissures without significant sub pulmonic effusion. No pneumothorax. No acute osseous abnormalities are seen, degenerative change of the right shoulder. IMPRESSION: Peribronchial thickening, new from prior exam, may be pulmonary edema or bronchitis. Trace fluid in the fissures without significant sub pulmonic effusion. Electronically Signed   By: Keith Rake M.D.   On: 09/21/2020 17:35     No Known Allergies  Past Medical History:  Diagnosis Date  . Depression   . Hyperlipidemia   . Hypertension   . Memory disorder   . Osteoporosis   . PVC (premature ventricular contraction)    Social History   Socioeconomic History  . Marital status: Divorced    Spouse name: Not on file  . Number of children: Not on file  . Years of education: Not on file  . Highest education level: Not on file  Occupational History  . Not on file  Tobacco Use  . Smoking status: Former Research scientist (life sciences)  . Smokeless tobacco: Never Used  Substance and Sexual Activity  . Alcohol use: No  . Drug use: No  . Sexual activity: Not on file  Other Topics Concern  . Not on file  Social History Narrative   Worked in [employer].   Divorced 10    Lives alone   Son lives in town and two daughter lives out of town.       Social Determinants of Health   Financial Resource Strain:   . Difficulty of Paying Living Expenses: Not on file  Food Insecurity:   . Worried About Charity fundraiser in the Last Year: Not on file  . Ran Out of Food in the Last Year: Not on file  Transportation Needs:   . Lack of Transportation (Medical): Not on file  . Lack of Transportation (Non-Medical): Not on file  Physical Activity:   . Days of Exercise per Week: Not on file  . Minutes of Exercise per Session: Not on file  Stress:   . Feeling of Stress : Not on file  Social Connections:   . Frequency of Communication with Friends and Family: Not on file  .  Frequency of Social Gatherings with Friends and Family: Not on file  . Attends Religious Services: Not on file  . Active Member of Clubs or Organizations: Not on file  . Attends Archivist Meetings: Not on file  . Marital Status: Not on file  Intimate Partner Violence:   . Fear of Current or Ex-Partner: Not on file  . Emotionally Abused: Not on file  . Physically Abused: Not on file  . Sexually Abused: Not on file   History reviewed. No pertinent family history. Past Surgical History:  Procedure Laterality Date  . PARTIAL HYSTERECTOMY       Vanessa Kick, MD 09/22/20 1015

## 2020-09-23 DIAGNOSIS — I82461 Acute embolism and thrombosis of right calf muscular vein: Secondary | ICD-10-CM | POA: Diagnosis not present

## 2020-09-23 DIAGNOSIS — R600 Localized edema: Secondary | ICD-10-CM | POA: Diagnosis not present

## 2020-09-23 DIAGNOSIS — R0602 Shortness of breath: Secondary | ICD-10-CM | POA: Diagnosis not present

## 2020-09-27 ENCOUNTER — Other Ambulatory Visit: Payer: Self-pay | Admitting: Family Medicine

## 2020-09-27 DIAGNOSIS — R0609 Other forms of dyspnea: Secondary | ICD-10-CM

## 2020-09-27 DIAGNOSIS — R0600 Dyspnea, unspecified: Secondary | ICD-10-CM

## 2020-09-27 DIAGNOSIS — R0602 Shortness of breath: Secondary | ICD-10-CM

## 2020-09-27 DIAGNOSIS — R0902 Hypoxemia: Secondary | ICD-10-CM

## 2020-10-01 DIAGNOSIS — D649 Anemia, unspecified: Secondary | ICD-10-CM | POA: Diagnosis not present

## 2020-10-01 DIAGNOSIS — I10 Essential (primary) hypertension: Secondary | ICD-10-CM | POA: Diagnosis not present

## 2020-10-01 DIAGNOSIS — M810 Age-related osteoporosis without current pathological fracture: Secondary | ICD-10-CM | POA: Diagnosis not present

## 2020-10-01 DIAGNOSIS — R42 Dizziness and giddiness: Secondary | ICD-10-CM | POA: Diagnosis not present

## 2020-10-01 DIAGNOSIS — E440 Moderate protein-calorie malnutrition: Secondary | ICD-10-CM | POA: Diagnosis not present

## 2020-10-01 DIAGNOSIS — M19011 Primary osteoarthritis, right shoulder: Secondary | ICD-10-CM | POA: Diagnosis not present

## 2020-10-01 DIAGNOSIS — D509 Iron deficiency anemia, unspecified: Secondary | ICD-10-CM | POA: Diagnosis not present

## 2020-10-01 DIAGNOSIS — I739 Peripheral vascular disease, unspecified: Secondary | ICD-10-CM | POA: Diagnosis not present

## 2020-10-01 DIAGNOSIS — Z87891 Personal history of nicotine dependence: Secondary | ICD-10-CM | POA: Diagnosis not present

## 2020-10-01 DIAGNOSIS — G8929 Other chronic pain: Secondary | ICD-10-CM | POA: Diagnosis not present

## 2020-10-01 DIAGNOSIS — G43909 Migraine, unspecified, not intractable, without status migrainosus: Secondary | ICD-10-CM | POA: Diagnosis not present

## 2020-10-01 DIAGNOSIS — R296 Repeated falls: Secondary | ICD-10-CM | POA: Diagnosis not present

## 2020-10-04 DIAGNOSIS — M810 Age-related osteoporosis without current pathological fracture: Secondary | ICD-10-CM | POA: Diagnosis not present

## 2020-10-04 DIAGNOSIS — R296 Repeated falls: Secondary | ICD-10-CM | POA: Diagnosis not present

## 2020-10-04 DIAGNOSIS — I739 Peripheral vascular disease, unspecified: Secondary | ICD-10-CM | POA: Diagnosis not present

## 2020-10-04 DIAGNOSIS — G43909 Migraine, unspecified, not intractable, without status migrainosus: Secondary | ICD-10-CM | POA: Diagnosis not present

## 2020-10-04 DIAGNOSIS — R42 Dizziness and giddiness: Secondary | ICD-10-CM | POA: Diagnosis not present

## 2020-10-04 DIAGNOSIS — G8929 Other chronic pain: Secondary | ICD-10-CM | POA: Diagnosis not present

## 2020-10-04 DIAGNOSIS — I10 Essential (primary) hypertension: Secondary | ICD-10-CM | POA: Diagnosis not present

## 2020-10-04 DIAGNOSIS — M19011 Primary osteoarthritis, right shoulder: Secondary | ICD-10-CM | POA: Diagnosis not present

## 2020-10-04 DIAGNOSIS — Z87891 Personal history of nicotine dependence: Secondary | ICD-10-CM | POA: Diagnosis not present

## 2020-10-04 DIAGNOSIS — E440 Moderate protein-calorie malnutrition: Secondary | ICD-10-CM | POA: Diagnosis not present

## 2020-10-06 DIAGNOSIS — Z87891 Personal history of nicotine dependence: Secondary | ICD-10-CM | POA: Diagnosis not present

## 2020-10-06 DIAGNOSIS — M19011 Primary osteoarthritis, right shoulder: Secondary | ICD-10-CM | POA: Diagnosis not present

## 2020-10-06 DIAGNOSIS — M810 Age-related osteoporosis without current pathological fracture: Secondary | ICD-10-CM | POA: Diagnosis not present

## 2020-10-06 DIAGNOSIS — R42 Dizziness and giddiness: Secondary | ICD-10-CM | POA: Diagnosis not present

## 2020-10-06 DIAGNOSIS — I10 Essential (primary) hypertension: Secondary | ICD-10-CM | POA: Diagnosis not present

## 2020-10-06 DIAGNOSIS — E440 Moderate protein-calorie malnutrition: Secondary | ICD-10-CM | POA: Diagnosis not present

## 2020-10-06 DIAGNOSIS — R296 Repeated falls: Secondary | ICD-10-CM | POA: Diagnosis not present

## 2020-10-06 DIAGNOSIS — I739 Peripheral vascular disease, unspecified: Secondary | ICD-10-CM | POA: Diagnosis not present

## 2020-10-06 DIAGNOSIS — G8929 Other chronic pain: Secondary | ICD-10-CM | POA: Diagnosis not present

## 2020-10-06 DIAGNOSIS — G43909 Migraine, unspecified, not intractable, without status migrainosus: Secondary | ICD-10-CM | POA: Diagnosis not present

## 2020-10-11 DIAGNOSIS — I10 Essential (primary) hypertension: Secondary | ICD-10-CM | POA: Diagnosis not present

## 2020-10-11 DIAGNOSIS — E538 Deficiency of other specified B group vitamins: Secondary | ICD-10-CM | POA: Diagnosis not present

## 2020-10-11 DIAGNOSIS — R0600 Dyspnea, unspecified: Secondary | ICD-10-CM | POA: Diagnosis not present

## 2020-10-11 DIAGNOSIS — I82431 Acute embolism and thrombosis of right popliteal vein: Secondary | ICD-10-CM | POA: Diagnosis not present

## 2020-10-13 ENCOUNTER — Other Ambulatory Visit: Payer: Medicare Other

## 2020-10-13 ENCOUNTER — Other Ambulatory Visit: Payer: Self-pay

## 2020-10-13 ENCOUNTER — Ambulatory Visit
Admission: RE | Admit: 2020-10-13 | Discharge: 2020-10-13 | Disposition: A | Discharge status: Home / Self Care | Payer: Medicare Other | Source: Ambulatory Visit | Attending: Family Medicine | Admitting: Family Medicine

## 2020-10-13 DIAGNOSIS — M19011 Primary osteoarthritis, right shoulder: Secondary | ICD-10-CM | POA: Diagnosis not present

## 2020-10-13 DIAGNOSIS — G8929 Other chronic pain: Secondary | ICD-10-CM | POA: Diagnosis not present

## 2020-10-13 DIAGNOSIS — R0609 Other forms of dyspnea: Secondary | ICD-10-CM

## 2020-10-13 DIAGNOSIS — R0602 Shortness of breath: Secondary | ICD-10-CM

## 2020-10-13 DIAGNOSIS — E440 Moderate protein-calorie malnutrition: Secondary | ICD-10-CM | POA: Diagnosis not present

## 2020-10-13 DIAGNOSIS — M47814 Spondylosis without myelopathy or radiculopathy, thoracic region: Secondary | ICD-10-CM | POA: Diagnosis not present

## 2020-10-13 DIAGNOSIS — I739 Peripheral vascular disease, unspecified: Secondary | ICD-10-CM | POA: Diagnosis not present

## 2020-10-13 DIAGNOSIS — G43909 Migraine, unspecified, not intractable, without status migrainosus: Secondary | ICD-10-CM | POA: Diagnosis not present

## 2020-10-13 DIAGNOSIS — M810 Age-related osteoporosis without current pathological fracture: Secondary | ICD-10-CM | POA: Diagnosis not present

## 2020-10-13 DIAGNOSIS — J439 Emphysema, unspecified: Secondary | ICD-10-CM | POA: Diagnosis not present

## 2020-10-13 DIAGNOSIS — Z87891 Personal history of nicotine dependence: Secondary | ICD-10-CM | POA: Diagnosis not present

## 2020-10-13 DIAGNOSIS — R0600 Dyspnea, unspecified: Secondary | ICD-10-CM

## 2020-10-13 DIAGNOSIS — R0902 Hypoxemia: Secondary | ICD-10-CM

## 2020-10-13 DIAGNOSIS — I700 Atherosclerosis of aorta: Secondary | ICD-10-CM | POA: Diagnosis not present

## 2020-10-13 DIAGNOSIS — I10 Essential (primary) hypertension: Secondary | ICD-10-CM | POA: Diagnosis not present

## 2020-10-13 DIAGNOSIS — R42 Dizziness and giddiness: Secondary | ICD-10-CM | POA: Diagnosis not present

## 2020-10-13 DIAGNOSIS — J9811 Atelectasis: Secondary | ICD-10-CM | POA: Diagnosis not present

## 2020-10-13 DIAGNOSIS — R296 Repeated falls: Secondary | ICD-10-CM | POA: Diagnosis not present

## 2020-10-13 MED ORDER — IOPAMIDOL (ISOVUE-300) INJECTION 61%
75.0000 mL | Freq: Once | INTRAVENOUS | Status: AC | PRN
  Administered 2020-10-13: 75 mL via INTRAVENOUS

## 2020-10-19 DIAGNOSIS — Z87891 Personal history of nicotine dependence: Secondary | ICD-10-CM | POA: Diagnosis not present

## 2020-10-19 DIAGNOSIS — G43909 Migraine, unspecified, not intractable, without status migrainosus: Secondary | ICD-10-CM | POA: Diagnosis not present

## 2020-10-19 DIAGNOSIS — M810 Age-related osteoporosis without current pathological fracture: Secondary | ICD-10-CM | POA: Diagnosis not present

## 2020-10-19 DIAGNOSIS — R42 Dizziness and giddiness: Secondary | ICD-10-CM | POA: Diagnosis not present

## 2020-10-19 DIAGNOSIS — E440 Moderate protein-calorie malnutrition: Secondary | ICD-10-CM | POA: Diagnosis not present

## 2020-10-19 DIAGNOSIS — I10 Essential (primary) hypertension: Secondary | ICD-10-CM | POA: Diagnosis not present

## 2020-10-19 DIAGNOSIS — I739 Peripheral vascular disease, unspecified: Secondary | ICD-10-CM | POA: Diagnosis not present

## 2020-10-19 DIAGNOSIS — M19011 Primary osteoarthritis, right shoulder: Secondary | ICD-10-CM | POA: Diagnosis not present

## 2020-10-19 DIAGNOSIS — R296 Repeated falls: Secondary | ICD-10-CM | POA: Diagnosis not present

## 2020-10-19 DIAGNOSIS — G8929 Other chronic pain: Secondary | ICD-10-CM | POA: Diagnosis not present

## 2020-10-26 DIAGNOSIS — G43909 Migraine, unspecified, not intractable, without status migrainosus: Secondary | ICD-10-CM | POA: Diagnosis not present

## 2020-10-26 DIAGNOSIS — I10 Essential (primary) hypertension: Secondary | ICD-10-CM | POA: Diagnosis not present

## 2020-10-26 DIAGNOSIS — I739 Peripheral vascular disease, unspecified: Secondary | ICD-10-CM | POA: Diagnosis not present

## 2020-10-26 DIAGNOSIS — E440 Moderate protein-calorie malnutrition: Secondary | ICD-10-CM | POA: Diagnosis not present

## 2020-10-26 DIAGNOSIS — M810 Age-related osteoporosis without current pathological fracture: Secondary | ICD-10-CM | POA: Diagnosis not present

## 2020-10-26 DIAGNOSIS — M19011 Primary osteoarthritis, right shoulder: Secondary | ICD-10-CM | POA: Diagnosis not present

## 2020-10-26 DIAGNOSIS — R42 Dizziness and giddiness: Secondary | ICD-10-CM | POA: Diagnosis not present

## 2020-10-26 DIAGNOSIS — R296 Repeated falls: Secondary | ICD-10-CM | POA: Diagnosis not present

## 2020-10-26 DIAGNOSIS — Z87891 Personal history of nicotine dependence: Secondary | ICD-10-CM | POA: Diagnosis not present

## 2020-10-26 DIAGNOSIS — G8929 Other chronic pain: Secondary | ICD-10-CM | POA: Diagnosis not present

## 2020-10-29 ENCOUNTER — Ambulatory Visit: Payer: Medicare Other | Admitting: Internal Medicine

## 2020-10-29 ENCOUNTER — Encounter: Payer: Self-pay | Admitting: Internal Medicine

## 2020-10-29 ENCOUNTER — Other Ambulatory Visit: Payer: Self-pay

## 2020-10-29 VITALS — BP 110/70 | HR 73 | Temp 98.4°F | Ht 61.0 in | Wt 117.2 lb

## 2020-10-29 DIAGNOSIS — J432 Centrilobular emphysema: Secondary | ICD-10-CM | POA: Diagnosis not present

## 2020-10-29 DIAGNOSIS — R0602 Shortness of breath: Secondary | ICD-10-CM

## 2020-10-29 MED ORDER — ALBUTEROL SULFATE HFA 108 (90 BASE) MCG/ACT IN AERS: 2.0000 | INHALATION_SPRAY | Freq: Four times a day (QID) | RESPIRATORY_TRACT | 5 refills | Status: DC | PRN

## 2020-10-29 NOTE — Patient Instructions (Signed)
The patient should have follow up scheduled with myself in 6 weeks  Prior to next visit patient should have: Full set of PFTs Echocardiogram  Take the albuterol rescue inhaler every 4 to 6 hours as needed for wheezing or shortness of breath. You can also take it 15 minutes before exercise or exertional activity. Side effects include heart racing or pounding, jitters or anxiety. If you have a history of an irregular heart rhythm, it can make this worse. Can also give some patients a hard time sleeping.  To inhale the aerosol using an inhaler, follow these steps:  1. Remove the protective dust cap from the end of the mouthpiece. If the dust cap was not placed on the mouthpiece, check the mouthpiece for dirt or other objects. Be sure that the canister is fully and firmly inserted in the mouthpiece. 2. If you are using the inhaler for the first time or if you have not used the inhaler in more than 14 days, you will need to prime it. You may also need to prime the inhaler if it has been dropped. Ask your pharmacist or check the manufacturer's information if this happens. To prime the inhaler, shake it well and then press down on the canister 4 times to release 4 sprays into the air, away from your face. Be careful not to get albuterol in your eyes. 3. Shake the inhaler well. 4. Breathe out as completely as possible through your mouth. 4. Hold the canister with the mouthpiece on the bottom, facing you and the canister pointing upward. Place the open end of the mouthpiece into your mouth. Close your lips tightly around the mouthpiece. 6. Breathe in slowly and deeply through the mouthpiece.At the same time, press down once on the container to spray the medication into your mouth. 7. Try to hold your breath for 10 seconds. remove the inhaler, and breathe out slowly. 8. If you were told to use 2 puffs, wait 1 minute and then repeat steps 3-7. 9. Replace the protective cap on the inhaler. 10. Clean your  inhaler regularly. Follow the manufacturer's directions carefully and ask your doctor or pharmacist if you have any questions about cleaning your inhaler.  Check the back of the inhaler to keep track of the total number of doses left on the inhaler.

## 2020-10-29 NOTE — Progress Notes (Signed)
Amanda [name]    [id number]    02-21-1933  Primary Care Physician:Hagler, Apolonio Schneiders, MD  Referring Physician: Caren Macadam, [place] [place],  [place] [address] Reason for Consultation: shortness of breath Date of Consultation: 10/29/2020  Chief complaint:   Chief Complaint  Patient presents with  . Consult    Sob with minimal exertion.  Sob for 6 months.     HPI: Amanda [name] is a 86 y.o. woman with history of tobacco use disorder, dementia, and osteoporosis who presents for new patient evaluation of dyspnea.  She has been doing physical therapy for gait and balance and they learned that she was hypoxemic 82-85% on room air. Resolves with rest.  She was seen in urgent care in November when she was found to be hypoxemic by a home health nurse. The patient now mentions she has been short of breath for the past six months when she is exerting herself. She does not usually feel sob at rest. She was treated with antibiotics and continues to feel sob.  She does not take any inhalers for her breathing.  She has chest tightness and some cough with scant mucus production. Symptoms also worse with when she gets upset.  Occasional palpitations.  Never had pneumonia or bronchitis, no childhood respiratory disease.   No nocturnal symptoms, no wheezing.    Also diagnosed with DVT in the left lower extremity, started on xarelto. November 2nd. She had a fall around that time and went to the ED. Was having lightheadedness and falling. No bleeding issues since starting xarelto.   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  Exposures: lives at home independently without any impairment in ADLs because of her breathing.  Smoking history: remote smoke exposure, son and husband smoked.   Social History   Occupational History  . Not on file  Tobacco Use  . Smoking status: Former Smoker    Years: 17.00    Types: Cigarettes     Start date: 10/29/1954    Quit date: 10/30/1971    Years since quitting: 49.0  . Smokeless tobacco: Never Used  . Tobacco comment: smoked 3-4/day  Substance and Sexual Activity  . Alcohol use: No  . Drug use: No  . Sexual activity: Not on file    Relevant family history:  Family History  Problem Relation Age of Onset  . Lung cancer Neg Hx     Past Medical History:  Diagnosis Date  . Depression   . Hyperlipidemia   . Hypertension   . Memory disorder   . Osteoporosis   . PVC (premature ventricular contraction)     Past Surgical History:  Procedure Laterality Date  . PARTIAL HYSTERECTOMY       Physical Exam: Blood pressure 110/70, pulse 73, temperature 98.4 F (36.9 C), temperature source Temporal, height 5\' 1"  (1.549 m), weight 117 lb 3.2 oz (53.2 kg), SpO2 (S) (!) 85 %. Gen:      No acute distress ENT:  no nasal polyps, mucus membranes moist Lungs:    Kyphosis, No increased respiratory effort, diminished upper bases, symmetric chest wall excursion, no wheezes or crackles CV:         Regular rate and rhythm; no murmurs, rubs, or gallops.  No pedal edema Abd:      + bowel sounds; soft, non-tender; no distension MSK: no acute synovitis of DIP or PIP joints, no mechanics hands.  Skin:  Warm and dry; no rashes Neuro: normal speech, no focal facial asymmetry Psych: alert and oriented x3, normal mood and affect Ext: thin, mild bruising noted.   Data Reviewed/Medical Decision Making:  Independent interpretation of tests: Imaging: . Review of patient's CT scan Chest Nov 24 images revealed diffuse centrilobular emphysema. The patient's images have been independently reviewed by me.    PFTs: None on file  Labs: Lab Results  Component Value Date   WBC 6.7 09/21/2020   HGB 12.6 09/21/2020   HCT 40.1 09/21/2020   MCV 89.7 09/21/2020   PLT 312 09/21/2020   Lab Results  Component Value Date   NA 138 05/04/2020   K 3.4 (L) 05/04/2020   CL 105 05/04/2020    CO2 26 05/04/2020     Immunization status:  Immunization History  Administered Date(s) Administered  . Influenza, High Dose Seasonal PF 09/03/2015, 08/22/2016, 10/16/2017, 11/26/2018, 08/01/2019, 09/10/2020  . Influenza,inj,Quad PF,6+ Mos 01/06/2015  . PFIZER SARS-COV-2 Vaccination 12/17/2019, 01/07/2020, 09/13/2020  . Pneumococcal Conjugate-13 10/01/2015, 08/22/2016  . Pneumococcal Polysaccharide-23 11/26/2018  . Tdap 10/25/2011    . I reviewed prior external note(s) from primary care . I reviewed the result(s) of the labs and imaging as noted above.  . I have ordered PFTs   Assessment:  Shortness of breath Centrilobular emphysema Enlarged pulmonary artery Hypoxemia  Plan/Recommendations: Will ambulate for home oxygen today with desaturation study.  Plan is for full set of PFTs and echocardiogram to evaluate She will try a trial of albuterol to see how this affects her breathing.  We discussed disease management and progression at length today regarding emphysema.   Return to Care: Return in about 6 weeks (around 12/10/2020).  Lenice Llamas, MD Pulmonary and [place]  CC: Caren Macadam, MD

## 2020-11-03 DIAGNOSIS — R42 Dizziness and giddiness: Secondary | ICD-10-CM | POA: Diagnosis not present

## 2020-11-03 DIAGNOSIS — G43909 Migraine, unspecified, not intractable, without status migrainosus: Secondary | ICD-10-CM | POA: Diagnosis not present

## 2020-11-03 DIAGNOSIS — I739 Peripheral vascular disease, unspecified: Secondary | ICD-10-CM | POA: Diagnosis not present

## 2020-11-03 DIAGNOSIS — Z87891 Personal history of nicotine dependence: Secondary | ICD-10-CM | POA: Diagnosis not present

## 2020-11-03 DIAGNOSIS — G8929 Other chronic pain: Secondary | ICD-10-CM | POA: Diagnosis not present

## 2020-11-03 DIAGNOSIS — R296 Repeated falls: Secondary | ICD-10-CM | POA: Diagnosis not present

## 2020-11-03 DIAGNOSIS — M810 Age-related osteoporosis without current pathological fracture: Secondary | ICD-10-CM | POA: Diagnosis not present

## 2020-11-03 DIAGNOSIS — I10 Essential (primary) hypertension: Secondary | ICD-10-CM | POA: Diagnosis not present

## 2020-11-03 DIAGNOSIS — M19011 Primary osteoarthritis, right shoulder: Secondary | ICD-10-CM | POA: Diagnosis not present

## 2020-11-03 DIAGNOSIS — E440 Moderate protein-calorie malnutrition: Secondary | ICD-10-CM | POA: Diagnosis not present

## 2020-11-05 DIAGNOSIS — D509 Iron deficiency anemia, unspecified: Secondary | ICD-10-CM | POA: Diagnosis not present

## 2020-11-05 DIAGNOSIS — G8929 Other chronic pain: Secondary | ICD-10-CM | POA: Diagnosis not present

## 2020-11-05 DIAGNOSIS — I10 Essential (primary) hypertension: Secondary | ICD-10-CM | POA: Diagnosis not present

## 2020-11-05 DIAGNOSIS — D649 Anemia, unspecified: Secondary | ICD-10-CM | POA: Diagnosis not present

## 2020-11-08 DIAGNOSIS — R296 Repeated falls: Secondary | ICD-10-CM | POA: Diagnosis not present

## 2020-11-08 DIAGNOSIS — R42 Dizziness and giddiness: Secondary | ICD-10-CM | POA: Diagnosis not present

## 2020-11-08 DIAGNOSIS — Z87891 Personal history of nicotine dependence: Secondary | ICD-10-CM | POA: Diagnosis not present

## 2020-11-08 DIAGNOSIS — G43909 Migraine, unspecified, not intractable, without status migrainosus: Secondary | ICD-10-CM | POA: Diagnosis not present

## 2020-11-08 DIAGNOSIS — E440 Moderate protein-calorie malnutrition: Secondary | ICD-10-CM | POA: Diagnosis not present

## 2020-11-08 DIAGNOSIS — M810 Age-related osteoporosis without current pathological fracture: Secondary | ICD-10-CM | POA: Diagnosis not present

## 2020-11-08 DIAGNOSIS — M19011 Primary osteoarthritis, right shoulder: Secondary | ICD-10-CM | POA: Diagnosis not present

## 2020-11-08 DIAGNOSIS — I10 Essential (primary) hypertension: Secondary | ICD-10-CM | POA: Diagnosis not present

## 2020-11-08 DIAGNOSIS — I739 Peripheral vascular disease, unspecified: Secondary | ICD-10-CM | POA: Diagnosis not present

## 2020-11-08 DIAGNOSIS — G8929 Other chronic pain: Secondary | ICD-10-CM | POA: Diagnosis not present

## 2020-11-10 ENCOUNTER — Other Ambulatory Visit (HOSPITAL_COMMUNITY): Payer: Medicare Other

## 2020-11-15 ENCOUNTER — Ambulatory Visit (HOSPITAL_COMMUNITY)
Admission: RE | Admit: 2020-11-15 | Discharge: 2020-11-15 | Disposition: A | Discharge status: Home / Self Care | Payer: Medicare Other | Source: Ambulatory Visit | Attending: Internal Medicine | Admitting: Internal Medicine

## 2020-11-15 ENCOUNTER — Other Ambulatory Visit: Payer: Self-pay

## 2020-11-15 DIAGNOSIS — Z87891 Personal history of nicotine dependence: Secondary | ICD-10-CM | POA: Diagnosis not present

## 2020-11-15 DIAGNOSIS — E785 Hyperlipidemia, unspecified: Secondary | ICD-10-CM | POA: Diagnosis not present

## 2020-11-15 DIAGNOSIS — I493 Ventricular premature depolarization: Secondary | ICD-10-CM | POA: Insufficient documentation

## 2020-11-15 DIAGNOSIS — I10 Essential (primary) hypertension: Secondary | ICD-10-CM | POA: Insufficient documentation

## 2020-11-15 DIAGNOSIS — I82431 Acute embolism and thrombosis of right popliteal vein: Secondary | ICD-10-CM | POA: Diagnosis not present

## 2020-11-15 DIAGNOSIS — R0600 Dyspnea, unspecified: Secondary | ICD-10-CM | POA: Insufficient documentation

## 2020-11-15 DIAGNOSIS — R7303 Prediabetes: Secondary | ICD-10-CM | POA: Diagnosis not present

## 2020-11-15 DIAGNOSIS — I082 Rheumatic disorders of both aortic and tricuspid valves: Secondary | ICD-10-CM | POA: Insufficient documentation

## 2020-11-15 DIAGNOSIS — R0602 Shortness of breath: Secondary | ICD-10-CM | POA: Diagnosis not present

## 2020-11-15 DIAGNOSIS — E538 Deficiency of other specified B group vitamins: Secondary | ICD-10-CM | POA: Diagnosis not present

## 2020-11-15 DIAGNOSIS — B070 Plantar wart: Secondary | ICD-10-CM | POA: Diagnosis not present

## 2020-11-15 LAB — ECHOCARDIOGRAM COMPLETE
Area-P 1/2: 2.6 cm2
Calc EF: 48.8 %
P 1/2 time: 1063 msec
S' Lateral: 2.7 cm
Single Plane A2C EF: 46.8 %
Single Plane A4C EF: 53.4 %

## 2020-11-15 NOTE — Progress Notes (Signed)
  Echocardiogram 2D Echocardiogram has been performed.  Amanda [name] 11/15/2020, 1:59 PM

## 2020-11-16 DIAGNOSIS — Z03818 Encounter for observation for suspected exposure to other biological agents ruled out: Secondary | ICD-10-CM | POA: Diagnosis not present

## 2020-11-25 ENCOUNTER — Encounter: Payer: Self-pay | Admitting: Podiatry

## 2020-11-25 ENCOUNTER — Other Ambulatory Visit: Payer: Self-pay

## 2020-11-25 ENCOUNTER — Ambulatory Visit: Payer: Medicare Other | Admitting: Podiatry

## 2020-11-25 DIAGNOSIS — M79674 Pain in right toe(s): Secondary | ICD-10-CM | POA: Diagnosis not present

## 2020-11-25 DIAGNOSIS — M2041 Other hammer toe(s) (acquired), right foot: Secondary | ICD-10-CM

## 2020-11-25 DIAGNOSIS — Q828 Other specified congenital malformations of skin: Secondary | ICD-10-CM | POA: Diagnosis not present

## 2020-11-25 DIAGNOSIS — M2042 Other hammer toe(s) (acquired), left foot: Secondary | ICD-10-CM | POA: Diagnosis not present

## 2020-11-25 DIAGNOSIS — B351 Tinea unguium: Secondary | ICD-10-CM

## 2020-11-25 DIAGNOSIS — M79675 Pain in left toe(s): Secondary | ICD-10-CM | POA: Diagnosis not present

## 2020-11-25 NOTE — Progress Notes (Signed)
Subjective:   Patient ID: Amanda [name], female   DOB: 87 y.o.   MRN: [id number]   HPI Patient presents with pain in both feet with nail disease and lesion that is painful and makes shoe gear difficult.  Has tried to take care of this herself and presents with caregiver.  Patient does not smoke likes to be active   Review of Systems  All other systems reviewed and are negative.       Objective:  Physical Exam Vitals and nursing note reviewed.  Constitutional:      Appearance: She is well-developed and well-nourished.  Cardiovascular:     Pulses: Intact distal pulses.  Pulmonary:     Effort: Pulmonary effort is normal.  Musculoskeletal:        General: Normal range of motion.  Skin:    General: Skin is warm.  Neurological:     Mental Status: She is alert.     Neurovascular status found to be mildly diminished bilateral with diminished sharp dull vibratory and pulses that are weak but present.  Muscle strength diminished range of motion subtalar midtarsal joint also diminished.  Patient is found to have numerous keratotic lesions plantar aspect right over left foot that are painful moderate digital deformities bilateral and thickened yellow brittle nailbeds that are painful bilateral one through five     Assessment:  Chronic deformity of both feet with porokeratotic type lesions painful with mycotic nail infection 1-5 both feet painful     Plan:  H&P reviewed condition educated her and her granddaughter on conditions treatment options.  At this point aggressive debridement accomplished of lesions bilateral and nailbeds 1-5 both feet with no iatrogenic bleeding and reappoint for routine care

## 2020-12-03 DIAGNOSIS — Z13220 Encounter for screening for lipoid disorders: Secondary | ICD-10-CM | POA: Diagnosis not present

## 2020-12-03 DIAGNOSIS — G8929 Other chronic pain: Secondary | ICD-10-CM | POA: Diagnosis not present

## 2020-12-03 DIAGNOSIS — D649 Anemia, unspecified: Secondary | ICD-10-CM | POA: Diagnosis not present

## 2020-12-03 DIAGNOSIS — R54 Age-related physical debility: Secondary | ICD-10-CM | POA: Diagnosis not present

## 2020-12-03 DIAGNOSIS — Z742 Need for assistance at home and no other household member able to render care: Secondary | ICD-10-CM | POA: Diagnosis not present

## 2020-12-03 DIAGNOSIS — Z23 Encounter for immunization: Secondary | ICD-10-CM | POA: Diagnosis not present

## 2020-12-03 DIAGNOSIS — I10 Essential (primary) hypertension: Secondary | ICD-10-CM | POA: Diagnosis not present

## 2020-12-03 DIAGNOSIS — M19012 Primary osteoarthritis, left shoulder: Secondary | ICD-10-CM | POA: Diagnosis not present

## 2020-12-03 DIAGNOSIS — Z9181 History of falling: Secondary | ICD-10-CM | POA: Diagnosis not present

## 2020-12-03 DIAGNOSIS — E538 Deficiency of other specified B group vitamins: Secondary | ICD-10-CM | POA: Diagnosis not present

## 2020-12-03 DIAGNOSIS — M19011 Primary osteoarthritis, right shoulder: Secondary | ICD-10-CM | POA: Diagnosis not present

## 2020-12-03 DIAGNOSIS — M79605 Pain in left leg: Secondary | ICD-10-CM | POA: Diagnosis not present

## 2020-12-03 DIAGNOSIS — D509 Iron deficiency anemia, unspecified: Secondary | ICD-10-CM | POA: Diagnosis not present

## 2020-12-03 DIAGNOSIS — Z0000 Encounter for general adult medical examination without abnormal findings: Secondary | ICD-10-CM | POA: Diagnosis not present

## 2020-12-03 DIAGNOSIS — Z136 Encounter for screening for cardiovascular disorders: Secondary | ICD-10-CM | POA: Diagnosis not present

## 2020-12-14 ENCOUNTER — Other Ambulatory Visit (HOSPITAL_COMMUNITY)
Admission: RE | Admit: 2020-12-14 | Discharge: 2020-12-14 | Disposition: A | Discharge status: Home / Self Care | Payer: Medicare Other | Source: Ambulatory Visit | Attending: Internal Medicine | Admitting: Internal Medicine

## 2020-12-14 DIAGNOSIS — Z20822 Contact with and (suspected) exposure to covid-19: Secondary | ICD-10-CM | POA: Diagnosis not present

## 2020-12-14 DIAGNOSIS — Z01812 Encounter for preprocedural laboratory examination: Secondary | ICD-10-CM | POA: Insufficient documentation

## 2020-12-14 LAB — SARS CORONAVIRUS 2 (TAT 6-24 HRS): SARS Coronavirus 2: NEGATIVE

## 2020-12-15 ENCOUNTER — Other Ambulatory Visit (HOSPITAL_COMMUNITY): Payer: Medicare Other

## 2020-12-17 ENCOUNTER — Other Ambulatory Visit: Payer: Self-pay

## 2020-12-17 ENCOUNTER — Ambulatory Visit: Payer: Medicare Other | Admitting: Internal Medicine

## 2020-12-17 ENCOUNTER — Ambulatory Visit (INDEPENDENT_AMBULATORY_CARE_PROVIDER_SITE_OTHER): Payer: Medicare Other | Admitting: Internal Medicine

## 2020-12-17 ENCOUNTER — Encounter: Payer: Self-pay | Admitting: Internal Medicine

## 2020-12-17 VITALS — BP 106/62 | HR 66 | Temp 97.9°F | Ht 61.0 in | Wt 116.0 lb

## 2020-12-17 DIAGNOSIS — J432 Centrilobular emphysema: Secondary | ICD-10-CM | POA: Diagnosis not present

## 2020-12-17 LAB — PULMONARY FUNCTION TEST
DL/VA % pred: 105 %
DL/VA: 4.36 ml/min/mmHg/L
DLCO cor % pred: 65 %
DLCO cor: 10.99 ml/min/mmHg
DLCO unc % pred: 65 %
DLCO unc: 10.99 ml/min/mmHg
FEF 25-75 Post: 1.48 L/sec
FEF 25-75 Pre: 1.37 L/sec
FEF2575-%Change-Post: 8 %
FEF2575-%Pred-Post: 161 %
FEF2575-%Pred-Pre: 149 %
FEV1-%Change-Post: 0 %
FEV1-%Pred-Post: 102 %
FEV1-%Pred-Pre: 102 %
FEV1-Post: 1.19 L
FEV1-Pre: 1.18 L
FEV1FVC-%Change-Post: -1 %
FEV1FVC-%Pred-Pre: 113 %
FEV6-%Change-Post: 2 %
FEV6-%Pred-Post: 100 %
FEV6-%Pred-Pre: 98 %
FEV6-Post: 1.43 L
FEV6-Pre: 1.4 L
FEV6FVC-%Pred-Post: 105 %
FEV6FVC-%Pred-Pre: 105 %
FVC-%Change-Post: 2 %
FVC-%Pred-Post: 95 %
FVC-%Pred-Pre: 93 %
FVC-Post: 1.43 L
FVC-Pre: 1.4 L
Post FEV1/FVC ratio: 83 %
Post FEV6/FVC ratio: 100 %
Pre FEV1/FVC ratio: 84 %
Pre FEV6/FVC Ratio: 100 %
RV % pred: 80 %
RV: 1.87 L
TLC % pred: 75 %
TLC: 3.5 L

## 2020-12-17 NOTE — Progress Notes (Signed)
Full PFT performed today. °

## 2020-12-17 NOTE — Patient Instructions (Signed)
The patient should have follow up scheduled with myself in 6 months.   Take the albuterol rescue inhaler every 4 to 6 hours as needed for wheezing or shortness of breath. You can also take it 15 minutes before exercise or exertional activity. Side effects include heart racing or pounding, jitters or anxiety. If you have a history of an irregular heart rhythm, it can make this worse. Can also give some patients a hard time sleeping.  To inhale the aerosol using an inhaler, follow these steps:  1. Remove the protective dust cap from the end of the mouthpiece. If the dust cap was not placed on the mouthpiece, check the mouthpiece for dirt or other objects. Be sure that the canister is fully and firmly inserted in the mouthpiece. 2. If you are using the inhaler for the first time or if you have not used the inhaler in more than 14 days, you will need to prime it. You may also need to prime the inhaler if it has been dropped. Ask your pharmacist or check the manufacturer's information if this happens. To prime the inhaler, shake it well and then press down on the canister 4 times to release 4 sprays into the air, away from your face. Be careful not to get albuterol in your eyes. 3. Shake the inhaler well. 4. Breathe out as completely as possible through your mouth. 4. Hold the canister with the mouthpiece on the bottom, facing you and the canister pointing upward. Place the open end of the mouthpiece into your mouth. Close your lips tightly around the mouthpiece. 6. Breathe in slowly and deeply through the mouthpiece.At the same time, press down once on the container to spray the medication into your mouth. 7. Try to hold your breath for 10 seconds. remove the inhaler, and breathe out slowly. 8. If you were told to use 2 puffs, wait 1 minute and then repeat steps 3-7. 9. Replace the protective cap on the inhaler. 10. Clean your inhaler regularly. Follow the manufacturer's directions carefully and ask  your doctor or pharmacist if you have any questions about cleaning your inhaler.  Check the back of the inhaler to keep track of the total number of doses left on the inhaler.    

## 2020-12-17 NOTE — Progress Notes (Signed)
Amanda [name]    [id number]    1933/10/23  Primary Care Physician:Hagler, Apolonio Schneiders, MD Date of Appointment: 12/17/2020 Established Patient Visit  Chief complaint:   Chief Complaint  Patient presents with  . Follow-up    PFT done today. Breathing is unchanged since last visit. She never used her albuterol.     HPI: Amanda [name] is a 87 y.o. woman with shortness of breath and history of tobacco use with centrilobular emphysema.  Interval Updates: She is here for follow up today after PFTs for her emphysema. She was started on albuterol inhaler prn at previous visit.   She continues to live at home independently. She has not tried albuterol as needed. Her granddaughter says its because she is stubborn, but she has been sob when going grocery shopping or out walking around, or sometimes even around the house.   I have reviewed the patient's family social and past medical history and updated as appropriate.   Past Medical History:  Diagnosis Date  . Depression   . Hyperlipidemia   . Hypertension   . Memory disorder   . Osteoporosis   . PVC (premature ventricular contraction)     Past Surgical History:  Procedure Laterality Date  . PARTIAL HYSTERECTOMY      Family History  Problem Relation Age of Onset  . Lung cancer Neg Hx     Social History   Occupational History  . Not on file  Tobacco Use  . Smoking status: Former Smoker    Years: 17.00    Types: Cigarettes    Start date: 10/29/1954    Quit date: 10/30/1971    Years since quitting: 49.1  . Smokeless tobacco: Never Used  . Tobacco comment: smoked 3-4/day  Substance and Sexual Activity  . Alcohol use: No  . Drug use: No  . Sexual activity: Not on file     Physical Exam: Blood pressure 106/62, pulse 66, temperature 97.9 F (36.6 C), temperature source Temporal, height 5\' 1"  (1.549 m), weight 116 lb (52.6 kg), SpO2 95 %.  Gen:      No acute distress, mild kyphosis ENT:  no nasal polyps,  mucus membranes moist Lungs:    No increased respiratory effort, symmetric chest wall excursion, clear to auscultation bilaterally, diminished, no wheezes or crackles CV:         Regular rate and rhythm; no murmurs, rubs, or gallops.  No pedal edema   Data Reviewed: Imaging: I have personally reviewed the CT Chest Nov 2021 with centrilobular emphysema  PFTs: PFT Results Latest Ref Rng & Units 12/17/2020  FVC-Pre L 1.40  FVC-Predicted Pre % 93  FVC-Post L 1.43  FVC-Predicted Post % 95  Pre FEV1/FVC % % 84  Post FEV1/FCV % % 83  FEV1-Pre L 1.18  FEV1-Predicted Pre % 102  FEV1-Post L 1.19  DLCO uncorrected ml/min/mmHg 10.99  DLCO UNC% % 65  DLCO corrected ml/min/mmHg 10.99  DLCO COR %Predicted % 65  DLVA Predicted % 105  TLC L 3.50  TLC % Predicted % 75  RV % Predicted % 80   I have personally reviewed the patient's PFTs and they show no airflow limitation without a BD response.   Echocardiogram Left Ventricle: Left ventricular ejection fraction, by estimation, is 50  to 55%. The left ventricle has low normal function. The left ventricle has  no regional wall motion abnormalities. The left ventricular internal  cavity size was normal in size.  There is  no left ventricular hypertrophy. Left ventricular diastolic  parameters are consistent with Grade I diastolic dysfunction (impaired  relaxation).   Right Ventricle: The right ventricular size is normal. No increase in  right ventricular wall thickness. Right ventricular systolic function is  normal. There is normal pulmonary artery systolic pressure. The tricuspid  regurgitant velocity is 2.75 m/s, and  with an assumed right atrial pressure of 3 mmHg, the estimated right  ventricular systolic pressure is 38.1 mmHg.   Left Atrium: Left atrial size was normal in size.   Right Atrium: Right atrial size was normal in size.   Pericardium: There is no evidence of pericardial effusion.   Mitral Valve: The mitral valve is  normal in structure. No evidence of  mitral valve regurgitation. No evidence of mitral valve stenosis.   Tricuspid Valve: The tricuspid valve is normal in structure. Tricuspid  valve regurgitation is moderate . No evidence of tricuspid stenosis.   Aortic Valve: The aortic valve is normal in structure. Aortic valve  regurgitation is mild. Aortic regurgitation PHT measures 1063 msec. Mild  aortic valve sclerosis is present, with no evidence of aortic valve  stenosis.   Pulmonic Valve: The pulmonic valve was normal in structure. Pulmonic valve  regurgitation is not visualized. No evidence of pulmonic stenosis.   Aorta: The aortic root is normal in size and structure.   Venous: The inferior vena cava is normal in size with greater than 50%  respiratory variability, suggesting right atrial pressure of 3 mmHg.   IAS/Shunts: No atrial level shunt detected by color flow Doppler.   Labs: Lab Results  Component Value Date   WBC 6.7 09/21/2020   HGB 12.6 09/21/2020   HCT 40.1 09/21/2020   MCV 89.7 09/21/2020   PLT 312 09/21/2020   Lab Results  Component Value Date   NA 138 05/04/2020   K 3.4 (L) 05/04/2020   CL 105 05/04/2020   CO2 26 05/04/2020     Immunization status: Immunization History  Administered Date(s) Administered  . Influenza, High Dose Seasonal PF 09/03/2015, 08/22/2016, 10/16/2017, 11/26/2018, 08/01/2019, 09/10/2020  . Influenza,inj,Quad PF,6+ Mos 01/06/2015  . PFIZER(Purple Top)SARS-COV-2 Vaccination 12/17/2019, 01/07/2020, 09/13/2020  . Pneumococcal Conjugate-13 10/01/2015, 08/22/2016  . Pneumococcal Polysaccharide-23 11/26/2018  . Tdap 10/25/2011    Assessment:  Centrilobular emphysema Enlarged pulmonary artery Chronic HFrEF  Plan/Recommendations: Echocardiogram shows diastolic dysfunction. She appears euvolemic on exam today. Continue to monitor off diuretics.  Mild centrilobular emphysema - recommend prn albuterol   Return to Care: Return in about  6 months (around 06/16/2021).   Lenice Llamas, MD [facility] [place]

## 2021-01-06 DIAGNOSIS — I10 Essential (primary) hypertension: Secondary | ICD-10-CM | POA: Diagnosis not present

## 2021-01-06 DIAGNOSIS — M19012 Primary osteoarthritis, left shoulder: Secondary | ICD-10-CM | POA: Diagnosis not present

## 2021-01-06 DIAGNOSIS — M19011 Primary osteoarthritis, right shoulder: Secondary | ICD-10-CM | POA: Diagnosis not present

## 2021-01-06 DIAGNOSIS — R54 Age-related physical debility: Secondary | ICD-10-CM | POA: Diagnosis not present

## 2021-01-06 DIAGNOSIS — J432 Centrilobular emphysema: Secondary | ICD-10-CM | POA: Diagnosis not present

## 2021-01-06 DIAGNOSIS — D649 Anemia, unspecified: Secondary | ICD-10-CM | POA: Diagnosis not present

## 2021-01-06 DIAGNOSIS — D509 Iron deficiency anemia, unspecified: Secondary | ICD-10-CM | POA: Diagnosis not present

## 2021-01-06 DIAGNOSIS — G8929 Other chronic pain: Secondary | ICD-10-CM | POA: Diagnosis not present

## 2021-01-18 ENCOUNTER — Encounter (HOSPITAL_BASED_OUTPATIENT_CLINIC_OR_DEPARTMENT_OTHER): Payer: Self-pay | Admitting: Emergency Medicine

## 2021-01-18 ENCOUNTER — Other Ambulatory Visit: Payer: Self-pay

## 2021-01-18 ENCOUNTER — Emergency Department (HOSPITAL_BASED_OUTPATIENT_CLINIC_OR_DEPARTMENT_OTHER)
Admission: EM | Admit: 2021-01-18 | Discharge: 2021-01-19 | Disposition: A | Discharge status: Home / Self Care | Payer: Medicare Other | Attending: Emergency Medicine | Admitting: Emergency Medicine

## 2021-01-18 ENCOUNTER — Emergency Department (HOSPITAL_BASED_OUTPATIENT_CLINIC_OR_DEPARTMENT_OTHER): Payer: Medicare Other

## 2021-01-18 DIAGNOSIS — R42 Dizziness and giddiness: Secondary | ICD-10-CM | POA: Insufficient documentation

## 2021-01-18 DIAGNOSIS — R9431 Abnormal electrocardiogram [ECG] [EKG]: Secondary | ICD-10-CM

## 2021-01-18 DIAGNOSIS — I2510 Atherosclerotic heart disease of native coronary artery without angina pectoris: Secondary | ICD-10-CM | POA: Insufficient documentation

## 2021-01-18 DIAGNOSIS — I4581 Long QT syndrome: Secondary | ICD-10-CM | POA: Insufficient documentation

## 2021-01-18 DIAGNOSIS — R1084 Generalized abdominal pain: Secondary | ICD-10-CM | POA: Insufficient documentation

## 2021-01-18 DIAGNOSIS — E876 Hypokalemia: Secondary | ICD-10-CM | POA: Insufficient documentation

## 2021-01-18 DIAGNOSIS — Z79899 Other long term (current) drug therapy: Secondary | ICD-10-CM | POA: Diagnosis not present

## 2021-01-18 DIAGNOSIS — E860 Dehydration: Secondary | ICD-10-CM | POA: Insufficient documentation

## 2021-01-18 DIAGNOSIS — R109 Unspecified abdominal pain: Secondary | ICD-10-CM | POA: Diagnosis not present

## 2021-01-18 DIAGNOSIS — Z87891 Personal history of nicotine dependence: Secondary | ICD-10-CM | POA: Diagnosis not present

## 2021-01-18 DIAGNOSIS — J439 Emphysema, unspecified: Secondary | ICD-10-CM | POA: Diagnosis not present

## 2021-01-18 DIAGNOSIS — K760 Fatty (change of) liver, not elsewhere classified: Secondary | ICD-10-CM | POA: Diagnosis not present

## 2021-01-18 DIAGNOSIS — I10 Essential (primary) hypertension: Secondary | ICD-10-CM | POA: Insufficient documentation

## 2021-01-18 DIAGNOSIS — Z7901 Long term (current) use of anticoagulants: Secondary | ICD-10-CM | POA: Diagnosis not present

## 2021-01-18 LAB — COMPREHENSIVE METABOLIC PANEL
ALT: 14 U/L (ref 0–44)
AST: 21 U/L (ref 15–41)
Albumin: 3.9 g/dL (ref 3.5–5.0)
Alkaline Phosphatase: 48 U/L (ref 38–126)
Anion gap: 10 (ref 5–15)
BUN: 13 mg/dL (ref 8–23)
CO2: 32 mmol/L (ref 22–32)
Calcium: 9.4 mg/dL (ref 8.9–10.3)
Chloride: 95 mmol/L — ABNORMAL LOW (ref 98–111)
Creatinine, Ser: 0.74 mg/dL (ref 0.44–1.00)
GFR, Estimated: >60 mL/min (ref >60)
Glucose, Bld: 88 mg/dL (ref 70–99)
Potassium: 3.3 mmol/L — ABNORMAL LOW (ref 3.5–5.1)
Sodium: 137 mmol/L (ref 135–145)
Total Bilirubin: 0.1 mg/dL — ABNORMAL LOW (ref 0.3–1.2)
Total Protein: 7.7 g/dL (ref 6.5–8.1)

## 2021-01-18 LAB — URINALYSIS, ROUTINE W REFLEX MICROSCOPIC
Bilirubin Urine: NEGATIVE
Glucose, UA: NEGATIVE mg/dL
Hgb urine dipstick: NEGATIVE
Ketones, ur: NEGATIVE mg/dL
Leukocytes,Ua: NEGATIVE
Nitrite: NEGATIVE
Protein, ur: NEGATIVE mg/dL
Specific Gravity, Urine: 1.015 (ref 1.005–1.030)
pH: 7.5 (ref 5.0–8.0)

## 2021-01-18 LAB — CBC WITH DIFFERENTIAL/PLATELET
Abs Immature Granulocytes: 0.01 10*3/uL (ref 0.00–0.07)
Basophils Absolute: 0.1 10*3/uL (ref 0.0–0.1)
Basophils Relative: 1 %
Eosinophils Absolute: 0.2 10*3/uL (ref 0.0–0.5)
Eosinophils Relative: 3 %
HCT: 36.8 % (ref 36.0–46.0)
Hemoglobin: 11.8 g/dL — ABNORMAL LOW (ref 12.0–15.0)
Immature Granulocytes: 0 %
Lymphocytes Relative: 29 %
Lymphs Abs: 1.8 10*3/uL (ref 0.7–4.0)
MCH: 27.4 pg (ref 26.0–34.0)
MCHC: 32.1 g/dL (ref 30.0–36.0)
MCV: 85.6 fL (ref 80.0–100.0)
Monocytes Absolute: 0.7 10*3/uL (ref 0.1–1.0)
Monocytes Relative: 12 %
Neutro Abs: 3.4 10*3/uL (ref 1.7–7.7)
Neutrophils Relative %: 55 %
Platelets: 340 10*3/uL (ref 150–400)
RBC: 4.3 MIL/uL (ref 3.87–5.11)
RDW: 15.5 % (ref 11.5–15.5)
WBC: 6.2 10*3/uL (ref 4.0–10.5)
nRBC: 0 % (ref 0.0–0.2)

## 2021-01-18 LAB — TROPONIN I (HIGH SENSITIVITY): Troponin I (High Sensitivity): 6 ng/L (ref <18)

## 2021-01-18 LAB — LIPASE, BLOOD: Lipase: 34 U/L (ref 11–51)

## 2021-01-18 LAB — LACTIC ACID, PLASMA: Lactic Acid, Venous: 0.8 mmol/L (ref 0.5–1.9)

## 2021-01-18 MED ORDER — SODIUM CHLORIDE 0.9 % IV BOLUS (SEPSIS)
1000.0000 mL | Freq: Once | INTRAVENOUS | Status: AC
  Administered 2021-01-18: 1000 mL via INTRAVENOUS

## 2021-01-18 MED ORDER — POTASSIUM CHLORIDE CRYS ER 20 MEQ PO TBCR
20.0000 meq | EXTENDED_RELEASE_TABLET | Freq: Once | ORAL | Status: AC
  Administered 2021-01-19: 20 meq via ORAL
  Filled 2021-01-18: qty 1

## 2021-01-18 MED ORDER — IOHEXOL 300 MG/ML  SOLN
100.0000 mL | Freq: Once | INTRAMUSCULAR | Status: AC | PRN
  Administered 2021-01-18: 100 mL via INTRAVENOUS

## 2021-01-18 NOTE — ED Provider Notes (Signed)
[name] EMERG[facility]Y DEPARTMENT Provider Note   CSN: [id number] Arrival date & time: 01/18/21  2049     History Chief Complaint  Patient presents with  . Dizziness   Amanda [name] is a 87 y.o. female who presents to the emergency department from [facility] walk-in clinic for concern for dehydration.  Patient has been experiencing lightheadedness for the last week as well as severe generalized abdominal pain that is worsening at this time.  She denies any nausea or vomiting, denies diarrhea, melena, hematochezia.  Her adult granddaughter is at the bedside and offers collateral information.  Patient denies any chest pain, shortness of breath, palpitations.  She denies any dysuria or hematuria, but endorses urinary urgency.  Her granddaughter stated that she has not been confused or acting abnormally for herself, family checks in on the patient regularly.  Patient has not had any falls.  I personally reviewed this patient's medical records.  History of hypertension, CAD, hyperlipidemia, on Xarelto.    HPI     Past Medical History:  Diagnosis Date  . Depression   . Hyperlipidemia   . Hypertension   . Memory disorder   . Osteoporosis   . PVC (premature ventricular contraction)     Patient Active Problem List   Diagnosis Date Noted  . DOE (dyspnea on exertion) 12/11/2019  . Educated about COVID-19 virus infection 12/11/2019  . Loss of weight 03/01/2016  . Hyperlipidemia 10/25/2015  . PVC (premature ventricular contraction) 01/21/2015  . Hypertensive cardiovascular disease 01/21/2015  . Chest pain 01/05/2015  . HTN (hypertension) 01/05/2015    Past Surgical History:  Procedure Laterality Date  . PARTIAL HYSTERECTOMY       OB History   No obstetric history on file.     Family History  Problem Relation Age of Onset  . Lung cancer Neg Hx     Social History   Tobacco Use  . Smoking status: Former Smoker    Years: 17.00    Types: Cigarettes    Start date:  10/29/1954    Quit date: 10/30/1971    Years since quitting: 49.2  . Smokeless tobacco: Never Used  . Tobacco comment: smoked 3-4/day  Substance Use Topics  . Alcohol use: No  . Drug use: No    Home Medications Prior to Admission medications   Medication Sig Start Date End Date Taking? Authorizing Provider  albuterol (VENTOLIN HFA) 108 (90 Base) MCG/ACT inhaler Inhale 2 puffs into the lungs every 6 (six) hours as needed. 10/29/20   Spero Geralds, MD  amlodipine-atorvastatin (CADUET) 10-10 MG tablet Take 1 tablet by mouth daily.    [provider]  citalopram (CELEXA) 20 MG tablet Take 20 mg by mouth daily.    [provider]  donepezil (ARICEPT) 10 MG tablet Take 10 mg by mouth at bedtime. 09/07/20   [provider]  losartan (COZAAR) 50 MG tablet Take 50 mg by mouth daily.  01/09/20   [provider]  meclizine (ANTIVERT) 25 MG tablet Take 1 tablet (25 mg total) by mouth 3 (three) times daily as needed for dizziness. 05/04/20   Alveria Apley, PA-C  memantine (NAMENDA) 5 MG tablet Take 1 tablet (5 mg total) by mouth 2 (two) times daily. 09/15/20   Ward Givens, NP  mirtazapine (REMERON SOL-TAB) 15 MG disintegrating tablet Take 15 mg by mouth at bedtime.    [provider]  rivaroxaban (XARELTO) 20 MG TABS tablet Take 20 mg by mouth daily with supper.  [provider]  vitamin B-12 (CYANOCOBALAMIN) 1000 MCG tablet Take 1,000 mcg by mouth daily.     [provider]    Allergies    Patient has no known allergies.  Review of Systems   Review of Systems  Constitutional: Positive for activity change. Negative for appetite change, chills, fatigue and fever.  HENT: Negative.   Eyes: Negative.   Respiratory: Negative.   Cardiovascular: Negative.   Gastrointestinal: Positive for abdominal pain. Negative for blood in stool, constipation, diarrhea, nausea and vomiting.  Genitourinary: Positive for urgency. Negative for  decreased urine volume, difficulty urinating, dysuria, flank pain, frequency, hematuria, vaginal bleeding, vaginal discharge and vaginal pain.  Musculoskeletal: Negative.   Skin: Negative.   Neurological: Positive for light-headedness. Negative for dizziness, tremors, syncope, weakness and headaches.  Psychiatric/Behavioral: Negative.     Physical Exam Updated Vital Signs BP (!) 177/79   Pulse (!) 58   Temp 97.9 F (36.6 C) (Oral)   Resp 17   Ht 5' (1.524 m)   Wt 52.6 kg   SpO2 91%   BMI 22.65 kg/m   Physical Exam Vitals and nursing note reviewed.  Constitutional:      Appearance: Normal appearance.  HENT:     Head: Normocephalic and atraumatic.     Nose: Nose normal.     Mouth/Throat:     Mouth: Mucous membranes are moist.     Pharynx: Oropharynx is clear. Uvula midline. No oropharyngeal exudate or posterior oropharyngeal erythema.     Tonsils: No tonsillar exudate.  Eyes:     General: Lids are normal. Vision grossly intact.        Right eye: No discharge.        Left eye: No discharge.     Extraocular Movements: Extraocular movements intact.     Conjunctiva/sclera: Conjunctivae normal.     Pupils: Pupils are equal, round, and reactive to light.  Neck:     Trachea: Trachea and phonation normal.  Cardiovascular:     Rate and Rhythm: Normal rate and regular rhythm.     Pulses: Normal pulses.          Radial pulses are 2+ on the right side and 2+ on the left side.       Dorsalis pedis pulses are 2+ on the right side and 2+ on the left side.     Heart sounds: Normal heart sounds. No murmur heard.   Pulmonary:     Effort: Pulmonary effort is normal. No respiratory distress.     Breath sounds: Normal breath sounds. No wheezing or rales.  Chest:     Chest wall: No lacerations, deformity, swelling, tenderness, crepitus or edema.  Abdominal:     General: Bowel sounds are normal. There is no distension.     Palpations: Abdomen is soft.     Tenderness: There is  generalized abdominal tenderness and tenderness in the left lower quadrant. There is no right CVA tenderness, left CVA tenderness, guarding or rebound.  Musculoskeletal:        General: No deformity.     Cervical back: Neck supple. No rigidity or crepitus. No pain with movement or spinous process tenderness.     Right lower leg: No edema.     Left lower leg: No edema.  Lymphadenopathy:     Cervical: No cervical adenopathy.  Skin:    General: Skin is warm and dry.     Capillary Refill: Capillary refill takes less than 2 seconds.  Neurological:  General: No focal deficit present.     Mental Status: She is alert and oriented to person, place, and time. Mental status is at baseline.     Cranial Nerves: Cranial nerves are intact.     Sensory: Sensation is intact.     Motor: Motor function is intact.     Coordination: Coordination is intact.     Gait: Gait is intact.  Psychiatric:        Mood and Affect: Mood normal.     ED Results / Procedures / Treatments   Labs (all labs ordered are listed, but only abnormal results are displayed) Labs Reviewed  COMPREHENSIVE METABOLIC PANEL - Abnormal; Notable for the following components:      Result Value   Potassium 3.3 (*)    Chloride 95 (*)    Total Bilirubin 0.1 (*)    All other components within normal limits  CBC WITH DIFFERENTIAL/PLATELET - Abnormal; Notable for the following components:   Hemoglobin 11.8 (*)    All other components within normal limits  URINE CULTURE  LACTIC ACID, PLASMA  URINALYSIS, ROUTINE W REFLEX MICROSCOPIC  LIPASE, BLOOD  TROPONIN I (HIGH SENSITIVITY)    EKG EKG Interpretation  Date/Time:  Tuesday January 18 2021 21:37:50 EST Ventricular Rate:  54 PR Interval:    QRS Duration: 96 QT Interval:  533 QTC Calculation: 506 R Axis:   56 Text Interpretation: Sinus rhythm Borderline ST depression, lateral leads Prolonged QT interval Confirmed by Nanda Quinton [phone]) on 01/18/2021 10:36:24  PM   Radiology CT Abdomen Pelvis W Contrast  Result Date: 01/18/2021 CLINICAL DATA:  Acute abdominal pain EXAM: CT ABDOMEN AND PELVIS WITH CONTRAST TECHNIQUE: Multidetector CT imaging of the abdomen and pelvis was performed using the standard protocol following bolus administration of intravenous contrast. CONTRAST:  170mL OMNIPAQUE IOHEXOL 300 MG/ML  SOLN COMPARISON:  None. FINDINGS: Lower chest: The visualized heart size within normal limits. No pericardial fluid/thickening. No hiatal hernia. Prominent pulmonary vasculature seen at both lung bases. Hepatobiliary: There is diffuse low density seen throughout the liver parenchyma with hepatomegaly. A coarse calcifications seen within the right liver lobe. Main portal vein is patent. No evidence of calcified gallstones, gallbladder wall thickening or biliary dilatation. Pancreas: Unremarkable. No pancreatic ductal dilatation or surrounding inflammatory changes. Spleen: Normal in size without focal abnormality. Adrenals/Urinary Tract: Heterogeneous low-density 2 cm lesion seen within the left adrenal gland. The kidneys and collecting system appear normal without evidence of urinary tract calculus or hydronephrosis. The bladder is mildly dilated and fluid-filled. Stomach/Bowel: The stomach, small bowel, and colon are normal in appearance. No inflammatory changes, wall thickening, or obstructive findings.Scattered colonic diverticula are seen. The appendix is unremarkable. Vascular/Lymphatic: There are no enlarged mesenteric, retroperitoneal, or pelvic lymph nodes. Scattered aortic atherosclerotic calcifications are seen without aneurysmal dilatation. Reproductive: The patient is status post hysterectomy. No adnexal masses or collections seen. Other: No evidence of abdominal wall mass or hernia. There is a coarse calcifications seen within the left deep pelvis. Musculoskeletal: No acute or significant osseous findings. IMPRESSION: Findings suggestive of pulmonary  vascular congestion at the lung bases. Hepatic steatosis and mild hepatomegaly Diverticulosis without diverticulitis Aortic Atherosclerosis (ICD10-I70.0). Electronically Signed   By: Prudencio Pair M.D.   On: 01/18/2021 23:48   DG Chest Port 1 View  Result Date: 01/18/2021 CLINICAL DATA:  Lightheadedness and dizziness for 1 week EXAM: PORTABLE CHEST 1 VIEW COMPARISON:  09/21/2020 FINDINGS: Single frontal view of the chest demonstrates stable enlargement of the cardiac silhouette. There  is chronic central vascular congestion without airspace disease, effusion, or pneumothorax. Stable degenerative changes of the shoulders. IMPRESSION: 1. Chronic central vascular congestion.  No acute airspace disease. Electronically Signed   By: Randa Ngo M.D.   On: 01/18/2021 22:30    Procedures Procedures   Medications Ordered in ED Medications  potassium chloride SA (KLOR-CON) CR tablet 20 mEq (has no administration in time range)  sodium chloride 0.9 % bolus 1,000 mL (1,000 mLs Intravenous New Bag/Given 01/18/21 2246)  iohexol (OMNIPAQUE) 300 MG/ML solution 100 mL (100 mLs Intravenous Contrast Given 01/18/21 2331)    ED Course  I have reviewed the triage vital signs and the nursing notes.  Pertinent labs & imaging results that were available during my care of the patient were reviewed by me and considered in my medical decision making (see chart for details).    MDM Rules/Calculators/A&P                         87 year old female presents with concern for lightheadedness and weakness x1 week as well as progressively worsening generalized abdominal pain.  Seen at walk-in clinic today and sent to the emergency department for treatment of dehydration.  Hypertensive on intake, vital signs otherwise normal.  Cardiopulmonary exam is normal, abdominal exam with generalized tenderness to palpation, worse in the left lower quadrant.  Patient is neurologically intact neurovascular intact in all 4 extremities. Will  proceed with laboratory studies, chest x-ray, EKG and CT of the abdomen and pelvis.  Patient declining analgesia at this time  CBC with anemia with hemoglobin 11.8, previously 12.6.  CMP with mild hypokalemia potassium 3.3 otherwise unremarkable.  Lipase normal, lactic acid negative, UA unremarkable. Chest x-ray revealed chronic central vascular congestion without acute airspace disease.  EKG sinus rhythm, prolonged QT, no STEMI. Troponin negative, 6.   CT scan negative for acute abdominal process.   While exact etiology for this patient's symptoms remains unclear, there does not appear to be any emergent cause for her symptoms.  Given reassuring vital signs, laboratory, and imaging studies, no further work-up is warranted in the ED at this time.  Recommend she follow-up closely with her primary care doctor.  Dennise voiced understanding her medical evaluation and treatment plan.  Each of her questions was answered to her expressed satisfaction.  Return precautions were given.  Patient is stable and appropriate for discharge at this time.  This chart was dictated using voice recognition software, Dragon. Despite the best efforts of this provider to proofread and correct errors, errors may still occur which can change documentation meaning.  Final Clinical Impression(s) / ED Diagnoses Final diagnoses:  Generalized abdominal pain  Prolonged Q-T interval on ECG    Rx / DC Orders ED Discharge Orders    None       Aura Dials 01/19/21 Vicki Mallet, MD 01/27/21 1013

## 2021-01-18 NOTE — ED Triage Notes (Signed)
Pt c/o light headedness and dizziness x 1 week. Pt states that the dizziness is been going on even when she is sitting still. Pt ambulatory to triage. pt sent by PCP from [facility] clinic for dehydration. Pt denies any dizziness at this time. Pt aaox3, ambulatory with steady gait, VSS, GCS 15, NAD noted in triage.

## 2021-01-19 NOTE — Discharge Instructions (Addendum)
You  were seen in the ER for your abdominal pain and lightheadedness. Your physical exam and vital signs were reassuring.  Your blood work, EKG, chest x-ray, and CT scan were also very reassuring.  While the exact cause of your symptoms remains unclear, there does not appear to be any emergent source of what you are experiencing.  Recommend you follow-up closely with your primary care doctor to make sure you are increasing your hydration and food intake at home.    Return to the emergency department if you develop any worsening abdominal pain, nausea or vomiting that does not stop, chest pain, shortness of breath, palpitations, if you pass out, or if develop any other serious symptoms.

## 2021-01-20 LAB — URINE CULTURE

## 2021-01-27 DIAGNOSIS — D649 Anemia, unspecified: Secondary | ICD-10-CM | POA: Diagnosis not present

## 2021-01-27 DIAGNOSIS — R0902 Hypoxemia: Secondary | ICD-10-CM | POA: Diagnosis not present

## 2021-01-27 DIAGNOSIS — R42 Dizziness and giddiness: Secondary | ICD-10-CM | POA: Diagnosis not present

## 2021-01-27 DIAGNOSIS — L819 Disorder of pigmentation, unspecified: Secondary | ICD-10-CM | POA: Diagnosis not present

## 2021-01-27 DIAGNOSIS — R531 Weakness: Secondary | ICD-10-CM | POA: Diagnosis not present

## 2021-01-27 DIAGNOSIS — R0602 Shortness of breath: Secondary | ICD-10-CM | POA: Diagnosis not present

## 2021-01-27 DIAGNOSIS — R0989 Other specified symptoms and signs involving the circulatory and respiratory systems: Secondary | ICD-10-CM | POA: Diagnosis not present

## 2021-01-27 DIAGNOSIS — I951 Orthostatic hypotension: Secondary | ICD-10-CM | POA: Diagnosis not present

## 2021-01-31 ENCOUNTER — Telehealth: Payer: Self-pay

## 2021-01-31 NOTE — Telephone Encounter (Signed)
NOTES ON FILE FROM [facility] [phone], REFERRAL IN PROFICIENT.Marland Kitchen

## 2021-02-11 DIAGNOSIS — D649 Anemia, unspecified: Secondary | ICD-10-CM | POA: Diagnosis not present

## 2021-02-16 DIAGNOSIS — D649 Anemia, unspecified: Secondary | ICD-10-CM | POA: Diagnosis not present

## 2021-02-16 DIAGNOSIS — I10 Essential (primary) hypertension: Secondary | ICD-10-CM | POA: Diagnosis not present

## 2021-02-16 DIAGNOSIS — J432 Centrilobular emphysema: Secondary | ICD-10-CM | POA: Diagnosis not present

## 2021-02-16 DIAGNOSIS — R54 Age-related physical debility: Secondary | ICD-10-CM | POA: Diagnosis not present

## 2021-02-16 DIAGNOSIS — D509 Iron deficiency anemia, unspecified: Secondary | ICD-10-CM | POA: Diagnosis not present

## 2021-02-18 ENCOUNTER — Other Ambulatory Visit: Payer: Self-pay | Admitting: Internal Medicine

## 2021-02-18 DIAGNOSIS — J432 Centrilobular emphysema: Secondary | ICD-10-CM

## 2021-02-23 ENCOUNTER — Other Ambulatory Visit: Payer: Self-pay | Admitting: Adult Health

## 2021-02-23 DIAGNOSIS — R413 Other amnesia: Secondary | ICD-10-CM

## 2021-02-24 ENCOUNTER — Encounter: Payer: Self-pay | Admitting: Adult Health

## 2021-02-24 ENCOUNTER — Ambulatory Visit: Payer: Medicare Other | Admitting: Adult Health

## 2021-02-24 DIAGNOSIS — R0602 Shortness of breath: Secondary | ICD-10-CM | POA: Diagnosis not present

## 2021-02-24 DIAGNOSIS — Z8719 Personal history of other diseases of the digestive system: Secondary | ICD-10-CM | POA: Diagnosis not present

## 2021-02-24 DIAGNOSIS — I10 Essential (primary) hypertension: Secondary | ICD-10-CM | POA: Diagnosis not present

## 2021-02-24 DIAGNOSIS — J432 Centrilobular emphysema: Secondary | ICD-10-CM | POA: Diagnosis not present

## 2021-02-24 DIAGNOSIS — D509 Iron deficiency anemia, unspecified: Secondary | ICD-10-CM | POA: Diagnosis not present

## 2021-02-24 DIAGNOSIS — E538 Deficiency of other specified B group vitamins: Secondary | ICD-10-CM | POA: Diagnosis not present

## 2021-02-24 NOTE — Progress Notes (Signed)
Cardiology Office Note   Date:  02/25/2021   ID:  Amanda [name], DOB 19-Sep-1933, MRN [id number]  PCP:  Caren Macadam, MD  Cardiologist:   No primary care provider on file. Referring:  Caren Macadam, MD  Chief Complaint  Patient presents with  . Shortness of Breath     History of Present Illness: Amanda [name] is a 87 y.o. female who is referred by Caren Macadam, MD for evaluation of shortness of breath.    She saw Dr. Marlou Porch in 2016 for chest pain EF in 2016 was 50 - 55%.  She had PVCs but did not need further treatment or testing.    I saw her recently and she had increased SOB.  She had a negative Lexiscan Myoview.   She had a normal EF and moderate TR on echo.   She had only mild restriction on PFTs.   She continues to get some shortness of breath with activities such as doing household chores.  She is not describing PND or orthopnea.  She is not having any new palpitations, presyncope or syncope.  She is anemic and she is going to be referred to GI.  She has been started on iron.  Of note she has had some low blood pressures and has been taken off of her hydrochlorothiazide as well as her Cozaar.   Past Medical History:  Diagnosis Date  . Depression   . Hyperlipidemia   . Hypertension   . Memory disorder   . Osteoporosis   . PVC (premature ventricular contraction)     Past Surgical History:  Procedure Laterality Date  . PARTIAL HYSTERECTOMY       Current Outpatient Medications  Medication Sig Dispense Refill  . albuterol (VENTOLIN HFA) 108 (90 Base) MCG/ACT inhaler INHALE TWO PUFFS BY MOUTH INTO LUNGS every SIX hours AS NEEDED 8.5 g 5  . amLODipine (NORVASC) 10 MG tablet Take 10 mg by mouth daily.    . citalopram (CELEXA) 20 MG tablet Take 20 mg by mouth daily.    Marland Kitchen donepezil (ARICEPT) 10 MG tablet Take 10 mg by mouth at bedtime.    . ferrous sulfate 325 (65 FE) MG tablet 1 tablet    . memantine (NAMENDA) 5 MG tablet Take 5 mg by mouth 2 (two) times daily.     . Methylcobalamin (B12-ACTIVE) 1 MG CHEW 1 tablet    . pantoprazole (PROTONIX) 40 MG tablet Take 1 tablet by mouth every morning.    . vitamin B-12 (CYANOCOBALAMIN) 1000 MCG tablet Take 1,000 mcg by mouth daily.      No current facility-administered medications for this visit.    Allergies:   Patient has no known allergies.    ROS:  Please see the history of present illness.   Otherwise, review of systems are positive for none.   All other systems are reviewed and negative.    PHYSICAL EXAM: VS:  BP 132/80   Pulse 67   Ht 5\' 1"  (1.549 m)   Wt 114 lb 12.8 oz (52.1 kg)   SpO2 91%   BMI 21.69 kg/m  , BMI  GENERAL:  Well appearing NECK:  No jugular venous distention, waveform within normal limits, carotid upstroke brisk and symmetric, no bruits, no thyromegaly LUNGS:  Clear to auscultation bilaterally CHEST:  Unremarkable HEART:  PMI not displaced or sustained,S1 and S2 within normal limits, no S3, no S4, no clicks, no rubs, no murmurs ABD:  Flat, positive bowel sounds normal in frequency in pitch,  no bruits, no rebound, no guarding, no midline pulsatile mass, no hepatomegaly, no splenomegaly EXT:  2 plus pulses throughout, no edema, no cyanosis no clubbing   EKG:  EKG is not ordered today.   Recent Labs: 01/18/2021: ALT 14; BUN 13; Creatinine, Ser 0.74; Hemoglobin 11.8; Platelets 340; Potassium 3.3; Sodium 137    Lipid Panel    Component Value Date/Time   CHOL 167 01/06/2015 0637   TRIG 53 01/06/2015 0637   HDL 40 01/06/2015 0637   CHOLHDL 4.2 01/06/2015 0637   VLDL 11 01/06/2015 0637   LDLCALC 116 (H) 01/06/2015 0637      Wt Readings from Last 3 Encounters:  02/25/21 114 lb 12.8 oz (52.1 kg)  01/18/21 116 lb (52.6 kg)  12/17/20 116 lb (52.6 kg)      Other studies Reviewed: Additional studies/ records that were reviewed today include: None. Review of the above records demonstrates:  NA  ASSESSMENT AND PLAN:  DOE:   The etiology of her dyspnea is not clear.    She is going to see pulmonary.  I do not see any further cardiac work-up needs to be done.   DYSLIPIDEMIA:    LDL is elevated at 130.   I will defer management to Caren Macadam, MD.  She would like to avoid any polypharmacy.  Current medicines are reviewed at length with the patient today.  The patient does not have concerns regarding medicines.  The following changes have been made:  None  Labs/ tests ordered today include:  None  No orders of the defined types were placed in this encounter.    Disposition:   FU with me as needed     Signed, Minus Breeding, MD  02/25/2021 11:10 AM    [place]

## 2021-02-25 ENCOUNTER — Encounter: Payer: Self-pay | Admitting: Podiatry

## 2021-02-25 ENCOUNTER — Ambulatory Visit: Payer: Medicare Other | Admitting: Cardiology

## 2021-02-25 ENCOUNTER — Ambulatory Visit: Payer: Medicare Other | Admitting: Podiatry

## 2021-02-25 ENCOUNTER — Encounter: Payer: Self-pay | Admitting: Cardiology

## 2021-02-25 ENCOUNTER — Other Ambulatory Visit: Payer: Self-pay

## 2021-02-25 VITALS — BP 132/80 | HR 67 | Ht 61.0 in | Wt 114.8 lb

## 2021-02-25 DIAGNOSIS — E785 Hyperlipidemia, unspecified: Secondary | ICD-10-CM

## 2021-02-25 DIAGNOSIS — B351 Tinea unguium: Secondary | ICD-10-CM

## 2021-02-25 DIAGNOSIS — M19019 Primary osteoarthritis, unspecified shoulder: Secondary | ICD-10-CM | POA: Insufficient documentation

## 2021-02-25 DIAGNOSIS — R42 Dizziness and giddiness: Secondary | ICD-10-CM | POA: Insufficient documentation

## 2021-02-25 DIAGNOSIS — M79671 Pain in right foot: Secondary | ICD-10-CM

## 2021-02-25 DIAGNOSIS — D509 Iron deficiency anemia, unspecified: Secondary | ICD-10-CM | POA: Insufficient documentation

## 2021-02-25 DIAGNOSIS — R269 Unspecified abnormalities of gait and mobility: Secondary | ICD-10-CM | POA: Insufficient documentation

## 2021-02-25 DIAGNOSIS — R54 Age-related physical debility: Secondary | ICD-10-CM | POA: Insufficient documentation

## 2021-02-25 DIAGNOSIS — E440 Moderate protein-calorie malnutrition: Secondary | ICD-10-CM | POA: Insufficient documentation

## 2021-02-25 DIAGNOSIS — M79672 Pain in left foot: Secondary | ICD-10-CM

## 2021-02-25 DIAGNOSIS — F320 Major depressive disorder, single episode, mild: Secondary | ICD-10-CM | POA: Insufficient documentation

## 2021-02-25 DIAGNOSIS — D649 Anemia, unspecified: Secondary | ICD-10-CM | POA: Insufficient documentation

## 2021-02-25 DIAGNOSIS — R630 Anorexia: Secondary | ICD-10-CM | POA: Insufficient documentation

## 2021-02-25 DIAGNOSIS — Q828 Other specified congenital malformations of skin: Secondary | ICD-10-CM

## 2021-02-25 DIAGNOSIS — M79675 Pain in left toe(s): Secondary | ICD-10-CM | POA: Diagnosis not present

## 2021-02-25 DIAGNOSIS — R0600 Dyspnea, unspecified: Secondary | ICD-10-CM

## 2021-02-25 DIAGNOSIS — F0390 Unspecified dementia without behavioral disturbance: Secondary | ICD-10-CM | POA: Insufficient documentation

## 2021-02-25 DIAGNOSIS — J432 Centrilobular emphysema: Secondary | ICD-10-CM | POA: Insufficient documentation

## 2021-02-25 DIAGNOSIS — R0609 Other forms of dyspnea: Secondary | ICD-10-CM

## 2021-02-25 DIAGNOSIS — G8929 Other chronic pain: Secondary | ICD-10-CM | POA: Insufficient documentation

## 2021-02-25 DIAGNOSIS — I700 Atherosclerosis of aorta: Secondary | ICD-10-CM | POA: Insufficient documentation

## 2021-02-25 DIAGNOSIS — M79674 Pain in right toe(s): Secondary | ICD-10-CM | POA: Diagnosis not present

## 2021-02-25 NOTE — Patient Instructions (Signed)
Medication Instructions:  Continue current medications  *If you need a refill on your cardiac medications before your next appointment, please call your pharmacy*   Lab Work: None Ordered  Testing/Procedures: None ordered   Follow-Up: At [facility], you and your health needs are our priority.  As part of our continuing mission to provide you with exceptional heart care, we have created designated Provider Care Teams.  These Care Teams include your primary Cardiologist (physician) and Advanced Practice Providers (APPs -  Physician Assistants and Nurse Practitioners) who all work together to provide you with the care you need, when you need it.  We recommend signing up for the patient portal called "MyChart".  Sign up information is provided on this After Visit Summary.  MyChart is used to connect with patients for Virtual Visits (Telemedicine).  Patients are able to view lab/test results, encounter notes, upcoming appointments, etc.  Non-urgent messages can be sent to your provider as well.   To learn more about what you can do with MyChart, go to [email or web address].    Your next appointment:   As Needed

## 2021-02-25 NOTE — Progress Notes (Signed)
Subjective:  Patient ID: Amanda [name], female    DOB: 04-Feb-1933,  MRN: [id number]  87 y.o. female presents painful thick toenails that are difficult to trim. Pain interferes with ambulation. Aggravating factors include wearing enclosed shoe gear. Pain is relieved with periodic professional debridement.   Her daughter is present during today's visit.  PCP is Dr. Caren Macadam. Last visit was 02/24/2021.  Current Outpatient Medications:  .  ferrous sulfate 325 (65 FE) MG tablet, 1 tablet, Disp: , Rfl:  .  albuterol (VENTOLIN HFA) 108 (90 Base) MCG/ACT inhaler, INHALE TWO PUFFS BY MOUTH INTO LUNGS every SIX hours AS NEEDED, Disp: 8.5 g, Rfl: 5 .  amLODipine (NORVASC) 10 MG tablet, 1 tablet, Disp: , Rfl:  .  amlodipine-atorvastatin (CADUET) 10-10 MG tablet, Take 1 tablet by mouth daily., Disp: , Rfl:  .  citalopram (CELEXA) 20 MG tablet, 1 tablet, Disp: , Rfl:  .  donepezil (ARICEPT) 10 MG tablet, Take 10 mg by mouth at bedtime., Disp: , Rfl:  .  hydrochlorothiazide (HYDRODIURIL) 25 MG tablet, Take 1 tablet by mouth every morning., Disp: , Rfl:  .  losartan (COZAAR) 50 MG tablet, Take 50 mg by mouth daily. , Disp: , Rfl:  .  meclizine (ANTIVERT) 25 MG tablet, Take 1 tablet (25 mg total) by mouth 3 (three) times daily as needed for dizziness., Disp: 30 tablet, Rfl: 0 .  memantine (NAMENDA) 5 MG tablet, 1 tablet, Disp: , Rfl:  .  Methylcobalamin (B12-ACTIVE) 1 MG CHEW, 1 tablet, Disp: , Rfl:  .  mirtazapine (REMERON SOL-TAB) 15 MG disintegrating tablet, 1 tablet at bedtime., Disp: , Rfl:  .  pantoprazole (PROTONIX) 40 MG tablet, Take 1 tablet by mouth every morning., Disp: , Rfl:  .  rivaroxaban (XARELTO) 20 MG TABS tablet, Take 20 mg by mouth daily with supper., Disp: , Rfl:  .  vitamin B-12 (CYANOCOBALAMIN) 1000 MCG tablet, Take 1,000 mcg by mouth daily. , Disp: , Rfl:   No Known Allergies  Review of Systems: Negative except as noted in the HPI.  Objective:   Constitutional Pt is a  pleasant 87 y.o. African American female WD, WN in NAD. AAO x 3.   Vascular Capillary refill time to digits immediate b/l. Palpable pedal pulses b/l LE. Pedal hair sparse. Lower extremity skin temperature gradient within normal limits.  Neurologic Protective sensation intact 5/5 intact bilaterally with 10g monofilament b/l. Vibratory sensation intact b/l.  Dermatologic Pedal skin with normal turgor, texture and tone bilaterally. No open wounds bilaterally. No interdigital macerations bilaterally. Toenails 1-5 b/l elongated, discolored, dystrophic, thickened, crumbly with subungual debris and tenderness to dorsal palpation. Porokeratotic lesion(s) submet head 1 right foot, left foot and plantar aspect of heel right foot. No erythema, no edema, no drainage, no fluctuance.  Orthopedic: Normal muscle strength 5/5 to all lower extremity muscle groups bilaterally. No pain crepitus or joint limitation noted with ROM b/l. No gross bony deformities bilaterally.   Radiographs: None Assessment:   1. Pain due to onychomycosis of toenails of both feet   2. Porokeratosis   3. Pain in both feet    Plan:  Patient was evaluated and treated and all questions answered.  Onychomycosis with pain -Nails palliatively debridement as below. -Educated on self-care  Procedure: Nail Debridement Rationale: Pain Type of Debridement: manual, sharp debridement. Instrumentation: Nail nipper, rotary burr. Number of Nails: 10  -Examined patient. -Patient to continue soft, supportive shoe gear daily. -Toenails 1-5 b/l were debrided in length and girth with sterile  nail nippers and dremel without iatrogenic bleeding.  -Porokeratosis b/l feet pared and enucleated with sterile scalpel blade without incident. Total number of lesions =3. -Patient to report any pedal injuries to medical professional immediately. -Patient/POA to call should there be question/concern in the interim.  Return in about 3 months (around  05/27/2021).  Marzetta Board, DPM

## 2021-03-01 DIAGNOSIS — I10 Essential (primary) hypertension: Secondary | ICD-10-CM | POA: Diagnosis not present

## 2021-03-01 DIAGNOSIS — R54 Age-related physical debility: Secondary | ICD-10-CM | POA: Diagnosis not present

## 2021-03-01 DIAGNOSIS — D509 Iron deficiency anemia, unspecified: Secondary | ICD-10-CM | POA: Diagnosis not present

## 2021-03-01 DIAGNOSIS — G8929 Other chronic pain: Secondary | ICD-10-CM | POA: Diagnosis not present

## 2021-03-01 DIAGNOSIS — D649 Anemia, unspecified: Secondary | ICD-10-CM | POA: Diagnosis not present

## 2021-03-01 DIAGNOSIS — J432 Centrilobular emphysema: Secondary | ICD-10-CM | POA: Diagnosis not present

## 2021-03-21 DIAGNOSIS — R296 Repeated falls: Secondary | ICD-10-CM | POA: Insufficient documentation

## 2021-03-21 DIAGNOSIS — F3340 Major depressive disorder, recurrent, in remission, unspecified: Secondary | ICD-10-CM | POA: Insufficient documentation

## 2021-03-21 DIAGNOSIS — B070 Plantar wart: Secondary | ICD-10-CM | POA: Insufficient documentation

## 2021-03-21 DIAGNOSIS — E538 Deficiency of other specified B group vitamins: Secondary | ICD-10-CM | POA: Insufficient documentation

## 2021-03-21 DIAGNOSIS — Z860100 Personal history of colon polyps, unspecified: Secondary | ICD-10-CM | POA: Insufficient documentation

## 2021-03-21 DIAGNOSIS — R32 Unspecified urinary incontinence: Secondary | ICD-10-CM | POA: Insufficient documentation

## 2021-03-23 ENCOUNTER — Other Ambulatory Visit: Payer: Self-pay | Admitting: Physician Assistant

## 2021-03-23 ENCOUNTER — Ambulatory Visit: Payer: Medicare Other | Admitting: Internal Medicine

## 2021-03-23 ENCOUNTER — Encounter: Payer: Self-pay | Admitting: Internal Medicine

## 2021-03-23 ENCOUNTER — Other Ambulatory Visit: Payer: Self-pay

## 2021-03-23 VITALS — BP 142/78 | HR 67 | Ht 61.0 in | Wt 117.0 lb

## 2021-03-23 DIAGNOSIS — R1013 Epigastric pain: Secondary | ICD-10-CM

## 2021-03-23 DIAGNOSIS — Z86010 Personal history of colonic polyps: Secondary | ICD-10-CM | POA: Diagnosis not present

## 2021-03-23 DIAGNOSIS — J432 Centrilobular emphysema: Secondary | ICD-10-CM | POA: Diagnosis not present

## 2021-03-23 DIAGNOSIS — I739 Peripheral vascular disease, unspecified: Secondary | ICD-10-CM | POA: Diagnosis not present

## 2021-03-23 DIAGNOSIS — D509 Iron deficiency anemia, unspecified: Secondary | ICD-10-CM | POA: Diagnosis not present

## 2021-03-23 MED ORDER — ALBUTEROL SULFATE (2.5 MG/3ML) 0.083% IN NEBU: 2.5000 mg | INHALATION_SOLUTION | Freq: Four times a day (QID) | RESPIRATORY_TRACT | 5 refills | Status: DC | PRN

## 2021-03-23 NOTE — Progress Notes (Signed)
Amanda [name]    [id number]    12/29/1932  Primary Care Physician:Hagler, Apolonio Schneiders, MD Date of Appointment: 03/23/2021 Established Patient Visit  Chief complaint:   Chief Complaint  Patient presents with  . Emphysema    Frequent urination    HPI: Amanda [name] is a 87 y.o. woman with shortness of breath and history of tobacco use with centrilobular emphysema.  Interval Updates: No interval hospitalizations. Had an ED visit for dehydration, possible GI bleed.  Continues to live at home. She doesn't take albuterol inhaler because she isn't comfortable taking it.  Independent with ADLs. Does get sob and has wheezing especially towards the end of the day. Usually with exertion.  No coughing or sputum production.   Have some stress incontinence.   I have reviewed the patient's family social and past medical history and updated as appropriate.   Past Medical History:  Diagnosis Date  . Depression   . Hyperlipidemia   . Hypertension   . Memory disorder   . Osteoporosis   . PVC (premature ventricular contraction)     Past Surgical History:  Procedure Laterality Date  . PARTIAL HYSTERECTOMY      Family History  Problem Relation Age of Onset  . Lung cancer Neg Hx     Social History   Occupational History  . Not on file  Tobacco Use  . Smoking status: Former Smoker    Years: 17.00    Types: Cigarettes    Start date: 10/29/1954    Quit date: 10/30/1971    Years since quitting: 49.4  . Smokeless tobacco: Never Used  . Tobacco comment: smoked 3-4/day  Substance and Sexual Activity  . Alcohol use: No  . Drug use: No  . Sexual activity: Not on file     Physical Exam: Blood pressure (!) 142/78, pulse 67, height 5\' 1"  (1.549 m), weight 117 lb (53.1 kg), SpO2 93 %.  Gen:     NAD, kyphoscoliosis Lungs:    CTAB, no wheezes or crackles CV:         RRR, no mrg   Data Reviewed: Imaging: I have personally reviewed the CT Chest Nov 2021 with centrilobular  emphysema  PFTs: PFT Results Latest Ref Rng & Units 12/17/2020  FVC-Pre L 1.40  FVC-Predicted Pre % 93  FVC-Post L 1.43  FVC-Predicted Post % 95  Pre FEV1/FVC % % 84  Post FEV1/FCV % % 83  FEV1-Pre L 1.18  FEV1-Predicted Pre % 102  FEV1-Post L 1.19  DLCO uncorrected ml/min/mmHg 10.99  DLCO UNC% % 65  DLCO corrected ml/min/mmHg 10.99  DLCO COR %Predicted % 65  DLVA Predicted % 105  TLC L 3.50  TLC % Predicted % 75  RV % Predicted % 80   I have personally reviewed the patient's PFTs and they show no airflow limitation without a BD response.   Echocardiogram Left Ventricle: Left ventricular ejection fraction, by estimation, is 50  to 55%. The left ventricle has low normal function. The left ventricle has  no regional wall motion abnormalities. The left ventricular internal  cavity size was normal in size.  There is no left ventricular hypertrophy. Left ventricular diastolic  parameters are consistent with Grade I diastolic dysfunction (impaired  relaxation).   Right Ventricle: The right ventricular size is normal. No increase in  right ventricular wall thickness. Right ventricular systolic function is  normal. There is normal pulmonary artery systolic pressure. The tricuspid  regurgitant velocity is 2.75  m/s, and  with an assumed right atrial pressure of 3 mmHg, the estimated right  ventricular systolic pressure is 28.3 mmHg.   Left Atrium: Left atrial size was normal in size.   Right Atrium: Right atrial size was normal in size.   Pericardium: There is no evidence of pericardial effusion.   Mitral Valve: The mitral valve is normal in structure. No evidence of  mitral valve regurgitation. No evidence of mitral valve stenosis.   Tricuspid Valve: The tricuspid valve is normal in structure. Tricuspid  valve regurgitation is moderate . No evidence of tricuspid stenosis.   Aortic Valve: The aortic valve is normal in structure. Aortic valve  regurgitation is mild. Aortic  regurgitation PHT measures 1063 msec. Mild  aortic valve sclerosis is present, with no evidence of aortic valve  stenosis.   Pulmonic Valve: The pulmonic valve was normal in structure. Pulmonic valve  regurgitation is not visualized. No evidence of pulmonic stenosis.   Aorta: The aortic root is normal in size and structure.   Venous: The inferior vena cava is normal in size with greater than 50%  respiratory variability, suggesting right atrial pressure of 3 mmHg.   IAS/Shunts: No atrial level shunt detected by color flow Doppler.   Labs: Lab Results  Component Value Date   WBC 6.2 01/18/2021   HGB 11.8 (L) 01/18/2021   HCT 36.8 01/18/2021   MCV 85.6 01/18/2021   PLT 340 01/18/2021   Lab Results  Component Value Date   NA 137 01/18/2021   K 3.3 (L) 01/18/2021   CL 95 (L) 01/18/2021   CO2 32 01/18/2021     Immunization status: Immunization History  Administered Date(s) Administered  . Influenza, High Dose Seasonal PF 09/03/2015, 08/22/2016, 10/16/2017, 11/26/2018, 08/01/2019, 09/10/2020  . Influenza,inj,Quad PF,6+ Mos 01/06/2015  . PFIZER(Purple Top)SARS-COV-2 Vaccination 12/17/2019, 01/07/2020, 09/13/2020  . Pneumococcal Conjugate-13 10/01/2015, 08/22/2016  . Pneumococcal Polysaccharide-23 11/26/2018  . Tdap 10/25/2011  . Zoster 12/03/2020, 02/24/2021    Assessment:  Centrilobular emphysema Enlarged pulmonary artery,  Chronic HFrEF, no pulmonary hypertension noted on echocardiogram  Plan/Recommendations:  Mild centrilobular emphysema - recommend prn albuterol nebulizer treatment since she has difficulty with inhaler coordination .   Return to Care: No follow-ups on file.   Lenice Llamas, MD Pulmonary and [place]

## 2021-03-23 NOTE — Patient Instructions (Addendum)
The patient should have follow up scheduled with myself in 6 months.   I will prescribe a nebulizer machine for you - will be delivered to your home.   Take the nebulizer treatments only as needed.

## 2021-04-04 ENCOUNTER — Ambulatory Visit
Admission: RE | Admit: 2021-04-04 | Discharge: 2021-04-04 | Disposition: A | Discharge status: Home / Self Care | Payer: Medicare Other | Source: Ambulatory Visit | Attending: Physician Assistant | Admitting: Physician Assistant

## 2021-04-04 DIAGNOSIS — D3502 Benign neoplasm of left adrenal gland: Secondary | ICD-10-CM | POA: Diagnosis not present

## 2021-04-04 DIAGNOSIS — R1013 Epigastric pain: Secondary | ICD-10-CM

## 2021-04-04 DIAGNOSIS — I700 Atherosclerosis of aorta: Secondary | ICD-10-CM | POA: Diagnosis not present

## 2021-04-04 DIAGNOSIS — E278 Other specified disorders of adrenal gland: Secondary | ICD-10-CM | POA: Diagnosis not present

## 2021-04-04 DIAGNOSIS — K5730 Diverticulosis of large intestine without perforation or abscess without bleeding: Secondary | ICD-10-CM | POA: Diagnosis not present

## 2021-04-04 MED ORDER — IOPAMIDOL (ISOVUE-300) INJECTION 61%
100.0000 mL | Freq: Once | INTRAVENOUS | Status: AC | PRN
  Administered 2021-04-04: 100 mL via INTRAVENOUS

## 2021-04-07 ENCOUNTER — Ambulatory Visit
Admission: RE | Admit: 2021-04-07 | Discharge: 2021-04-07 | Disposition: A | Discharge status: Home / Self Care | Payer: Medicare Other | Source: Ambulatory Visit | Attending: Physician Assistant | Admitting: Physician Assistant

## 2021-04-07 ENCOUNTER — Other Ambulatory Visit: Payer: Self-pay | Admitting: Physician Assistant

## 2021-04-07 DIAGNOSIS — R1013 Epigastric pain: Secondary | ICD-10-CM | POA: Diagnosis not present

## 2021-04-07 DIAGNOSIS — D509 Iron deficiency anemia, unspecified: Secondary | ICD-10-CM | POA: Diagnosis not present

## 2021-04-07 DIAGNOSIS — K5900 Constipation, unspecified: Secondary | ICD-10-CM

## 2021-04-07 DIAGNOSIS — R197 Diarrhea, unspecified: Secondary | ICD-10-CM | POA: Diagnosis not present

## 2021-04-14 ENCOUNTER — Telehealth: Payer: Self-pay | Admitting: Internal Medicine

## 2021-04-14 DIAGNOSIS — J432 Centrilobular emphysema: Secondary | ICD-10-CM | POA: Diagnosis not present

## 2021-04-14 NOTE — Telephone Encounter (Signed)
I called and spoke with patient daughter who is on [place]. Daughter stated the nebulizer order was sent in on 03/23/21 and was concerned as to why [employer] is just now sending it. I explained that the only thing we do is send in the order, it is up to the DME as to how long it will take to get there and to contact the patient. Also the daughter said [facility] told them that we will come to the show them how to use the machine.i informed the daughter that they can drop by the office and we can show them how to use it, just ask someone at the front desk to ask [place] to come out. Daughter verbalized understanding, nothing further needed

## 2021-04-19 ENCOUNTER — Telehealth: Payer: Self-pay | Admitting: Internal Medicine

## 2021-04-19 DIAGNOSIS — I10 Essential (primary) hypertension: Secondary | ICD-10-CM | POA: Diagnosis not present

## 2021-04-19 DIAGNOSIS — M19012 Primary osteoarthritis, left shoulder: Secondary | ICD-10-CM | POA: Diagnosis not present

## 2021-04-19 DIAGNOSIS — G8929 Other chronic pain: Secondary | ICD-10-CM | POA: Diagnosis not present

## 2021-04-19 DIAGNOSIS — D649 Anemia, unspecified: Secondary | ICD-10-CM | POA: Diagnosis not present

## 2021-04-19 DIAGNOSIS — J432 Centrilobular emphysema: Secondary | ICD-10-CM | POA: Diagnosis not present

## 2021-04-19 DIAGNOSIS — D509 Iron deficiency anemia, unspecified: Secondary | ICD-10-CM | POA: Diagnosis not present

## 2021-04-19 DIAGNOSIS — R54 Age-related physical debility: Secondary | ICD-10-CM | POA: Diagnosis not present

## 2021-04-19 DIAGNOSIS — M19011 Primary osteoarthritis, right shoulder: Secondary | ICD-10-CM | POA: Diagnosis not present

## 2021-04-19 NOTE — Telephone Encounter (Signed)
Spoke with patient and patient's daughter Gildardo Griffes in office today. Patient has received her nebulizer machine but did not know how to use it. I demonstrated to the patient and her daughter on how to setup the machine, change the filter and to administer the medication into the cup. They both verbalized understanding.   I made sure that the patient had the albuterol that was ordered on 03/23/21. Alfeda stated that she has not received the medication. I checked her chart and the medication was sent to [facility] on 03/23/21. She stated that they have not heard anything from the pharmacy in regards to the medication but will follow up with the pharmacy. She is aware to call us back if anything else is needed.   Nothing further needed at time of call.

## 2021-04-19 NOTE — Telephone Encounter (Signed)
Called and spoke with pt's daughter Alfreda about pt's nebulizer. Stated to her that DME is supposed to be the ones showing pt how to set up the nebulizer but she said that she hasn't heard anything from them about getting an appt scheduled.  Alfreda stated she called [facility] today about this and they told her that they saw nothing about any appt to get her to come in for them to show her how to use the nebulizer.  Went to speak with Ashley Akin, RN about this and she called Melissa from [place] while I was in the room. Per Lenna Sciara, she is going to call [facility] for them to call pt to schedule an appt to be shown how to use the nebulizer and Lauren also asked if the nebulizers that are given to pts are the same as any other nebulizer and Melissa stated that it should be.   Lauren said that we should be okay to show pt how to use the nebulizer (putting it together) but are not to have  Pt use the nebulizer in the office as pt has not been covid tested.  Stated this to [place] and also made her aware that [facility] should be calling them about an appt on how to be shown to use the nebulizer and doing the treatments. After this, pt still wanted to come in to be shown how to use the nebulizer. Nothing further needed.

## 2021-05-02 DIAGNOSIS — G8929 Other chronic pain: Secondary | ICD-10-CM | POA: Diagnosis not present

## 2021-05-02 DIAGNOSIS — I10 Essential (primary) hypertension: Secondary | ICD-10-CM | POA: Diagnosis not present

## 2021-05-02 DIAGNOSIS — J432 Centrilobular emphysema: Secondary | ICD-10-CM | POA: Diagnosis not present

## 2021-05-02 DIAGNOSIS — R54 Age-related physical debility: Secondary | ICD-10-CM | POA: Diagnosis not present

## 2021-05-02 DIAGNOSIS — D649 Anemia, unspecified: Secondary | ICD-10-CM | POA: Diagnosis not present

## 2021-05-02 DIAGNOSIS — D509 Iron deficiency anemia, unspecified: Secondary | ICD-10-CM | POA: Diagnosis not present

## 2021-05-03 ENCOUNTER — Ambulatory Visit: Payer: Medicare Other | Admitting: Adult Health

## 2021-05-15 DIAGNOSIS — J432 Centrilobular emphysema: Secondary | ICD-10-CM | POA: Diagnosis not present

## 2021-05-16 ENCOUNTER — Ambulatory Visit: Payer: Medicare Other | Admitting: Neurology

## 2021-05-16 VITALS — BP 106/72 | HR 82 | Ht 61.0 in | Wt 113.0 lb

## 2021-05-16 DIAGNOSIS — F0390 Unspecified dementia without behavioral disturbance: Secondary | ICD-10-CM | POA: Diagnosis not present

## 2021-05-16 MED ORDER — MEMANTINE HCL 10 MG PO TABS: 10.0000 mg | ORAL_TABLET | Freq: Two times a day (BID) | ORAL | 3 refills | Status: DC

## 2021-05-16 NOTE — Patient Instructions (Signed)
It was nice to see you again today.  Your memory scores are stable but I would like to see if we can increase your memantine to 10 mg twice daily to get you on a maintenance status as you are tolerating it well at 5 mg twice daily.  I do not believe you should be driving at this point.  I recommend that you do not drive any longer.  Please try to avoid using the stove at the house and try to reheat food in the microwave only.  You may be at risk of leaving something on the stove or in the oven.  Try to hydrate well with water.  6 to 8 cups of water per day are recommended, generally speaking, 8 ounce size each.  I have adjusted your prescription.  Please follow-up routinely to see Ward Givens, NP in 6 months.

## 2021-05-16 NOTE — Progress Notes (Signed)
Subjective:    Patient ID: Amanda [name] is a 87 y.o. female.  HPI    Interim history:   Amanda [name] is a very pleasant 87 year old right-handed woman with an underlying medical history of hypertension, migraines, osteoporosis, and depression, who presents for follow-up consultation of her memory loss.  The patient is accompanied by [place], her oldest daughter from [place], [place], today.  I first met her at the request of her primary care physician on 04/07/2019, at which time she reported memory loss for at least 2 years.  Her MMSE was 18 out of 30 at the time.  She had driven herself to the appointment.  I did not think she was fully safe to drive a car.  She had recent blood work and a brain MRI through her primary care physician.  Her brain MRI from February 2020 showed atrophy and small vessel disease as well as a left basal ganglia old lacunar stroke.  She was advised to start Aricept in low-dose at 5 mg strength once daily.  She had several interim appointments but also several no-shows.  She saw Ward Givens, NP on 08/05/2019, at which time her MMSE was 24.  She was advised to continue with Aricept 5 mg strength once daily.  She no showed for an appointment with Vaughan Browner on 02/02/2020 as well as 02/19/2020.  She saw Ward Givens, NP, on 02/23/2020, at which time her MMSE was 22.  She was advised to increase her Aricept to 10 mg once daily.  Of note, she had no showed for an appointment with Dr. Sima Matas in the interim on 08/15/2019.   She saw Ward Givens, NP, on 08/25/2020, at which time her MMSE was 18 out of 30.  She was advised to continue with Aricept and adding Namenda was discussed at the time but she wanted to check with her primary care and cardiologist first.  She was subsequently started on generic Namenda 5 mg bid in October 2021.  She missed an appointment with Ward Givens, NP on 02/24/21.   Today, 05/16/21: She reports doing fairly well, no recent falls, denies any loss  of appetite, weight has been fairly stable within 2 to 3 pounds range.  Her granddaughters check on her.  She has 4 granddaughters, these are Blondine's daughters.  Patient's other daughter is in [place] and visits frequently.  Her daughter in [place] does not have any kids.  Patient drives to her grocery store or post office, she does cook some food on her stove.  She lives alone.  Daughter reports that she was summoned for jury duty for this week but they are going to address this with the court house today.  She is tolerating the memantine 5 mg twice daily.  She has had intermittent dizziness but denies any significant lightheadedness.  Symptoms come and go, she denies any specific symptoms such as spinning sensation.  Sometimes she wonders if it is her nerves and she gets anxious at times.  The patient's allergies, current medications, family history, past medical history, past social history, past surgical history and problem list were reviewed and updated as appropriate.   Previously:   04/07/19: (She) reports memory problems for the past few years perhaps, probably over 2 years, per her report "a pretty good while". I reviewed the office note from 12/27/2018.  She was placed on antidepressant medication at the time as she was feeling depressed and had stress.  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  She reports  that she has had issues with her memory perhaps for the past several years.  She has high school education.  She has not had any confusion, she drives a car.  She is mostly forgetful.   She reports that she has gotten lost driving, she reports that when she goes to the store she often forgets why she went there.  She has had forgetfulness for names.  She lives alone, her son has been staying with her off and on.  She did not recall that she told you that in February he was living with her.  She states that he had lived with her for a week recently.  He is getting his own place in  [place].  She reports that she lives in a not good neighborhood and that there are drugs in the neighborhood.  She has 3 children altogether, all 3 of them were in the [employer].  2 of them in [place] but son is moving to [place].  She has 2 daughters, one in [place] and the other is currently in the [employer], in [place], [place].  She had difficulty remembering where her daughter was.  She also had difficulty relating her family history.  She denies any family history of memory loss and for stated that all her siblings were deceased and that they were 69 altogether with her but then reports that she had 1 sister and 2 brothers, she then went on to say that her youngest brother is still living and that she is the second to last amongst her siblings and age.  She also stated that she was the baby. She quit smoking many years ago, does not recall how long ago, she does not drink any alcohol, she reports that she drinks caffeine about 2 cups of coffee in the morning and 1 to 2 cans of soda per day.  She has had a fluctuation in her appetite, denies any significant weight loss recently.  She has been on Celexa and reports that her depression is a little better.  She has limited her driving.  She states that she only drives to church.  As far as getting her groceries, she reports that she goes to [employer].  She sleeps well she states.  She typically falls asleep in the den on the sofa.  She does not have a set bedtime routine or schedule.  She does turn the TV off before falling asleep.  She has no pets, she had a pitbull but reports sometime around last Christmas someone stole her dog. She denies any pain, denies any falls.  She drinks water. You ordered a brain MRI.  She had a brain MRI without contrast on 01/10/2019 and I reviewed the results: IMPRESSION: Atrophy and small vessel disease.  No acute intracranial findings. White matter changes were reported in the moderate range, she also had a left  basal ganglia old lacunar stroke.  She had blood work through your office including B12, TSH, RPR, and I reviewed the results, TSH and B12 were in the normal range.  Her Past Medical History Is Significant For: Past Medical History:  Diagnosis Date   Depression    Hyperlipidemia    Hypertension    Memory disorder    Osteoporosis    PVC (premature ventricular contraction)     Her Past Surgical History Is Significant For: Past Surgical History:  Procedure Laterality Date   PARTIAL HYSTERECTOMY      Her Family History Is Significant For: Family History  Problem Relation Age of Onset   Lung cancer Neg Hx     Her Social History Is Significant For: Social History   Socioeconomic History   Marital status: Divorced    Spouse name: Not on file   Number of children: Not on file   Years of education: Not on file   Highest education level: Not on file  Occupational History   Not on file  Tobacco Use   Smoking status: Former    Years: 17.00    Pack years: 0.00    Types: Cigarettes    Start date: 10/29/1954    Quit date: 10/30/1971    Years since quitting: 49.5   Smokeless tobacco: Never   Tobacco comments:    smoked 3-4/day  Substance and Sexual Activity   Alcohol use: No   Drug use: No   Sexual activity: Not on file  Other Topics Concern   Not on file  Social History Narrative   Worked in mills.   Divorced 10    Lives alone   Son lives in town and two daughter lives out of town.       Social Determinants of Health   Financial Resource Strain: Not on file  Food Insecurity: Not on file  Transportation Needs: Not on file  Physical Activity: Not on file  Stress: Not on file  Social Connections: Not on file    Her Allergies Are:  No Known Allergies:   Her Current Medications Are:  Outpatient Encounter Medications as of 05/16/2021  Medication Sig   albuterol (PROVENTIL) (2.5 MG/3ML) 0.083% nebulizer solution Take 3 mLs (2.5 mg total) by nebulization every 6  (six) hours as needed for wheezing or shortness of breath.   albuterol (VENTOLIN HFA) 108 (90 Base) MCG/ACT inhaler INHALE TWO PUFFS BY MOUTH INTO LUNGS every SIX hours AS NEEDED   amLODipine (NORVASC) 10 MG tablet Take 10 mg by mouth daily.   citalopram (CELEXA) 20 MG tablet Take 20 mg by mouth daily.   donepezil (ARICEPT) 10 MG tablet Take 10 mg by mouth at bedtime.   ferrous sulfate 325 (65 FE) MG tablet 1 tablet   memantine (NAMENDA) 5 MG tablet Take 5 mg by mouth 2 (two) times daily.   Methylcobalamin (B12-ACTIVE) 1 MG CHEW 1 tablet   pantoprazole (PROTONIX) 40 MG tablet Take 1 tablet by mouth every morning.   vitamin B-12 (CYANOCOBALAMIN) 1000 MCG tablet Take 1,000 mcg by mouth daily.    No facility-administered encounter medications on file as of 05/16/2021.  :  Review of Systems:  Out of a complete 14 point review of systems, all are reviewed and negative with the exception of these symptoms as listed below:   Review of Systems  Neurological:        Here for 6 month f/u. Reports overall she has been doing well. She has noticed an increase in dizziness but denies any falls.    Objective:  Neurological Exam  Physical Exam Physical Examination:   Vitals:   05/16/21 1250  BP: 106/72  Pulse: 82  SpO2: 92%    General Examination: The patient is a very pleasant 87 y.o. female in no acute distress. She appears well-developed and well-nourished and well groomed.   HEENT: Normocephalic, atraumatic, pupils are equal, round and reactive to light, extraocular tracking is well-preserved, hearing grossly intact, face is symmetric with normal facial animation.  Speech is clear without dysarthria.  Airway examination reveals full dentures, mild mouth dryness, tongue protrudes centrally and palate  elevates symmetrically.     Chest: Clear to auscultation without wheezing, rhonchi or crackles noted.   Heart: S1+S2+0, regular and normal without murmurs, rubs or gallops noted.   Abdomen:  Soft, non-tender and non-distended.   Extremities: There is no pitting edema in the distal lower extremities bilaterally.   Skin: Warm and dry without trophic changes noted.   Musculoskeletal: exam reveals no obvious joint deformities.   Neurologically: Mental status: The patient is awake, alert and oriented to self, situation, but lost 4 points on orientation on the MMSE.    There is no evidence of aphasia, agnosia, apraxia or anomia. Speech is clear with normal prosody and enunciation. Thought process is linear. Mood is normal and affect is normal.   On 04/07/2019: MMSE: 18/30, CDT: 0/4, AFT: 8/min.   On 05/16/2021: MMSE: 18/30, CDT: 3/4, AFT: 9/min.  Cranial nerves II - XII are as described above under HEENT exam.  Motor exam: Normal bulk, strength and tone is noted. There is no tremor or rebound. Fine motor skills and coordination: Grossly intact for age.  Cerebellar testing: No dysmetria or intention tremor. There is no truncal or gait ataxia. Sensory exam: intact to light touch in the upper and lower extremities. Gait, station and balance: She stands easily. No veering to one side is noted. No leaning to one side is noted. Posture is age-appropriate and stance is narrow based. Gait shows normal stride length and normal pace. No problems turning are noted.   Assessment and Plan:    In summary, Summit Borchardt is a very pleasant 87 year old female with an underlying medical history of hypertension, migraines, osteoporosis, and depression, who presents for follow-up consultation of her memory loss.  She has been on donepezil for some years and started Namenda low-dose generic 5 mg twice daily in October 2021.  She tolerates it well.  Weight has been fairly stable, appetite good.  We talked about the importance of healthy lifestyle.  We talked about her driving again today.  I do not believe she is fully safe to drive and therefore she was advised no longer to drive.  She has been summoned to  jury duty but they will address this with the court house today according to her daughter who is visiting from [place].  Her other daughter visits from [place] and locally, she has granddaughters who check on her regularly.  I do not recommend that she use the stove to cook any longer.  She has been able to tolerate the Namenda at 5 mg twice daily and was advised to increase it at this point to 10 mg twice daily and continue with generic donepezil 10 mg once daily as well.  She was agreeable.  She is advised to follow-up routinely to see Ward Givens, NP in 6 months, sooner if needed.  I answered all their questions today and the patient and her daughter were in agreement.I spent 30 minutes in total face-to-face time and in reviewing records during pre-charting, more than 50% of which was spent in counseling and coordination of care, reviewing test results, reviewing medications and treatment regimen and/or in discussing or reviewing the diagnosis of dementia, the prognosis and treatment options. Pertinent laboratory and imaging test results that were available during this visit with the patient were reviewed by me and considered in my medical decision making (see chart for details).

## 2021-05-27 DIAGNOSIS — R54 Age-related physical debility: Secondary | ICD-10-CM | POA: Diagnosis not present

## 2021-05-27 DIAGNOSIS — I10 Essential (primary) hypertension: Secondary | ICD-10-CM | POA: Diagnosis not present

## 2021-05-27 DIAGNOSIS — D509 Iron deficiency anemia, unspecified: Secondary | ICD-10-CM | POA: Diagnosis not present

## 2021-05-27 DIAGNOSIS — J432 Centrilobular emphysema: Secondary | ICD-10-CM | POA: Diagnosis not present

## 2021-05-27 DIAGNOSIS — D649 Anemia, unspecified: Secondary | ICD-10-CM | POA: Diagnosis not present

## 2021-05-27 DIAGNOSIS — G8929 Other chronic pain: Secondary | ICD-10-CM | POA: Diagnosis not present

## 2021-06-10 ENCOUNTER — Other Ambulatory Visit: Payer: Self-pay

## 2021-06-10 ENCOUNTER — Encounter: Payer: Self-pay | Admitting: Podiatry

## 2021-06-10 ENCOUNTER — Ambulatory Visit: Payer: Medicare Other | Admitting: Podiatry

## 2021-06-10 DIAGNOSIS — M79674 Pain in right toe(s): Secondary | ICD-10-CM | POA: Diagnosis not present

## 2021-06-10 DIAGNOSIS — M79671 Pain in right foot: Secondary | ICD-10-CM

## 2021-06-10 DIAGNOSIS — M79675 Pain in left toe(s): Secondary | ICD-10-CM

## 2021-06-10 DIAGNOSIS — B351 Tinea unguium: Secondary | ICD-10-CM | POA: Diagnosis not present

## 2021-06-10 DIAGNOSIS — Q828 Other specified congenital malformations of skin: Secondary | ICD-10-CM

## 2021-06-10 DIAGNOSIS — M79672 Pain in left foot: Secondary | ICD-10-CM

## 2021-06-12 ENCOUNTER — Encounter: Payer: Self-pay | Admitting: Podiatry

## 2021-06-12 NOTE — Progress Notes (Signed)
Subjective: Amanda [name] is a pleasant 87 y.o. female patient seen today for panful calluses and painful thick toenails that are difficult to trim. Pain interferes with ambulation. Aggravating factors include wearing enclosed shoe gear. Pain is relieved with periodic professional debridement.  PCP is Caren Macadam, MD. Last visit was: 02/11/2021.  No Known Allergies  Objective: Physical Exam  General: Amanda [name] is a pleasant 87 y.o. African American female, in NAD. AAO x 3.   Vascular:  Capillary refill time to digits immediate b/l. Palpable DP pulse(s) b/l lower extremities Palpable PT pulse(s) b/l lower extremities Pedal hair sparse. Lower extremity skin temperature gradient within normal limits. No edema noted b/l lower extremities.  Dermatological:  Pedal skin with normal turgor, texture and tone b/l lower extremities. No open wounds b/l lower extremities. No interdigital macerations b/l lower extremities. Toenails 1-5 b/l elongated, discolored, dystrophic, thickened, crumbly with subungual debris and tenderness to dorsal palpation. Porokeratotic lesion(s) submet head 5 left foot and plantar aspect of heel right foot. No erythema, no edema, no drainage, no fluctuance.  Musculoskeletal:  Normal muscle strength 5/5 to all lower extremity muscle groups bilaterally. No pain crepitus or joint limitation noted with ROM b/l. No gross bony deformities bilaterally.  Neurological:  Protective sensation intact 5/5 intact bilaterally with 10g monofilament b/l. Vibratory sensation intact b/l.  Assessment and Plan:  1. Pain due to onychomycosis of toenails of both feet   2. Porokeratosis   3. Pain in both feet   -Examined patient. -Patient to continue soft, supportive shoe gear daily. -Toenails 1-5 b/l were debrided in length and girth with sterile nail nippers and dremel without iatrogenic bleeding.  -Painful porokeratotic lesion(s) submet head 5 left foot and plantar aspect of heel  right foot pared and enucleated with sterile scalpel blade without incident. Total number of lesions debrided=2. -Patient to report any pedal injuries to medical professional immediately. -Patient/POA to call should there be question/concern in the interim.  Return in about 3 months (around 09/10/2021).  Marzetta Board, DPM

## 2021-06-14 DIAGNOSIS — J432 Centrilobular emphysema: Secondary | ICD-10-CM | POA: Diagnosis not present

## 2021-07-07 DIAGNOSIS — H1045 Other chronic allergic conjunctivitis: Secondary | ICD-10-CM | POA: Diagnosis not present

## 2021-07-07 DIAGNOSIS — H04123 Dry eye syndrome of bilateral lacrimal glands: Secondary | ICD-10-CM | POA: Diagnosis not present

## 2021-07-08 DIAGNOSIS — D509 Iron deficiency anemia, unspecified: Secondary | ICD-10-CM | POA: Diagnosis not present

## 2021-07-08 DIAGNOSIS — R1013 Epigastric pain: Secondary | ICD-10-CM | POA: Diagnosis not present

## 2021-07-08 DIAGNOSIS — R21 Rash and other nonspecific skin eruption: Secondary | ICD-10-CM | POA: Diagnosis not present

## 2021-07-15 DIAGNOSIS — J432 Centrilobular emphysema: Secondary | ICD-10-CM | POA: Diagnosis not present

## 2021-07-19 DIAGNOSIS — D649 Anemia, unspecified: Secondary | ICD-10-CM | POA: Diagnosis not present

## 2021-07-19 DIAGNOSIS — M19012 Primary osteoarthritis, left shoulder: Secondary | ICD-10-CM | POA: Diagnosis not present

## 2021-07-19 DIAGNOSIS — R54 Age-related physical debility: Secondary | ICD-10-CM | POA: Diagnosis not present

## 2021-07-19 DIAGNOSIS — G8929 Other chronic pain: Secondary | ICD-10-CM | POA: Diagnosis not present

## 2021-07-19 DIAGNOSIS — M19011 Primary osteoarthritis, right shoulder: Secondary | ICD-10-CM | POA: Diagnosis not present

## 2021-07-19 DIAGNOSIS — J432 Centrilobular emphysema: Secondary | ICD-10-CM | POA: Diagnosis not present

## 2021-07-19 DIAGNOSIS — I10 Essential (primary) hypertension: Secondary | ICD-10-CM | POA: Diagnosis not present

## 2021-07-19 DIAGNOSIS — D509 Iron deficiency anemia, unspecified: Secondary | ICD-10-CM | POA: Diagnosis not present

## 2021-07-23 ENCOUNTER — Emergency Department (HOSPITAL_COMMUNITY)
Admission: EM | Admit: 2021-07-23 | Discharge: 2021-07-23 | Disposition: A | Discharge status: Home / Self Care | Payer: Medicare Other | Attending: Emergency Medicine | Admitting: Emergency Medicine

## 2021-07-23 ENCOUNTER — Emergency Department (HOSPITAL_COMMUNITY): Payer: Medicare Other

## 2021-07-23 ENCOUNTER — Encounter (HOSPITAL_COMMUNITY): Payer: Self-pay

## 2021-07-23 DIAGNOSIS — W228XXA Striking against or struck by other objects, initial encounter: Secondary | ICD-10-CM | POA: Diagnosis not present

## 2021-07-23 DIAGNOSIS — Z87891 Personal history of nicotine dependence: Secondary | ICD-10-CM | POA: Insufficient documentation

## 2021-07-23 DIAGNOSIS — Z79899 Other long term (current) drug therapy: Secondary | ICD-10-CM | POA: Insufficient documentation

## 2021-07-23 DIAGNOSIS — S93401A Sprain of unspecified ligament of right ankle, initial encounter: Secondary | ICD-10-CM

## 2021-07-23 DIAGNOSIS — S99921A Unspecified injury of right foot, initial encounter: Secondary | ICD-10-CM | POA: Diagnosis not present

## 2021-07-23 DIAGNOSIS — I10 Essential (primary) hypertension: Secondary | ICD-10-CM | POA: Insufficient documentation

## 2021-07-23 DIAGNOSIS — R52 Pain, unspecified: Secondary | ICD-10-CM

## 2021-07-23 DIAGNOSIS — M7989 Other specified soft tissue disorders: Secondary | ICD-10-CM | POA: Diagnosis not present

## 2021-07-23 DIAGNOSIS — S99911A Unspecified injury of right ankle, initial encounter: Secondary | ICD-10-CM | POA: Diagnosis present

## 2021-07-23 DIAGNOSIS — M79604 Pain in right leg: Secondary | ICD-10-CM | POA: Diagnosis not present

## 2021-07-23 MED ORDER — OXYCODONE-ACETAMINOPHEN 5-325 MG PO TABS
1.0000 | ORAL_TABLET | Freq: Once | ORAL | Status: AC
Start: 2021-07-23 — End: 2021-07-23
  Administered 2021-07-23: 1 via ORAL
  Filled 2021-07-23: qty 1

## 2021-07-23 MED ORDER — OXYCODONE-ACETAMINOPHEN 5-325 MG PO TABS: 1.0000 | ORAL_TABLET | Freq: Four times a day (QID) | ORAL | 0 refills | Status: AC | PRN

## 2021-07-23 NOTE — ED Provider Notes (Signed)
Emergency Medicine Provider Triage Evaluation Note  Amanda [name] , a 87 y.o. female  was evaluated in triage.  Pt complains of right foot pain, radiates up right leg, hit foot on chair yesterday.  Review of Systems  Positive: Foot pain Negative: swelling  Physical Exam  BP (!) 151/94   Pulse 75   Temp 98.4 F (36.9 C) (Oral)   Resp 16   SpO2 92%  Gen:   Awake, no distress   Resp:  Normal effort  MSK:   Moves extremities without difficulty  Other:  DP pulse present, diffuse right foot pain, skin intact, no swelling  Medical Decision Making  Medically screening exam initiated at 3:28 PM.  Appropriate orders placed.  Amanda [name] was informed that the remainder of the evaluation will be completed by another provider, this initial triage assessment does not replace that evaluation, and the importance of remaining in the ED until their evaluation is complete.     Amanda Learn, PA-C 07/23/21 Amanda [name], Ankit, MD 07/24/21 [phone]

## 2021-07-23 NOTE — ED Provider Notes (Signed)
[facility] EMERGENCY DEPARTMENT Provider Note   CSN: [id number] Arrival date & time: 07/23/21  1445     History Chief Complaint  Patient presents with   Leg Pain   Foot Pain    Amanda [name] is a 87 y.o. female.  Presents ER with concern for foot and ankle pain.  Patient states that she hit her foot on a chair yesterday and has been having pain ever since.  Pain is worse in her foot and ankle.  Has noted some swelling.  Pain radiates up the leg but not past the knee.  No numbness or weakness.  Up to 10 out of 10 in severity sharp and stabbing.  Worse with movement, improved with rest.  Denies hitting her head or any other bodily injury.  HPI     Past Medical History:  Diagnosis Date   Depression    Hyperlipidemia    Hypertension    Memory disorder    Osteoporosis    PVC (premature ventricular contraction)     Patient Active Problem List   Diagnosis Date Noted   Personal history of colonic polyps 03/21/2021   Plantar wart of right foot 03/21/2021   Recurrent falls 03/21/2021   Recurrent major depression in remission ([place]) 03/21/2021   Urinary incontinence 03/21/2021   Vitamin B12 deficiency 03/21/2021   Abnormal gait 02/25/2021   Centrilobular emphysema ([place]) 02/25/2021   Chronic pain 02/25/2021   Dementia ([place]) 02/25/2021   Dizzy 02/25/2021   Frailty 02/25/2021   Hardening of the aorta (main artery of the heart) ([place]) 02/25/2021   Hemoglobin low 02/25/2021   Iron deficiency anemia 02/25/2021   Loss of appetite 02/25/2021   Malnutrition of moderate degree ([place]: 60% to less than 75% of standard weight) ([place]) 02/25/2021   Mild major depression, single episode ([place]) 02/25/2021   Localized, primary osteoarthritis of shoulder region 02/25/2021   DOE (dyspnea on exertion) 12/11/2019   Educated about COVID-19 virus infection 12/11/2019   Loss of weight 03/01/2016   Hyperlipidemia 10/25/2015   PVC (premature ventricular contraction) 01/21/2015    Hypertensive cardiovascular disease 01/21/2015   Chest pain 01/05/2015   HTN (hypertension) 01/05/2015    Past Surgical History:  Procedure Laterality Date   PARTIAL HYSTERECTOMY       OB History   No obstetric history on file.     Family History  Problem Relation Age of Onset   Lung cancer Neg Hx     Social History   Tobacco Use   Smoking status: Former    Years: 17.00    Types: Cigarettes    Start date: 10/29/1954    Quit date: 10/30/1971    Years since quitting: 49.7   Smokeless tobacco: Never   Tobacco comments:    smoked 3-4/day  Substance Use Topics   Alcohol use: No   Drug use: No    Home Medications Prior to Admission medications   Medication Sig Start Date End Date Taking? Authorizing Provider  albuterol (PROVENTIL) (2.5 MG/3ML) 0.083% nebulizer solution Take 3 mLs (2.5 mg total) by nebulization every 6 (six) hours as needed for wheezing or shortness of breath. 03/23/21   Spero Geralds, MD  albuterol (VENTOLIN HFA) 108 (90 Base) MCG/ACT inhaler INHALE TWO PUFFS BY MOUTH INTO LUNGS every SIX hours AS NEEDED 02/18/21   Spero Geralds, MD  amLODipine (NORVASC) 10 MG tablet Take 10 mg by mouth daily.    [provider]  citalopram (CELEXA) 20 MG tablet Take 20  mg by mouth daily.    [provider]  donepezil (ARICEPT) 10 MG tablet Take 10 mg by mouth at bedtime. 09/07/20   [provider]  ferrous sulfate 325 (65 FE) MG tablet 1 tablet 02/16/21   [provider]  memantine (NAMENDA) 10 MG tablet Take 1 tablet (10 mg total) by mouth 2 (two) times daily. 05/16/21   Star Age, MD  Methylcobalamin (B12-ACTIVE) 1 MG CHEW 1 tablet    [provider]  pantoprazole (PROTONIX) 40 MG tablet Take 1 tablet by mouth every morning.    [provider]  vitamin B-12 (CYANOCOBALAMIN) 1000 MCG tablet Take 1,000 mcg by mouth daily.     [provider]    Allergies    Patient has no known allergies.  Review of  Systems   Review of Systems  Musculoskeletal:  Positive for arthralgias.  All other systems reviewed and are negative.  Physical Exam Updated Vital Signs BP (!) 151/94   Pulse 75   Temp 98.4 F (36.9 C) (Oral)   Resp 16   Ht '5\' 1"'$  (1.549 m)   Wt 52 kg   SpO2 92%   BMI 21.66 kg/m   Physical Exam Vitals and nursing note reviewed.  Constitutional:      General: She is not in acute distress.    Appearance: She is well-developed.  HENT:     Head: Normocephalic and atraumatic.  Eyes:     Conjunctiva/sclera: Conjunctivae normal.  Cardiovascular:     Rate and Rhythm: Normal rate and regular rhythm.     Heart sounds: No murmur heard. Pulmonary:     Effort: Pulmonary effort is normal. No respiratory distress.     Breath sounds: Normal breath sounds.  Abdominal:     Palpations: Abdomen is soft.     Tenderness: There is no abdominal tenderness.  Musculoskeletal:     Cervical back: Neck supple.     Comments: Right lower extremity: No tenderness over hip, knee; there is some tenderness over the lateral malleolus and general tenderness of the foot, mild swelling of foot and ankle but no significant ecchymosis or deformity appreciated, normal DP/PT pulse, normal sensation  Skin:    General: Skin is warm and dry.  Neurological:     Mental Status: She is alert.    ED Results / Procedures / Treatments   Labs (all labs ordered are listed, but only abnormal results are displayed) Labs Reviewed - No data to display  EKG None  Radiology DG Ankle Complete Right  Result Date: 07/23/2021 CLINICAL DATA:  Right leg pain following right leg injury EXAM: RIGHT ANKLE - COMPLETE 3+ VIEW COMPARISON:  None. FINDINGS: Imaging is slightly limited by obliquity on lateral examination, however, there is normal alignment. No definite fracture or dislocation. Ankle mortise appears aligned. No ankle effusion. Mild soft tissue swelling is noted dorsal to the midfoot. IMPRESSION: Mild soft tissue swelling  dorsal to the midfoot. No acute fracture or dislocation. Electronically Signed   By: Fidela Salisbury M.D.   On: 07/23/2021 19:24   DG Foot Complete Right  Result Date: 07/23/2021 CLINICAL DATA:  Pain, injury yesterday.  Struck foot against a door. EXAM: RIGHT FOOT COMPLETE - 3+ VIEW COMPARISON:  None. FINDINGS: There is no evidence of fracture or dislocation. Scattered osteoarthritis with spurring and joint space narrowing. No erosion. Small Achilles tendon enthesophyte. IMPRESSION: No fracture or subluxation of the right foot. Scattered osteoarthritis. Electronically Signed   By: Aurther Loft.D.  On: 07/23/2021 16:41    Procedures Procedures   Medications Ordered in ED Medications  oxyCODONE-acetaminophen (PERCOCET/ROXICET) 5-325 MG per tablet 1 tablet (1 tablet Oral Given 07/23/21 1826)    ED Course  I have reviewed the triage vital signs and the nursing notes.  Pertinent labs & imaging results that were available during my care of the patient were reviewed by me and considered in my medical decision making (see chart for details).    MDM Rules/Calculators/A&P                           87 year old lady presenting to ER with concern for right ankle/foot pain after injury yesterday.  On exam some tenderness to the ankle and foot was appreciated but no significant deformity noted.  Neurovascular intact.  Plain films negative for fracture or dislocation.  Provided symptomatic control in ER, pain improved.  Will provide brace for support.  Recommend follow-up with primary doctor.  Consulted case management to assist with home health and getting patient walker to help with mobility.    After the discussed management above, the patient was determined to be safe for discharge.  The patient was in agreement with this plan and all questions regarding their care were answered.  ED return precautions were discussed and the patient will return to the ED with any significant worsening of  condition.  Final Clinical Impression(s) / ED Diagnoses Final diagnoses:  Sprain of right ankle, unspecified ligament, initial encounter    Rx / DC Orders ED Discharge Orders     None        Lucrezia Starch, MD 07/23/21 2015

## 2021-07-23 NOTE — Progress Notes (Signed)
Orthopedic Tech Progress Note Patient Details:  Amanda [name] 1933/10/27 [id number]  Ortho Devices Type of Ortho Device: ASO Ortho Device/Splint Location: RLE Ortho Device/Splint Interventions: Ordered, Application   Post Interventions Patient Tolerated: Well Instructions Provided: Care of [name] 07/23/2021, 9:11 PM

## 2021-07-23 NOTE — ED Notes (Signed)
Daughter Eyvonne Left [phone] would like an update

## 2021-07-23 NOTE — ED Triage Notes (Signed)
Pt arrives POV for eval of R sided foot/ankle pain. Started after striking foot on a chair yesterday. Foot is warm, well perfused, palpable pulse in triage. Pt visibly uncomfortable in triage

## 2021-07-23 NOTE — Discharge Instructions (Signed)
Recommend following up with your primary care doctor.  Recommend rest, ice and elevate.  Can bear weight as tolerated.  Recommend Tylenol or Motrin for pain control.  For severe pain, can take the prescribed Percocet as needed.  Note this can make you mildly drowsy.  Do not take while driving.

## 2021-07-24 ENCOUNTER — Telehealth: Payer: Self-pay

## 2021-07-24 NOTE — Telephone Encounter (Signed)
Called patient no answer but did receive a call back for grandaughter who is in communication with her grandmother. She is accepting of home health and walker and has family support. She previously had [facility] for home health, Reached out to [employer] and ordered Walker to be shipped to home from [facility] [facility]

## 2021-07-25 DIAGNOSIS — J432 Centrilobular emphysema: Secondary | ICD-10-CM | POA: Diagnosis not present

## 2021-07-25 DIAGNOSIS — R531 Weakness: Secondary | ICD-10-CM | POA: Diagnosis not present

## 2021-07-29 ENCOUNTER — Emergency Department (HOSPITAL_BASED_OUTPATIENT_CLINIC_OR_DEPARTMENT_OTHER): Payer: Medicare Other

## 2021-07-29 ENCOUNTER — Other Ambulatory Visit: Payer: Self-pay

## 2021-07-29 ENCOUNTER — Inpatient Hospital Stay (HOSPITAL_BASED_OUTPATIENT_CLINIC_OR_DEPARTMENT_OTHER)
Admission: EM | Admit: 2021-07-29 | Discharge: 2021-08-02 | DRG: 177 | Disposition: A | Discharge status: Home / Self Care | Payer: Medicare Other | Attending: Family Medicine | Admitting: Family Medicine

## 2021-07-29 ENCOUNTER — Encounter (HOSPITAL_BASED_OUTPATIENT_CLINIC_OR_DEPARTMENT_OTHER): Payer: Self-pay

## 2021-07-29 DIAGNOSIS — R296 Repeated falls: Secondary | ICD-10-CM | POA: Diagnosis present

## 2021-07-29 DIAGNOSIS — M810 Age-related osteoporosis without current pathological fracture: Secondary | ICD-10-CM | POA: Diagnosis not present

## 2021-07-29 DIAGNOSIS — E860 Dehydration: Secondary | ICD-10-CM | POA: Diagnosis not present

## 2021-07-29 DIAGNOSIS — I10 Essential (primary) hypertension: Secondary | ICD-10-CM | POA: Diagnosis not present

## 2021-07-29 DIAGNOSIS — D3500 Benign neoplasm of unspecified adrenal gland: Secondary | ICD-10-CM | POA: Diagnosis not present

## 2021-07-29 DIAGNOSIS — E785 Hyperlipidemia, unspecified: Secondary | ICD-10-CM | POA: Diagnosis not present

## 2021-07-29 DIAGNOSIS — J811 Chronic pulmonary edema: Secondary | ICD-10-CM | POA: Diagnosis not present

## 2021-07-29 DIAGNOSIS — Z87891 Personal history of nicotine dependence: Secondary | ICD-10-CM

## 2021-07-29 DIAGNOSIS — F3340 Major depressive disorder, recurrent, in remission, unspecified: Secondary | ICD-10-CM | POA: Diagnosis not present

## 2021-07-29 DIAGNOSIS — R059 Cough, unspecified: Secondary | ICD-10-CM | POA: Diagnosis not present

## 2021-07-29 DIAGNOSIS — R42 Dizziness and giddiness: Secondary | ICD-10-CM | POA: Diagnosis not present

## 2021-07-29 DIAGNOSIS — J432 Centrilobular emphysema: Secondary | ICD-10-CM | POA: Diagnosis present

## 2021-07-29 DIAGNOSIS — F0390 Unspecified dementia without behavioral disturbance: Secondary | ICD-10-CM | POA: Diagnosis present

## 2021-07-29 DIAGNOSIS — S0990XA Unspecified injury of head, initial encounter: Secondary | ICD-10-CM | POA: Diagnosis not present

## 2021-07-29 DIAGNOSIS — J9601 Acute respiratory failure with hypoxia: Secondary | ICD-10-CM | POA: Diagnosis present

## 2021-07-29 DIAGNOSIS — Z79899 Other long term (current) drug therapy: Secondary | ICD-10-CM | POA: Diagnosis not present

## 2021-07-29 DIAGNOSIS — U071 COVID-19: Principal | ICD-10-CM | POA: Diagnosis present

## 2021-07-29 DIAGNOSIS — Z90711 Acquired absence of uterus with remaining cervical stump: Secondary | ICD-10-CM

## 2021-07-29 DIAGNOSIS — I517 Cardiomegaly: Secondary | ICD-10-CM | POA: Diagnosis not present

## 2021-07-29 DIAGNOSIS — K5730 Diverticulosis of large intestine without perforation or abscess without bleeding: Secondary | ICD-10-CM | POA: Diagnosis not present

## 2021-07-29 DIAGNOSIS — I493 Ventricular premature depolarization: Secondary | ICD-10-CM | POA: Diagnosis present

## 2021-07-29 DIAGNOSIS — I700 Atherosclerosis of aorta: Secondary | ICD-10-CM | POA: Diagnosis not present

## 2021-07-29 DIAGNOSIS — F32A Depression, unspecified: Secondary | ICD-10-CM | POA: Diagnosis present

## 2021-07-29 DIAGNOSIS — E876 Hypokalemia: Secondary | ICD-10-CM | POA: Diagnosis not present

## 2021-07-29 DIAGNOSIS — K219 Gastro-esophageal reflux disease without esophagitis: Secondary | ICD-10-CM | POA: Diagnosis present

## 2021-07-29 LAB — CBC
HCT: 41.9 % (ref 36.0–46.0)
Hemoglobin: 14 g/dL (ref 12.0–15.0)
MCH: 30.2 pg (ref 26.0–34.0)
MCHC: 33.4 g/dL (ref 30.0–36.0)
MCV: 90.5 fL (ref 80.0–100.0)
Platelets: 241 10*3/uL (ref 150–400)
RBC: 4.63 MIL/uL (ref 3.87–5.11)
RDW: 14.7 % (ref 11.5–15.5)
WBC: 6.1 10*3/uL (ref 4.0–10.5)
nRBC: 0 % (ref 0.0–0.2)

## 2021-07-29 LAB — URINALYSIS, ROUTINE W REFLEX MICROSCOPIC
Bilirubin Urine: NEGATIVE
Glucose, UA: NEGATIVE mg/dL
Hgb urine dipstick: NEGATIVE
Ketones, ur: NEGATIVE mg/dL
Leukocytes,Ua: NEGATIVE
Nitrite: NEGATIVE
Protein, ur: NEGATIVE mg/dL
Specific Gravity, Urine: 1.02 (ref 1.005–1.030)
pH: 6 (ref 5.0–8.0)

## 2021-07-29 LAB — BASIC METABOLIC PANEL
Anion gap: 11 (ref 5–15)
BUN: 14 mg/dL (ref 8–23)
CO2: 33 mmol/L — ABNORMAL HIGH (ref 22–32)
Calcium: 9.3 mg/dL (ref 8.9–10.3)
Chloride: 91 mmol/L — ABNORMAL LOW (ref 98–111)
Creatinine, Ser: 0.78 mg/dL (ref 0.44–1.00)
GFR, Estimated: >60 mL/min (ref >60)
Glucose, Bld: 110 mg/dL — ABNORMAL HIGH (ref 70–99)
Potassium: 2.8 mmol/L — ABNORMAL LOW (ref 3.5–5.1)
Sodium: 135 mmol/L (ref 135–145)

## 2021-07-29 LAB — RESP PANEL BY RT-PCR (FLU A&B, COVID) ARPGX2
Influenza A by PCR: NEGATIVE
Influenza B by PCR: NEGATIVE
SARS Coronavirus 2 by RT PCR: POSITIVE — AB

## 2021-07-29 LAB — CBG MONITORING, ED: Glucose-Capillary: 103 mg/dL — ABNORMAL HIGH (ref 70–99)

## 2021-07-29 LAB — MAGNESIUM: Magnesium: 1.8 mg/dL (ref 1.7–2.4)

## 2021-07-29 MED ORDER — POTASSIUM CHLORIDE CRYS ER 20 MEQ PO TBCR: 30.0000 meq | EXTENDED_RELEASE_TABLET | Freq: Once | ORAL | Status: DC

## 2021-07-29 MED ORDER — LACTATED RINGERS IV BOLUS
1000.0000 mL | Freq: Once | INTRAVENOUS | Status: AC
  Administered 2021-07-29: 1000 mL via INTRAVENOUS

## 2021-07-29 MED ORDER — SODIUM CHLORIDE 0.9 % IV SOLN
100.0000 mg | INTRAVENOUS | Status: AC
  Administered 2021-07-30 (×2): 100 mg via INTRAVENOUS

## 2021-07-29 MED ORDER — METHYLPREDNISOLONE SODIUM SUCC 125 MG IJ SOLR
60.0000 mg | Freq: Once | INTRAMUSCULAR | Status: AC
  Administered 2021-07-30: 60 mg via INTRAVENOUS
  Filled 2021-07-29: qty 2

## 2021-07-29 MED ORDER — SODIUM CHLORIDE 0.9 % IV SOLN
100.0000 mg | Freq: Every day | INTRAVENOUS | Status: AC
  Administered 2021-07-30 – 2021-08-02 (×4): 100 mg via INTRAVENOUS
  Filled 2021-07-29 (×3): qty 20

## 2021-07-29 MED ORDER — POTASSIUM CHLORIDE 10 MEQ/100ML IV SOLN
10.0000 meq | INTRAVENOUS | Status: AC
  Administered 2021-07-29 (×4): 10 meq via INTRAVENOUS
  Filled 2021-07-29 (×4): qty 100

## 2021-07-29 MED ORDER — POTASSIUM CHLORIDE CRYS ER 20 MEQ PO TBCR
40.0000 meq | EXTENDED_RELEASE_TABLET | Freq: Once | ORAL | Status: AC
  Administered 2021-07-29: 40 meq via ORAL
  Filled 2021-07-29: qty 2

## 2021-07-29 NOTE — ED Notes (Addendum)
Patient's family member reports patient fell earlier today and was reporting weakness and dizziness over last few days.  Also reports cough last 2 days.

## 2021-07-29 NOTE — ED Triage Notes (Addendum)
Pt c/o dizziness x 5 days-fell in her home this am-states "feel sore all over"-denies head/neck injury-denies LOC-NAD-to triage in w/c-granddaughter with pt-pt alter added she has had prod cough x ~2 weeks-states she uses inhaler prn-not recently

## 2021-07-29 NOTE — ED Provider Notes (Signed)
Physical Exam  BP (!) 174/86 (BP Location: Right Arm)   Pulse 63   Temp 99.3 F (37.4 C)   Resp (!) 118   SpO2 98%   Physical Exam Vitals and nursing note reviewed.  Constitutional:      General: She is not in acute distress.    Appearance: She is well-developed. She is not diaphoretic.  HENT:     Head: Normocephalic and atraumatic.  Eyes:     General: No scleral icterus.    Conjunctiva/sclera: Conjunctivae normal.  Pulmonary:     Effort: Pulmonary effort is normal. No respiratory distress.  Musculoskeletal:     Cervical back: Normal range of motion.  Skin:    Findings: No rash.  Neurological:     Mental Status: She is alert.    ED Course/Procedures   Clinical Course as of 07/29/21 2343  Fri Jul 29, 2021  2001 SARS Coronavirus 2 by RT PCR(!): POSITIVE [HK]  2002 Potassium(!): 2.8 Repleting [HK]  2304 CLINICAL DATA: Abdomen pain  EXAM: CT ABDOMEN AND PELVIS WITHOUT CONTRAST  TECHNIQUE: Multidetector CT imaging of the abdomen and pelvis was performed following the standard protocol without IV contrast.  COMPARISON: CT 04/04/2021  FINDINGS: Lower chest: Lung bases demonstrate dependent atelectasis. No pleural effusion. Borderline cardiomegaly.  Hepatobiliary: No focal liver abnormality is seen. No gallstones, gallbladder wall thickening, or biliary dilatation.  Pancreas: Unremarkable. No pancreatic ductal dilatation or surrounding inflammatory changes.  Spleen: Normal in size without focal abnormality.  Adrenals/Urinary Tract: Right adrenal gland is normal. 2.5 cm left adrenal adenoma. Kidneys show no hydronephrosis. The bladder is normal  Stomach/Bowel: Decompressed stomach with radiodense material. Questionable proximal gastric wall thickening but stomach is decompressed. No dilated small bowel. Diffuse diverticular disease of the colon without acute wall thickening. Appendix within normal limits.  Vascular/Lymphatic: Advanced aortic  atherosclerosis. No aneurysm. No suspicious nodes.  Reproductive: Status post hysterectomy. Dense left adnexal calcification without change  Other: Negative for pelvic effusion or free air.  Musculoskeletal: No acute or suspicious osseous abnormality  IMPRESSION: 1. No definite CT evidence for acute intra-abdominal or pelvic abnormality. 2. Possible proximal gastric wall thickening/gastritis but stomach is decompressed 3. Diverticular disease of the colon without acute wall thickening 4. Stable left adrenal adenoma [HK]    Clinical Course User Index [HK] Delia Heady, PA-C    Procedures  MDM   Care of patient assumed from PA Prosperi at 8 PM.  Agree with history, physical exam and plan.  See their note for further details.  Briefly, 87 y.o. female with PMH/PSH as below who presents with 5 days of dizziness, weakness.  Reports frequent falls.  Reports productive cough for the past 2 weeks.  Denies any fever, vomiting.  Work-up significant for hypokalemia of 2.8 which was repleted IV.  EKG shows ST and T wave abnormalities.  Found to be COVID-positive here.  Oxygen saturations in the low 90s and placed on 2 L via nasal cannula with improvement in shortness of breath.  Past Medical History:  Diagnosis Date   Depression    Hyperlipidemia    Hypertension    Memory disorder    Osteoporosis    PVC (premature ventricular contraction)    Past Surgical History:  Procedure Laterality Date   PARTIAL HYSTERECTOMY        Current Plan: Obtain CT of the head, abdomen pelvis due to her symptoms.   MDM/ED Course: CT of the head is negative for acute abnormality.  CT of the abdomen pelvis  shows no acute findings, possible concern for gastritis.  With ambulation patient reports dyspnea, dizziness and oxygen saturations in the low 90s.  Due to her comorbidities, age and COVID infection feel that she will benefit from admission for ongoing management of her symptoms.  Will admit to  medicine service. Remdesivir and Solu-Medrol ordered.  Consults: Hospitalist  Significant labs/images: Labs Reviewed  RESP PANEL BY RT-PCR (FLU A&B, COVID) ARPGX2 - Abnormal; Notable for the following components:      Result Value   SARS Coronavirus 2 by RT PCR POSITIVE (*)    All other components within normal limits  BASIC METABOLIC PANEL - Abnormal; Notable for the following components:   Potassium 2.8 (*)    Chloride 91 (*)    CO2 33 (*)    Glucose, Bld 110 (*)    All other components within normal limits  CBG MONITORING, ED - Abnormal; Notable for the following components:   Glucose-Capillary 103 (*)    All other components within normal limits  CBC  URINALYSIS, ROUTINE W REFLEX MICROSCOPIC  MAGNESIUM    I personally reviewed and interpreted all labs.  The plan for this patient was discussed with Dr. Tamera Punt, who voiced agreement and who oversaw evaluation and treatment of this patient.     Delia Heady, PA-C 07/29/21 [id number]    Malvin Johns, MD 08/03/21 [phone]

## 2021-07-29 NOTE — ED Notes (Signed)
Pt ambulating heart rate 85, O2 90

## 2021-07-29 NOTE — ED Notes (Signed)
Pt does not have to urinate at this time, cup provided

## 2021-07-29 NOTE — ED Provider Notes (Signed)
[facility] EMERGENCY DEPARTMENT Provider Note   CSN: [id number] Arrival date & time: 07/29/21  1446     History Chief Complaint  Patient presents with  . Dizziness    Amanda [name] is a 87 y.o. female with a past medical history significant for dementia, hypertension, recurrent falls who presents with 5 days of dizziness, and weakness.  Patient fell in her home this morning, reports mechanical, nonsyncopal fall, is not on blood thinners, did not hit head, no loss of consciousness.  Patient also complains of productive cough for the last 2 weeks, she occasionally uses an inhaler, but has not recently.  Patient denies fever, nausea, vomiting, chest pain, shortness of breath, recent sick contacts. Does endorse some abdominal pain.  Initially denies head injury however does endorse she maybe hit her head on subsequent interview with Dr. Tamera Punt.   Dizziness Associated symptoms: no chest pain, no palpitations and no shortness of breath       Past Medical History:  Diagnosis Date  . Depression   . Hyperlipidemia   . Hypertension   . Memory disorder   . Osteoporosis   . PVC (premature ventricular contraction)     Patient Active Problem List   Diagnosis Date Noted  . Personal history of colonic polyps 03/21/2021  . Plantar wart of right foot 03/21/2021  . Recurrent falls 03/21/2021  . Recurrent major depression in remission ([place]) 03/21/2021  . Urinary incontinence 03/21/2021  . Vitamin B12 deficiency 03/21/2021  . Abnormal gait 02/25/2021  . Centrilobular emphysema ([place]) 02/25/2021  . Chronic pain 02/25/2021  . Dementia ([place]) 02/25/2021  . Dizzy 02/25/2021  . Frailty 02/25/2021  . Hardening of the aorta (main artery of the heart) ([place]) 02/25/2021  . Hemoglobin low 02/25/2021  . Iron deficiency anemia 02/25/2021  . Loss of appetite 02/25/2021  . Malnutrition of moderate degree [place]: 60% to less than 75% of standard weight) ([place]) 02/25/2021  . Mild major  depression, single episode ([place]) 02/25/2021  . Localized, primary osteoarthritis of shoulder region 02/25/2021  . DOE (dyspnea on exertion) 12/11/2019  . Educated about COVID-19 virus infection 12/11/2019  . Loss of weight 03/01/2016  . Hyperlipidemia 10/25/2015  . PVC (premature ventricular contraction) 01/21/2015  . Hypertensive cardiovascular disease 01/21/2015  . Chest pain 01/05/2015  . HTN (hypertension) 01/05/2015    Past Surgical History:  Procedure Laterality Date  . PARTIAL HYSTERECTOMY       OB History   No obstetric history on file.     Family History  Problem Relation Age of Onset  . Lung cancer Neg Hx     Social History   Tobacco Use  . Smoking status: Former    Years: 17.00    Types: Cigarettes    Start date: 10/29/1954    Quit date: 10/30/1971    Years since quitting: 49.7  . Smokeless tobacco: Never  . Tobacco comments:    smoked 3-4/day  Substance Use Topics  . Alcohol use: No  . Drug use: No    Home Medications Prior to Admission medications   Medication Sig Start Date End Date Taking? Authorizing Provider  albuterol (PROVENTIL) (2.5 MG/3ML) 0.083% nebulizer solution Take 3 mLs (2.5 mg total) by nebulization every 6 (six) hours as needed for wheezing or shortness of breath. 03/23/21   Spero Geralds, MD  albuterol (VENTOLIN HFA) 108 (90 Base) MCG/ACT inhaler INHALE TWO PUFFS BY MOUTH INTO LUNGS every SIX hours AS NEEDED 02/18/21   Spero Geralds, MD  amLODipine (NORVASC) 10 MG tablet Take 10 mg by mouth daily.    [provider]  citalopram (CELEXA) 20 MG tablet Take 20 mg by mouth daily.    [provider]  donepezil (ARICEPT) 10 MG tablet Take 10 mg by mouth at bedtime. 09/07/20   [provider]  ferrous sulfate 325 (65 FE) MG tablet 1 tablet 02/16/21   [provider]  memantine (NAMENDA) 10 MG tablet Take 1 tablet (10 mg total) by mouth 2 (two) times daily. 05/16/21   Star Age, MD  Methylcobalamin  (B12-ACTIVE) 1 MG CHEW 1 tablet    [provider]  pantoprazole (PROTONIX) 40 MG tablet Take 1 tablet by mouth every morning.    [provider]  vitamin B-12 (CYANOCOBALAMIN) 1000 MCG tablet Take 1,000 mcg by mouth daily.     [provider]    Allergies    Patient has no known allergies.  Review of Systems   Review of Systems  Respiratory:  Negative for chest tightness and shortness of breath.   Cardiovascular:  Negative for chest pain and palpitations.  Neurological:  Positive for dizziness and light-headedness. Negative for syncope.  All other systems reviewed and are negative.  Physical Exam Updated Vital Signs BP (!) 169/78 (BP Location: Right Arm)   Pulse (!) 56   Temp 99.2 F (37.3 C) (Oral)   Resp 18   SpO2 98%   Physical Exam Vitals and nursing note reviewed.  Constitutional:      General: She is not in acute distress.    Appearance: Normal appearance.  HENT:     Head: Normocephalic and atraumatic.     Nose: Congestion and rhinorrhea present.  Eyes:     General:        Right eye: No discharge.        Left eye: No discharge.  Cardiovascular:     Rate and Rhythm: Normal rate and regular rhythm.     Heart sounds: No murmur heard.   No friction rub. No gallop.  Pulmonary:     Effort: Pulmonary effort is normal.     Breath sounds: Normal breath sounds.     Comments: Some rhonchi, productive cough of yellow / green sputum Abdominal:     General: Bowel sounds are normal.     Palpations: Abdomen is soft.     Comments: Some tenderness to palpation epigastric region, without rebound, rigidity or guarding  Musculoskeletal:     Comments: No tenderness to palpation of all extremities.  Patient with ASO brace on right ankle, secondary to sprain a few days prior to arrival.  Per patient, she already obtained radiographic imaging of the affected limb, no fracture, there is a sprain.  Skin:    General: Skin is warm and dry.     Capillary  Refill: Capillary refill takes less than 2 seconds.  Neurological:     Mental Status: She is alert and oriented to person, place, and time.  Psychiatric:        Mood and Affect: Mood normal.        Behavior: Behavior normal.    ED Results / Procedures / Treatments   Labs (all labs ordered are listed, but only abnormal results are displayed) Labs Reviewed  RESP PANEL BY RT-PCR (FLU A&B, COVID) ARPGX2 - Abnormal; Notable for the following components:      Result Value   SARS Coronavirus 2 by RT PCR POSITIVE (*)    All other components within normal  limits  BASIC METABOLIC PANEL - Abnormal; Notable for the following components:   Potassium 2.8 (*)    Chloride 91 (*)    CO2 33 (*)    Glucose, Bld 110 (*)    All other components within normal limits  CBG MONITORING, ED - Abnormal; Notable for the following components:   Glucose-Capillary 103 (*)    All other components within normal limits  CBC  URINALYSIS, ROUTINE W REFLEX MICROSCOPIC  MAGNESIUM    EKG EKG Interpretation  Date/Time:  Friday July 29 2021 15:08:38 EDT Ventricular Rate:  72 PR Interval:  164 QRS Duration: 84 QT Interval:  298 QTC Calculation: 326 R Axis:   10 Text Interpretation: Normal sinus rhythm ST & T wave abnormality, consider lateral ischemia Abnormal ECG similar to prior tracing Confirmed by Malvin Johns [phone]) on 07/29/2021 6:11:08 PM  Radiology DG Chest Portable 1 View  Result Date: 07/29/2021 CLINICAL DATA:  Cough and dizziness. EXAM: PORTABLE CHEST 1 VIEW COMPARISON:  Chest x-ray dated January 18, 2021. FINDINGS: Unchanged mild cardiomegaly. Chronic pulmonary vascular congestion. No focal consolidation, pleural effusion, or pneumothorax. No acute osseous abnormality. IMPRESSION: No active disease. Electronically Signed   By: Titus Dubin M.D.   On: 07/29/2021 18:48    Procedures Procedures   Medications Ordered in ED Medications  potassium chloride 10 mEq in 100 mL IVPB (10 mEq  Intravenous New Bag/Given 07/29/21 1858)  potassium chloride SA (KLOR-CON) CR tablet 40 mEq (has no administration in time range)  lactated ringers bolus 1,000 mL (1,000 mLs Intravenous New Bag/Given 07/29/21 1857)    ED Course  I have reviewed the triage vital signs and the nursing notes.  Pertinent labs & imaging results that were available during my care of the patient were reviewed by me and considered in my medical decision making (see chart for details).  Clinical Course as of 07/29/21 2003  Fri Jul 29, 2021  2001 SARS Coronavirus 2 by RT PCR(!): POSITIVE [HK]  2002 Potassium(!): 2.8 Repleting [HK]    Clinical Course User Index [HK] Delia Heady, PA-C   MDM Rules/Calculators/A&P                         I discussed this case with my attending physician who cosigned this note including patient's presenting symptoms, physical exam, and planned diagnostics and interventions. Attending physician stated agreement with plan or made changes to plan which were implemented.   Attending physician assessed patient at bedside.  Initial lab work reveals electrolyte derangement of potassium at 2.8, will replete.  Will check magnesium.  Initial EKG shows some ST and T wave abnormalities.  Patient is positive for COVID-19, which fits with clinical picture of upper respiratory symptoms, easiness, weakness.  CBC, urinalysis without abnormality.  Patient with some oxygen desaturations that seem somewhat positional dependent, however even without position desaturating to 92 to 93%.  Placed on 2 L nasal cannula. Will reassess.  Given unclear history of head injury, abdominal pain, will obtain CT head and CT abd/pelvis.   7:52 PM Care of [name] transferred to [facility] and Dr. Tamera Punt at the end of my shift as the patient will require reassessment once labs/imaging have resulted. Patient presentation, ED course, and plan of care discussed with review of all pertinent labs and imaging. Please see  his/her note for further details regarding further ED course and disposition. Plan at time of handoff is probably admission for COVID-19 with new oxygen requirement but  pending results of CT Abd/Pelvis, CT head. This may be altered or completely changed at the discretion of the oncoming team pending results of further workup.  Final Clinical Impression(s) / ED Diagnoses Final diagnoses:  None    Rx / DC Orders ED Discharge Orders     None        Dorien Chihuahua 07/29/21 2004    Malvin Johns, MD 07/29/21 2324

## 2021-07-30 DIAGNOSIS — E876 Hypokalemia: Secondary | ICD-10-CM | POA: Diagnosis present

## 2021-07-30 DIAGNOSIS — J9601 Acute respiratory failure with hypoxia: Secondary | ICD-10-CM | POA: Diagnosis present

## 2021-07-30 DIAGNOSIS — F32A Depression, unspecified: Secondary | ICD-10-CM | POA: Diagnosis present

## 2021-07-30 DIAGNOSIS — R296 Repeated falls: Secondary | ICD-10-CM | POA: Diagnosis present

## 2021-07-30 DIAGNOSIS — E860 Dehydration: Secondary | ICD-10-CM | POA: Diagnosis present

## 2021-07-30 DIAGNOSIS — Z79899 Other long term (current) drug therapy: Secondary | ICD-10-CM | POA: Diagnosis not present

## 2021-07-30 DIAGNOSIS — Z87891 Personal history of nicotine dependence: Secondary | ICD-10-CM | POA: Diagnosis not present

## 2021-07-30 DIAGNOSIS — J432 Centrilobular emphysema: Secondary | ICD-10-CM | POA: Diagnosis present

## 2021-07-30 DIAGNOSIS — M810 Age-related osteoporosis without current pathological fracture: Secondary | ICD-10-CM | POA: Diagnosis present

## 2021-07-30 DIAGNOSIS — I493 Ventricular premature depolarization: Secondary | ICD-10-CM | POA: Diagnosis present

## 2021-07-30 DIAGNOSIS — E785 Hyperlipidemia, unspecified: Secondary | ICD-10-CM | POA: Diagnosis present

## 2021-07-30 DIAGNOSIS — I10 Essential (primary) hypertension: Secondary | ICD-10-CM | POA: Diagnosis present

## 2021-07-30 DIAGNOSIS — K219 Gastro-esophageal reflux disease without esophagitis: Secondary | ICD-10-CM | POA: Diagnosis present

## 2021-07-30 DIAGNOSIS — Z90711 Acquired absence of uterus with remaining cervical stump: Secondary | ICD-10-CM | POA: Diagnosis not present

## 2021-07-30 DIAGNOSIS — U071 COVID-19: Principal | ICD-10-CM

## 2021-07-30 DIAGNOSIS — F0390 Unspecified dementia without behavioral disturbance: Secondary | ICD-10-CM | POA: Diagnosis present

## 2021-07-30 LAB — BASIC METABOLIC PANEL
Anion gap: 8 (ref 5–15)
BUN: 13 mg/dL (ref 8–23)
CO2: 27 mmol/L (ref 22–32)
Calcium: 8.2 mg/dL — ABNORMAL LOW (ref 8.9–10.3)
Chloride: 101 mmol/L (ref 98–111)
Creatinine, Ser: 0.58 mg/dL (ref 0.44–1.00)
GFR, Estimated: >60 mL/min (ref >60)
Glucose, Bld: 98 mg/dL (ref 70–99)
Potassium: 3.6 mmol/L (ref 3.5–5.1)
Sodium: 136 mmol/L (ref 135–145)

## 2021-07-30 MED ORDER — APIXABAN 2.5 MG PO TABS
2.5000 mg | ORAL_TABLET | Freq: Two times a day (BID) | ORAL | Status: DC
  Administered 2021-07-31 – 2021-08-02 (×6): 2.5 mg via ORAL
  Filled 2021-07-30 (×6): qty 1

## 2021-07-30 MED ORDER — ACETAMINOPHEN 325 MG PO TABS: 650.0000 mg | ORAL_TABLET | Freq: Four times a day (QID) | ORAL | Status: DC | PRN

## 2021-07-30 MED ORDER — ALBUTEROL SULFATE HFA 108 (90 BASE) MCG/ACT IN AERS
2.0000 | INHALATION_SPRAY | Freq: Four times a day (QID) | RESPIRATORY_TRACT | Status: DC
  Administered 2021-07-31 – 2021-08-02 (×8): 2 via RESPIRATORY_TRACT
  Filled 2021-07-30: qty 6.7

## 2021-07-30 MED ORDER — ONDANSETRON HCL 4 MG/2ML IJ SOLN: 4.0000 mg | Freq: Four times a day (QID) | INTRAMUSCULAR | Status: DC | PRN

## 2021-07-30 MED ORDER — SODIUM CHLORIDE 0.9 % IV SOLN: 200.0000 mg | Freq: Once | INTRAVENOUS | Status: DC

## 2021-07-30 MED ORDER — SODIUM CHLORIDE 0.9 % IV SOLN: 100.0000 mg | Freq: Every day | INTRAVENOUS | Status: DC

## 2021-07-30 MED ORDER — ENOXAPARIN SODIUM 40 MG/0.4ML IJ SOSY
40.0000 mg | PREFILLED_SYRINGE | INTRAMUSCULAR | Status: DC
  Filled 2021-07-30: qty 0.4

## 2021-07-30 MED ORDER — HYDROCOD POLST-CPM POLST ER 10-8 MG/5ML PO SUER
5.0000 mL | Freq: Two times a day (BID) | ORAL | Status: DC | PRN
Start: 2021-07-30 — End: 2021-08-02

## 2021-07-30 MED ORDER — DEXAMETHASONE 4 MG PO TABS
6.0000 mg | ORAL_TABLET | ORAL | Status: DC
  Administered 2021-07-30 – 2021-07-31 (×2): 6 mg via ORAL
  Filled 2021-07-30 (×2): qty 1

## 2021-07-30 MED ORDER — ONDANSETRON HCL 4 MG PO TABS: 4.0000 mg | ORAL_TABLET | Freq: Four times a day (QID) | ORAL | Status: DC | PRN

## 2021-07-30 MED ORDER — GUAIFENESIN-DM 100-10 MG/5ML PO SYRP: 10.0000 mL | ORAL_SOLUTION | ORAL | Status: DC | PRN

## 2021-07-30 NOTE — ED Notes (Signed)
Pt granddaughter reached and updated via phone.

## 2021-07-30 NOTE — ED Notes (Signed)
ED TO INPATIENT HANDOFF REPORT  ED Nurse Name and Phone #:   S Name/Age/Gender Amanda [name] 87 y.o. female Room/Bed: MH11/MH11  Code Status   Code Status: Prior  Home/SNF/Other Home Patient oriented to: self, place, time, and situation Is this baseline? Yes   Triage Complete: Triage complete  Chief Complaint COVID-19 virus infection [U07.1]  Triage Note Pt c/o dizziness x 5 days-fell in her home this am-states "feel sore all over"-denies head/neck injury-denies LOC-NAD-to triage in w/c-granddaughter with pt-pt alter added she has had prod cough x ~2 weeks-states she uses inhaler prn-not recently   Allergies No Known Allergies  Level of Care/Admitting Diagnosis ED Disposition     ED Disposition  Admit   Condition  --   [place]: [facility] [[id number]]  Level of Care: Med-Surg [16]  May [facility] patient to [facility] or [facility] if equivalent level of care is available:: Yes  Interfacility transfer: Yes  Covid Evaluation: Confirmed COVID Positive  Diagnosis: COVID-19 virus infection [id number]  Admitting Physician: Eben Burow [id number]  Attending Physician: Eben Burow [id number]  Estimated length of stay: past midnight tomorrow  Certification:: I certify this patient will need inpatient services for at least 2 midnights          B Medical/Surgery History Past Medical History:  Diagnosis Date   Depression    Hyperlipidemia    Hypertension    Memory disorder    Osteoporosis    PVC (premature ventricular contraction)    Past Surgical History:  Procedure Laterality Date   PARTIAL HYSTERECTOMY       A IV Location/Drains/Wounds Patient Lines/Drains/Airways Status     Active Line/Drains/Airways     Name Placement date Placement time Site Days   Peripheral IV 07/29/21 20 G 1.16" Left Antecubital 07/29/21  1844  Antecubital  1            Intake/Output Last 24 hours  Intake/Output Summary  (Last 24 hours) at 07/30/2021 1605 Last data filed at 07/30/2021 0940 Gross per 24 hour  Intake 1687.66 ml  Output --  Net 1687.66 ml    Labs/Imaging Results for orders placed or performed during the hospital encounter of 07/29/21 (from the past 48 hour(s))  Basic metabolic panel     Status: Abnormal   Collection Time: 07/29/21  3:18 PM  Result Value Ref Range   Sodium 135 135 - 145 mmol/L   Potassium 2.8 (L) 3.5 - 5.1 mmol/L   Chloride 91 (L) 98 - 111 mmol/L   CO2 33 (H) 22 - 32 mmol/L   Glucose, Bld 110 (H) 70 - 99 mg/dL    Comment: Glucose reference range applies only to samples taken after fasting for at least 8 hours.   BUN 14 8 - 23 mg/dL   Creatinine, Ser 0.78 0.44 - 1.00 mg/dL   Calcium 9.3 8.9 - 10.3 mg/dL   GFR, Estimated >60 >60 mL/min    Comment: (NOTE) Calculated using the CKD-EPI Creatinine Equation (2021)    Anion gap 11 5 - 15    Comment: Performed at [facility], [address] [place], [place] [address]  CBC     Status: None   Collection Time: 07/29/21  3:18 PM  Result Value Ref Range   WBC 6.1 4.0 - 10.5 K/uL   RBC 4.63 3.87 - 5.11 MIL/uL   Hemoglobin 14.0 12.0 - 15.0 g/dL   HCT 41.9 36.0 - 46.0 %   MCV  90.5 80.0 - 100.0 fL   MCH 30.2 26.0 - 34.0 pg   MCHC 33.4 30.0 - 36.0 g/dL   RDW 14.7 11.5 - 15.5 %   Platelets 241 150 - 400 K/uL   nRBC 0.0 0.0 - 0.2 %    Comment: Performed at [facility] [address], [address] [place], [place] [address]  Resp Panel by RT-PCR (Flu A&B, Covid) Nasopharyngeal Swab     Status: Abnormal   Collection Time: 07/29/21  3:18 PM   Specimen: Nasopharyngeal Swab; Nasopharyngeal(NP) swabs in vial transport medium  Result Value Ref Range   SARS Coronavirus 2 by RT PCR POSITIVE (A) NEGATIVE    Comment: RESULT CALLED TO, READ BACK BY AND VERIFIED WITH: Marissa S RN [id number] PHILLIPS C (NOTE) SARS-CoV-2 target nucleic acids are DETECTED.  The SARS-CoV-2 RNA is generally detectable in upper  respiratory specimens during the acute phase of infection. Positive results are indicative of the presence of the identified virus, but do not rule out bacterial infection or co-infection with other pathogens not detected by the test. Clinical correlation with patient history and other diagnostic information is necessary to determine patient infection status. The expected result is Negative.  Fact Sheet for Patients: EntrepreneurPulse.com.au  Fact Sheet for Healthcare Providers: IncredibleEmployment.be  This test is not yet approved or cleared by the [place] FDA and  has been authorized for detection and/or diagnosis of SARS-CoV-2 by FDA under an Emergency Use Authorization (EUA).  This EUA will remain in effect (meaning this test can be  used) for the duration of  the COVID-19 declaration under Section 564(b)(1) of the Act, 21 U.S.C. section 360bbb-3(b)(1), unless the authorization is terminated or revoked sooner.     Influenza A by PCR NEGATIVE NEGATIVE   Influenza B by PCR NEGATIVE NEGATIVE    Comment: (NOTE) The Xpert Xpress SARS-CoV-2/FLU/RSV plus assay is intended as an aid in the diagnosis of influenza from Nasopharyngeal swab specimens and should not be used as a sole basis for treatment. Nasal washings and aspirates are unacceptable for Xpert Xpress SARS-CoV-2/FLU/RSV testing.  Fact Sheet for Patients: EntrepreneurPulse.com.au  Fact Sheet for Healthcare Providers: IncredibleEmployment.be  This test is not yet approved or cleared by the [place] FDA and has been authorized for detection and/or diagnosis of SARS-CoV-2 by FDA under an Emergency Use Authorization (EUA). This EUA will remain in effect (meaning this test can be used) for the duration of the COVID-19 declaration under Section 564(b)(1) of the Act, 21 U.S.C. section 360bbb-3(b)(1), unless the authorization is terminated  or revoked.  Performed at [facility], [address] [place], [place] [address]   CBG monitoring, ED     Status: Abnormal   Collection Time: 07/29/21  3:34 PM  Result Value Ref Range   Glucose-Capillary 103 (H) 70 - 99 mg/dL    Comment: Glucose reference range applies only to samples taken after fasting for at least 8 hours.  Urinalysis, Routine w reflex microscopic Urine, Clean Catch     Status: None   Collection Time: 07/29/21  4:42 PM  Result Value Ref Range   Color, Urine YELLOW YELLOW   APPearance CLEAR CLEAR   Specific Gravity, Urine 1.020 1.005 - 1.030   pH 6.0 5.0 - 8.0   Glucose, UA NEGATIVE NEGATIVE mg/dL   Hgb urine dipstick NEGATIVE NEGATIVE   Bilirubin Urine NEGATIVE NEGATIVE   Ketones, ur NEGATIVE NEGATIVE mg/dL   Protein, ur NEGATIVE NEGATIVE mg/dL   Nitrite  NEGATIVE NEGATIVE   Leukocytes,Ua NEGATIVE NEGATIVE    Comment: Microscopic not done on urines with negative protein, blood, leukocytes, nitrite, or glucose < 500 mg/dL. Performed at [facility], [address] [place], [place] [address]   Magnesium     Status: None   Collection Time: 07/29/21  6:41 PM  Result Value Ref Range   Magnesium 1.8 1.7 - 2.4 mg/dL    Comment: Performed at [facility], [address] [place], [place] [address]   CT Abdomen Pelvis Wo Contrast  Result Date: 07/29/2021 CLINICAL DATA:  Abdomen pain EXAM: CT ABDOMEN AND PELVIS WITHOUT CONTRAST TECHNIQUE: Multidetector CT imaging of the abdomen and pelvis was performed following the standard protocol without IV contrast. COMPARISON:  CT 04/04/2021 FINDINGS: Lower chest: Lung bases demonstrate dependent atelectasis. No pleural effusion. Borderline cardiomegaly. Hepatobiliary: No focal liver abnormality is seen. No gallstones, gallbladder wall thickening, or biliary dilatation. Pancreas: Unremarkable. No pancreatic ductal dilatation or surrounding inflammatory changes. Spleen: Normal in size without focal  abnormality. Adrenals/Urinary Tract: Right adrenal gland is normal. 2.5 cm left adrenal adenoma. Kidneys show no hydronephrosis. The bladder is normal Stomach/Bowel: Decompressed stomach with radiodense material. Questionable proximal gastric wall thickening but stomach is decompressed. No dilated small bowel. Diffuse diverticular disease of the colon without acute wall thickening. Appendix within normal limits. Vascular/Lymphatic: Advanced aortic atherosclerosis. No aneurysm. No suspicious nodes. Reproductive: Status post hysterectomy. Dense left adnexal calcification without change Other: Negative for pelvic effusion or free air. Musculoskeletal: No acute or suspicious osseous abnormality IMPRESSION: 1. No definite CT evidence for acute intra-abdominal or pelvic abnormality. 2. Possible proximal gastric wall thickening/gastritis but stomach is decompressed 3. Diverticular disease of the colon without acute wall thickening 4. Stable left adrenal adenoma Electronically Signed   By: Donavan Foil M.D.   On: 07/29/2021 21:03   CT Head Wo Contrast  Result Date: 07/29/2021 CLINICAL DATA:  Head trauma. EXAM: CT HEAD WITHOUT CONTRAST TECHNIQUE: Contiguous axial images were obtained from the base of the skull through the vertex without intravenous contrast. COMPARISON:  Head CT dated 08/25/2020. FINDINGS: Brain: Mild age-related atrophy and chronic microvascular ischemic changes. There is no acute intracranial hemorrhage. Mass effect or midline shift. No extra-axial fluid collection. Vascular: No hyperdense vessel or unexpected calcification. Skull: Normal. Negative for fracture or focal lesion. Sinuses/Orbits: No acute finding. Other: None IMPRESSION: 1. No acute intracranial pathology. 2. Mild age-related atrophy and chronic microvascular ischemic changes. Electronically Signed   By: Anner Crete M.D.   On: 07/29/2021 20:55   DG Chest Portable 1 View  Result Date: 07/29/2021 CLINICAL DATA:  Cough and dizziness.  EXAM: PORTABLE CHEST 1 VIEW COMPARISON:  Chest x-ray dated January 18, 2021. FINDINGS: Unchanged mild cardiomegaly. Chronic pulmonary vascular congestion. No focal consolidation, pleural effusion, or pneumothorax. No acute osseous abnormality. IMPRESSION: No active disease. Electronically Signed   By: Titus Dubin M.D.   On: 07/29/2021 18:48    Pending Labs Unresulted Labs (From admission, onward)    None       Vitals/Pain Today's Vitals   07/30/21 1145 07/30/21 1250 07/30/21 1300 07/30/21 1315  BP: 123/69  128/72 135/79  Pulse: 60 (!) 58 (!) 59 (!) 57  Resp: 20   18  Temp:      TempSrc:      SpO2: 93%  91% 93%  Weight:      Height:      PainSc:        Isolation Precautions  Airborne and Contact precautions  Medications Medications  remdesivir 100 mg in sodium chloride 0.9 % 100 mL IVPB (0 mg Intravenous Stopped 07/30/21 0225)    Followed by  remdesivir 100 mg in sodium chloride 0.9 % 100 mL IVPB (0 mg Intravenous Stopped 07/30/21 0940)  lactated ringers bolus 1,000 mL ( Intravenous Stopped 07/29/21 2054)  potassium chloride 10 mEq in 100 mL IVPB (0 mEq Intravenous Stopped 07/30/21 0027)  potassium chloride SA (KLOR-CON) CR tablet 40 mEq (40 mEq Oral Given 07/29/21 1910)  methylPREDNISolone sodium succinate (SOLU-MEDROL) 125 mg/2 mL injection 60 mg (60 mg Intravenous Given 07/30/21 0041)    Mobility walks Moderate fall risk   Focused Assessments Pulmonary Assessment Handoff:  Lung sounds:   O2 Device: Nasal Cannula O2 Flow Rate (L/min): 2 L/min    R Recommendations: See Admitting Provider Note  Report given to:   Additional Notes:

## 2021-07-30 NOTE — ED Notes (Signed)
Pt transported to [facility] via [facility]

## 2021-07-30 NOTE — ED Notes (Signed)
Attempted to call granddaughter to give update, no answer when called.  [place].

## 2021-07-30 NOTE — ED Notes (Signed)
Marquita called and updated on pt bed at [facility], she advised she would tell the rest of the family.

## 2021-07-30 NOTE — H&P (Signed)
History and Physical   Amanda [name] [id number] DOB: 08/03/1933 DOA: 07/29/2021  Referring MD/NP/PA: Delia Heady, PA.  PCP: Caren Macadam, MD   Outpatient Specialists: None  Patient coming from: [place]  Chief Complaint: Cold-like symptoms  HPI: Amanda [name] is a 87 y.o. female with medical history significant of essential hypertension, hyperlipidemia, dementia, recurrent falls who presented to the ER with dizziness and fall from home.  Also having respiratory symptoms with productive cough for almost 2 weeks.  Patient has history of reactive airway disease for which she uses inhalers.  She was symptomatic but no fever.  Denied any head injury.  Patient denied any nausea vomiting or diarrhea.  She was seen in the ER and appears weak and debilitated.  Work-up performed showed patient has positive COVID-19 infection.  She was also relatively hypoxic.  Oxygen sat was in the low 90s.  2 L of oxygen was placed to keep her oxygen sats above 92%.  With symptomatic COVID-19 patient was initiated on dexamethasone and remdesivir and then transferred for inpatient care.  She denied any chest pain now.  Denied any nausea vomiting or diarrhea at the moment.  She appears comfortable with no respiratory distress..  ED Course: Temperature is 99.3, blood pressure 182/73, pulse 118, respiratory rate of 20, oxygen sats 89% on room air and 100% on 2 L.  COVID-19 screen is positive.  Sodium 136 potassium 3.6 chloride 101 CO2 27 glucose 98.  BUN is 13 and calcium 8.2.  Urinalysis essentially negative.  Chest x-ray showed no active disease.  CT abdomen pelvis showed no intra-abdominal or pelvic abnormalities.  Diverticular disease of stable left adrenal adenoma.  Head CT without contrast also negative.  Patient admitted to the hospital for symptomatic COVID-19 infection.  Review of Systems: As per HPI otherwise 10 point review of systems negative.    Past Medical History:  Diagnosis Date    Depression    Hyperlipidemia    Hypertension    Memory disorder    Osteoporosis    PVC (premature ventricular contraction)     Past Surgical History:  Procedure Laterality Date   PARTIAL HYSTERECTOMY       reports that she quit smoking about 49 years ago. Her smoking use included cigarettes. She started smoking about 66 years ago. She has never used smokeless tobacco. She reports that she does not drink alcohol and does not use drugs.  No Known Allergies  Family History  Problem Relation Age of Onset   Lung cancer Neg Hx      Prior to Admission medications   Medication Sig Start Date End Date Taking? Authorizing Provider  albuterol (PROVENTIL) (2.5 MG/3ML) 0.083% nebulizer solution Take 3 mLs (2.5 mg total) by nebulization every 6 (six) hours as needed for wheezing or shortness of breath. 03/23/21   Spero Geralds, MD  albuterol (VENTOLIN HFA) 108 (90 Base) MCG/ACT inhaler INHALE TWO PUFFS BY MOUTH INTO LUNGS every SIX hours AS NEEDED Patient taking differently: Inhale 2 puffs into the lungs every 6 (six) hours as needed for wheezing or shortness of breath. 02/18/21   Spero Geralds, MD  amLODipine (NORVASC) 10 MG tablet Take 10 mg by mouth daily.    [provider]  citalopram (CELEXA) 20 MG tablet Take 20 mg by mouth daily.    [provider]  donepezil (ARICEPT) 10 MG tablet Take 10 mg by mouth at bedtime. 09/07/20   [provider]  ferrous sulfate 325 (65 FE) MG  tablet Take 325 mg by mouth 2 (two) times daily. 02/16/21   [provider]  hydrochlorothiazide (HYDRODIURIL) 25 MG tablet Take 25 mg by mouth every morning. 07/19/21   [provider]  memantine (NAMENDA) 10 MG tablet Take 1 tablet (10 mg total) by mouth 2 (two) times daily. 05/16/21   Star Age, MD  Methylcobalamin (B12-ACTIVE) 1 MG CHEW 1 tablet    [provider]  oxyCODONE-acetaminophen (PERCOCET/ROXICET) 5-325 MG tablet Take 1 tablet by mouth every 6 (six)  hours as needed for severe pain.    [provider]  pantoprazole (PROTONIX) 40 MG tablet Take 40 mg by mouth 2 (two) times daily.    [provider]  vitamin B-12 (CYANOCOBALAMIN) 1000 MCG tablet Take 1,000 mcg by mouth daily.     [provider]    Physical Exam: Vitals:   07/30/21 2045 07/30/21 2115 07/30/21 2130 07/30/21 2212  BP: (!) 157/94 (!) 168/79  (!) 164/71  Pulse: (!) 58   (!) 52  Resp: 17     Temp:   98.4 F (36.9 C) 98.4 F (36.9 C)  TempSrc:   Oral Oral  SpO2: 96% 97%  100%  Weight:      Height:          Constitutional: Chronically ill looking, weak, no distress Vitals:   07/30/21 2045 07/30/21 2115 07/30/21 2130 07/30/21 2212  BP: (!) 157/94 (!) 168/79  (!) 164/71  Pulse: (!) 58   (!) 52  Resp: 17     Temp:   98.4 F (36.9 C) 98.4 F (36.9 C)  TempSrc:   Oral Oral  SpO2: 96% 97%  100%  Weight:      Height:       Eyes: PERRL, lids and conjunctivae normal ENMT: Mucous membranes are dry. Posterior pharynx clear of any exudate or lesions.Normal dentition.  Neck: normal, supple, no masses, no thyromegaly Respiratory: Coarse breath sounds bilaterally bilaterally, no wheezing, no crackles. Normal respiratory effort. No accessory muscle use.  Cardiovascular: Sinus bradycardia no murmurs / rubs / gallops. No extremity edema. 2+ pedal pulses. No carotid bruits.  Abdomen: no tenderness, no masses palpated. No hepatosplenomegaly. Bowel sounds positive.  Musculoskeletal: no clubbing / cyanosis. No joint deformity upper and lower extremities. Good ROM, no contractures. Normal muscle tone.  Skin: no rashes, lesions, ulcers. No induration Neurologic: CN 2-12 grossly intact. Sensation intact, DTR normal. Strength 5/5 in all 4.  Psychiatric: Awake and alert, no obvious confusion,    Labs on Admission: I have personally reviewed following labs and imaging studies  CBC: Recent Labs  Lab 07/29/21 1518  WBC 6.1  HGB 14.0  HCT 41.9  MCV  90.5  PLT [id number]   Basic Metabolic Panel: Recent Labs  Lab 07/29/21 1518 07/29/21 1841 07/30/21 1610  NA 135  --  136  K 2.8*  --  3.6  CL 91*  --  101  CO2 33*  --  27  GLUCOSE 110*  --  98  BUN 14  --  13  CREATININE 0.78  --  0.58  CALCIUM 9.3  --  8.2*  MG  --  1.8  --    GFR: Estimated Creatinine Clearance: 38.1 mL/min (by C-G formula based on SCr of 0.58 mg/dL). Liver Function Tests: No results for input(s): AST, ALT, ALKPHOS, BILITOT, PROT, ALBUMIN in the last 168 hours. No results for input(s): LIPASE, AMYLASE in the last 168 hours. No results for input(s): AMMONIA in the last 168  hours. Coagulation Profile: No results for input(s): INR, PROTIME in the last 168 hours. Cardiac Enzymes: No results for input(s): CKTOTAL, CKMB, CKMBINDEX, TROPONINI in the last 168 hours. BNP (last 3 results) No results for input(s): PROBNP in the last 8760 hours. HbA1C: No results for input(s): HGBA1C in the last 72 hours. CBG: Recent Labs  Lab 07/29/21 1534  GLUCAP 103*   Lipid Profile: No results for input(s): CHOL, HDL, LDLCALC, TRIG, CHOLHDL, LDLDIRECT in the last 72 hours. Thyroid Function Tests: No results for input(s): TSH, T4TOTAL, FREET4, T3FREE, THYROIDAB in the last 72 hours. Anemia Panel: No results for input(s): VITAMINB12, FOLATE, FERRITIN, TIBC, IRON, RETICCTPCT in the last 72 hours. Urine analysis:    Component Value Date/Time   COLORURINE YELLOW 07/29/2021 1642   APPEARANCEUR CLEAR 07/29/2021 1642   LABSPEC 1.020 07/29/2021 1642   PHURINE 6.0 07/29/2021 1642   GLUCOSEU NEGATIVE 07/29/2021 1642   HGBUR NEGATIVE 07/29/2021 1642   BILIRUBINUR NEGATIVE 07/29/2021 1642   BILIRUBINUR n 12/28/2015 1438   KETONESUR NEGATIVE 07/29/2021 1642   PROTEINUR NEGATIVE 07/29/2021 1642   UROBILINOGEN 0.2 12/28/2015 1438   UROBILINOGEN 0.2 08/22/2013 1525   NITRITE NEGATIVE 07/29/2021 1642   LEUKOCYTESUR NEGATIVE 07/29/2021 1642   Sepsis  Labs: '@LABRCNTIP'$ (procalcitonin:4,lacticidven:4) ) Recent Results (from the past 240 hour(s))  Resp Panel by RT-PCR (Flu A&B, Covid) Nasopharyngeal Swab     Status: Abnormal   Collection Time: 07/29/21  3:18 PM   Specimen: Nasopharyngeal Swab; Nasopharyngeal(NP) swabs in vial transport medium  Result Value Ref Range Status   SARS Coronavirus 2 by RT PCR POSITIVE (A) NEGATIVE Final    Comment: RESULT CALLED TO, READ BACK BY AND VERIFIED WITH: [place] [id number] PHILLIPS C (NOTE) SARS-CoV-2 target nucleic acids are DETECTED.  The SARS-CoV-2 RNA is generally detectable in upper respiratory specimens during the acute phase of infection. Positive results are indicative of the presence of the identified virus, but do not rule out bacterial infection or co-infection with other pathogens not detected by the test. Clinical correlation with patient history and other diagnostic information is necessary to determine patient infection status. The expected result is Negative.  Fact Sheet for Patients: EntrepreneurPulse.com.au  Fact Sheet for Healthcare Providers: IncredibleEmployment.be  This test is not yet approved or cleared by the [place] FDA and  has been authorized for detection and/or diagnosis of SARS-CoV-2 by FDA under an Emergency Use Authorization (EUA).  This EUA will remain in effect (meaning this test can be  used) for the duration of  the COVID-19 declaration under Section 564(b)(1) of the Act, 21 U.S.C. section 360bbb-3(b)(1), unless the authorization is terminated or revoked sooner.     Influenza A by PCR NEGATIVE NEGATIVE Final   Influenza B by PCR NEGATIVE NEGATIVE Final    Comment: (NOTE) The Xpert Xpress SARS-CoV-2/FLU/RSV plus assay is intended as an aid in the diagnosis of influenza from Nasopharyngeal swab specimens and should not be used as a sole basis for treatment. Nasal washings and aspirates are unacceptable for  Xpert Xpress SARS-CoV-2/FLU/RSV testing.  Fact Sheet for Patients: EntrepreneurPulse.com.au  Fact Sheet for Healthcare Providers: IncredibleEmployment.be  This test is not yet approved or cleared by the [place] FDA and has been authorized for detection and/or diagnosis of SARS-CoV-2 by FDA under an Emergency Use Authorization (EUA). This EUA will remain in effect (meaning this test can be used) for the duration of the COVID-19 declaration under Section 564(b)(1) of the Act, 21 U.S.C. section 360bbb-3(b)(1), unless the authorization  is terminated or revoked.  Performed at [facility], [address] [place], [place] [address]      Radiological Exams on Admission: CT Abdomen Pelvis Wo Contrast  Result Date: 07/29/2021 CLINICAL DATA:  Abdomen pain EXAM: CT ABDOMEN AND PELVIS WITHOUT CONTRAST TECHNIQUE: Multidetector CT imaging of the abdomen and pelvis was performed following the standard protocol without IV contrast. COMPARISON:  CT 04/04/2021 FINDINGS: Lower chest: Lung bases demonstrate dependent atelectasis. No pleural effusion. Borderline cardiomegaly. Hepatobiliary: No focal liver abnormality is seen. No gallstones, gallbladder wall thickening, or biliary dilatation. Pancreas: Unremarkable. No pancreatic ductal dilatation or surrounding inflammatory changes. Spleen: Normal in size without focal abnormality. Adrenals/Urinary Tract: Right adrenal gland is normal. 2.5 cm left adrenal adenoma. Kidneys show no hydronephrosis. The bladder is normal Stomach/Bowel: Decompressed stomach with radiodense material. Questionable proximal gastric wall thickening but stomach is decompressed. No dilated small bowel. Diffuse diverticular disease of the colon without acute wall thickening. Appendix within normal limits. Vascular/Lymphatic: Advanced aortic atherosclerosis. No aneurysm. No suspicious nodes. Reproductive: Status post hysterectomy. Dense  left adnexal calcification without change Other: Negative for pelvic effusion or free air. Musculoskeletal: No acute or suspicious osseous abnormality IMPRESSION: 1. No definite CT evidence for acute intra-abdominal or pelvic abnormality. 2. Possible proximal gastric wall thickening/gastritis but stomach is decompressed 3. Diverticular disease of the colon without acute wall thickening 4. Stable left adrenal adenoma Electronically Signed   By: Donavan Foil M.D.   On: 07/29/2021 21:03   CT Head Wo Contrast  Result Date: 07/29/2021 CLINICAL DATA:  Head trauma. EXAM: CT HEAD WITHOUT CONTRAST TECHNIQUE: Contiguous axial images were obtained from the base of the skull through the vertex without intravenous contrast. COMPARISON:  Head CT dated 08/25/2020. FINDINGS: Brain: Mild age-related atrophy and chronic microvascular ischemic changes. There is no acute intracranial hemorrhage. Mass effect or midline shift. No extra-axial fluid collection. Vascular: No hyperdense vessel or unexpected calcification. Skull: Normal. Negative for fracture or focal lesion. Sinuses/Orbits: No acute finding. Other: None IMPRESSION: 1. No acute intracranial pathology. 2. Mild age-related atrophy and chronic microvascular ischemic changes. Electronically Signed   By: Anner Crete M.D.   On: 07/29/2021 20:55   DG Chest Portable 1 View  Result Date: 07/29/2021 CLINICAL DATA:  Cough and dizziness. EXAM: PORTABLE CHEST 1 VIEW COMPARISON:  Chest x-ray dated January 18, 2021. FINDINGS: Unchanged mild cardiomegaly. Chronic pulmonary vascular congestion. No focal consolidation, pleural effusion, or pneumothorax. No acute osseous abnormality. IMPRESSION: No active disease. Electronically Signed   By: Titus Dubin M.D.   On: 07/29/2021 18:48    EKG: Independently reviewed.  Normal sinus rhythm  Assessment/Plan Principal Problem:   COVID-19 virus infection Active Problems:   HTN (hypertension)   Hyperlipidemia   Centrilobular  emphysema (HCC)   Dementia (HCC)   Recurrent major depression in remission ([place])     #1 symptomatic COVID-19 infection: Patient has hypoxia.  No obvious pneumonia on x-ray.  Admit the patient.  Continue with dexamethasone and remdesivir.  Supportive care.  Titrate off oxygen.  Continue inhalers.  Follow COVID-19 protocol for treatment.  #2 essential hypertension: Patient reportedly takes hydrochlorothiazide.  Confirm home regimen.  This includes amlodipine likely.  We will continue home regimen.  #3 dementia: Continue Namenda if confirmed.  Also Aricept.  #4 GERD: We will continue PPIs.  #5 depression: Was on Celexa.  Continue if confirmed.  #6 COPD: Empirically on inhalers.  Continue in the hospital.  #7 dehydration: Mildly hydrated.   DVT prophylaxis:  Eliquis.  Patient is afraid of needles Code Status: Full code Family Communication: No family at bedside Disposition Plan: Home Consults called: None Admission status: Observation  Severity of Illness: The appropriate patient status for this patient is OBSERVATION. Observation status is judged to be reasonable and necessary in order to provide the required intensity of service to ensure the patient's safety. The patient's presenting symptoms, physical exam findings, and initial radiographic and laboratory data in the context of their medical condition is felt to place them at decreased risk for further clinical deterioration. Furthermore, it is anticipated that the patient will be medically stable for discharge from the hospital within 2 midnights of admission. The following factors support the patient status of observation.   " The patient's presenting symptoms include weakness and shortness of breath. " The physical exam findings include dry mucous membranes and confusion. " The initial radiographic and laboratory data are COVID-19 positive.   Barbette Merino MD [facility] Pager [phone]  If 7PM-7AM, please contact  night-coverage [email or web address] Password [facility]  07/30/2021, 10:22 PM

## 2021-07-31 ENCOUNTER — Encounter (HOSPITAL_COMMUNITY): Payer: Self-pay | Admitting: Family Medicine

## 2021-07-31 DIAGNOSIS — I10 Essential (primary) hypertension: Secondary | ICD-10-CM

## 2021-07-31 LAB — CBC WITH DIFFERENTIAL/PLATELET
Abs Immature Granulocytes: 0.01 K/uL (ref 0.00–0.07)
Basophils Absolute: 0 K/uL (ref 0.0–0.1)
Basophils Relative: 0 %
Eosinophils Absolute: 0 K/uL (ref 0.0–0.5)
Eosinophils Relative: 0 %
HCT: 40 % (ref 36.0–46.0)
Hemoglobin: 13.2 g/dL (ref 12.0–15.0)
Immature Granulocytes: 0 %
Lymphocytes Relative: 21 %
Lymphs Abs: 0.7 K/uL (ref 0.7–4.0)
MCH: 30.1 pg (ref 26.0–34.0)
MCHC: 33 g/dL (ref 30.0–36.0)
MCV: 91.1 fL (ref 80.0–100.0)
Monocytes Absolute: 0.2 K/uL (ref 0.1–1.0)
Monocytes Relative: 6 %
Neutro Abs: 2.5 K/uL (ref 1.7–7.7)
Neutrophils Relative %: 73 %
Platelets: 246 K/uL (ref 150–400)
RBC: 4.39 MIL/uL (ref 3.87–5.11)
RDW: 14.6 % (ref 11.5–15.5)
WBC: 3.4 K/uL — ABNORMAL LOW (ref 4.0–10.5)
nRBC: 0 % (ref 0.0–0.2)

## 2021-07-31 LAB — COMPREHENSIVE METABOLIC PANEL
ALT: 15 U/L (ref 0–44)
AST: 22 U/L (ref 15–41)
Albumin: 3.5 g/dL (ref 3.5–5.0)
Alkaline Phosphatase: 37 U/L — ABNORMAL LOW (ref 38–126)
Anion gap: 9 (ref 5–15)
BUN: 14 mg/dL (ref 8–23)
CO2: 32 mmol/L (ref 22–32)
Calcium: 9.1 mg/dL (ref 8.9–10.3)
Chloride: 97 mmol/L — ABNORMAL LOW (ref 98–111)
Creatinine, Ser: 0.58 mg/dL (ref 0.44–1.00)
GFR, Estimated: >60 mL/min (ref >60)
Glucose, Bld: 109 mg/dL — ABNORMAL HIGH (ref 70–99)
Potassium: 3.6 mmol/L (ref 3.5–5.1)
Sodium: 138 mmol/L (ref 135–145)
Total Bilirubin: 0.6 mg/dL (ref 0.3–1.2)
Total Protein: 7.1 g/dL (ref 6.5–8.1)

## 2021-07-31 LAB — FERRITIN: Ferritin: 250 ng/mL (ref 11–307)

## 2021-07-31 LAB — MAGNESIUM: Magnesium: 1.7 mg/dL (ref 1.7–2.4)

## 2021-07-31 LAB — D-DIMER, QUANTITATIVE: D-Dimer, Quant: 2.15 ug{FEU}/mL — ABNORMAL HIGH (ref 0.00–0.50)

## 2021-07-31 LAB — C-REACTIVE PROTEIN: CRP: 2 mg/dL — ABNORMAL HIGH (ref <1.0)

## 2021-07-31 MED ORDER — HYDROCHLOROTHIAZIDE 25 MG PO TABS
25.0000 mg | ORAL_TABLET | Freq: Every morning | ORAL | Status: DC
  Administered 2021-08-01 – 2021-08-02 (×2): 25 mg via ORAL
  Filled 2021-07-31: qty 1

## 2021-07-31 MED ORDER — MEMANTINE HCL 10 MG PO TABS
10.0000 mg | ORAL_TABLET | Freq: Two times a day (BID) | ORAL | Status: DC
  Administered 2021-07-31 – 2021-08-02 (×4): 10 mg via ORAL
  Filled 2021-07-31 (×4): qty 1

## 2021-07-31 MED ORDER — VITAMIN B-12 1000 MCG PO TABS
1000.0000 ug | ORAL_TABLET | Freq: Every day | ORAL | Status: DC
  Administered 2021-07-31 – 2021-08-02 (×3): 1000 ug via ORAL
  Filled 2021-07-31 (×3): qty 1

## 2021-07-31 MED ORDER — DONEPEZIL HCL 10 MG PO TABS
10.0000 mg | ORAL_TABLET | Freq: Every day | ORAL | Status: DC
  Administered 2021-07-31 – 2021-08-01 (×2): 10 mg via ORAL
  Filled 2021-07-31 (×2): qty 1

## 2021-07-31 MED ORDER — ORAL CARE MOUTH RINSE
15.0000 mL | Freq: Two times a day (BID) | OROMUCOSAL | Status: DC
  Administered 2021-07-31 – 2021-08-02 (×3): 15 mL via OROMUCOSAL

## 2021-07-31 MED ORDER — PANTOPRAZOLE SODIUM 40 MG PO TBEC
40.0000 mg | DELAYED_RELEASE_TABLET | Freq: Two times a day (BID) | ORAL | Status: DC
  Administered 2021-07-31 – 2021-08-02 (×4): 40 mg via ORAL
  Filled 2021-07-31 (×4): qty 1

## 2021-07-31 MED ORDER — OXYCODONE-ACETAMINOPHEN 5-325 MG PO TABS: 1.0000 | ORAL_TABLET | Freq: Four times a day (QID) | ORAL | Status: DC | PRN

## 2021-07-31 MED ORDER — CITALOPRAM HYDROBROMIDE 20 MG PO TABS
20.0000 mg | ORAL_TABLET | Freq: Every day | ORAL | Status: DC
  Administered 2021-07-31 – 2021-08-02 (×3): 20 mg via ORAL
  Filled 2021-07-31 (×3): qty 1

## 2021-07-31 MED ORDER — FERROUS SULFATE 325 (65 FE) MG PO TABS
325.0000 mg | ORAL_TABLET | Freq: Every day | ORAL | Status: DC
  Administered 2021-08-01 – 2021-08-02 (×2): 325 mg via ORAL
  Filled 2021-07-31 (×2): qty 1

## 2021-07-31 MED ORDER — ALBUTEROL SULFATE (2.5 MG/3ML) 0.083% IN NEBU: 2.5000 mg | INHALATION_SOLUTION | Freq: Four times a day (QID) | RESPIRATORY_TRACT | Status: DC | PRN

## 2021-07-31 MED ORDER — AMLODIPINE BESYLATE 10 MG PO TABS
10.0000 mg | ORAL_TABLET | Freq: Every day | ORAL | Status: DC
  Administered 2021-07-31 – 2021-08-02 (×3): 10 mg via ORAL
  Filled 2021-07-31 (×3): qty 1

## 2021-07-31 NOTE — Progress Notes (Signed)
Progress Note    Amanda [name]  [id number] DOB: 08/03/1933  DOA: 07/29/2021 PCP: Caren Macadam, MD      Brief Narrative:    Medical records reviewed and are as summarized below:  Amanda [name] is a 87 y.o. female with medical history significant for hypertension, hyperlipidemia, reactive airway disease, dementia, recurrent falls, who presented to the hospital because of productive cough, dizziness and a fall at home.  She tested positive for COVID-19 infection.  She was admitted to the hospital for [id number] infection complicated by acute hypoxic respiratory failure with oxygen saturation (oxygen saturation was as low as 88%).  Chest x-ray did not show any acute abnormality.  She was treated with IV remdesivir and IV dexamethasone.      Assessment/Plan:   Principal Problem:   COVID-19 virus infection Active Problems:   HTN (hypertension)   Hyperlipidemia   Centrilobular emphysema (HCC)   Dementia (HCC)   Recurrent major depression in remission ([place])    Body mass index is 21.54 kg/m.  COVID-19 viral infection: Continue IV remdesivir and IV dexamethasone.  Acute hypoxic respiratory failure: Continue 2 L/min oxygen via nasal cannula and taper off oxygen as able.  COPD: Compensated.  Continue bronchodilators as needed.  Hypertension: Continue antihypertensives  Dementia: Continue Namenda and Aricept.  Generalized weakness, multiple falls at home: Consult PT and OT.     Diet Order             Diet Heart Room service appropriate? Yes; Fluid consistency: Thin  Diet effective now                      Consultants: None  Procedures: None    Medications:    albuterol  2 puff Inhalation Q6H   amLODipine  10 mg Oral Daily   apixaban  2.5 mg Oral BID   citalopram  20 mg Oral Daily   dexamethasone  6 mg Oral Q24H   donepezil  10 mg Oral QHS   [START ON 08/01/2021] ferrous sulfate  325 mg Oral Q breakfast   [START ON 08/01/2021]  hydrochlorothiazide  25 mg Oral q morning   memantine  10 mg Oral BID   pantoprazole  40 mg Oral BID   vitamin B-12  1,000 mcg Oral Daily   Continuous Infusions:  remdesivir 100 mg in NS 100 mL 100 mg (07/31/21 1007)     Anti-infectives (From admission, onward)    Start     Dose/Rate Route Frequency Ordered Stop   07/31/21 1000  remdesivir 100 mg in sodium chloride 0.9 % 100 mL IVPB  Status:  Discontinued       See Hyperspace for full Linked Orders Report.   100 mg 200 mL/hr over 30 Minutes Intravenous Daily 07/30/21 2222 07/30/21 2227   07/30/21 2315  remdesivir 200 mg in sodium chloride 0.9% 250 mL IVPB  Status:  Discontinued       See Hyperspace for full Linked Orders Report.   200 mg 580 mL/hr over 30 Minutes Intravenous Once 07/30/21 2222 07/30/21 2227   07/30/21 1000  remdesivir 100 mg in sodium chloride 0.9 % 100 mL IVPB       See Hyperspace for full Linked Orders Report.   100 mg 200 mL/hr over 30 Minutes Intravenous Daily 07/29/21 2351 08/03/21 0959   07/30/21 0000  remdesivir 100 mg in sodium chloride 0.9 % 100 mL IVPB       See Hyperspace for full Linked Orders Report.  100 mg 200 mL/hr over 30 Minutes Intravenous Every 1 hr x 2 07/29/21 2351 07/30/21 0225              Family Communication/Anticipated D/C date and plan/Code Status   DVT prophylaxis: apixaban (ELIQUIS) tablet 2.5 mg Start: 07/31/21 0015 apixaban (ELIQUIS) tablet 2.5 mg     Code Status: Full Code  Family Communication: None Disposition Plan:    Status is: Inpatient  Remains inpatient appropriate because:IV treatments appropriate due to intensity of illness or inability to take PO and Inpatient level of care appropriate due to severity of illness  Dispo: The patient is from: Home              Anticipated d/c is to: Home              Patient currently is not medically stable to d/c.   Difficult to place patient No           Subjective:   C/o generalized weakness and  shortness of breath with mild activity  Objective:    Vitals:   07/31/21 0845 07/31/21 0849 07/31/21 0959 07/31/21 1400  BP:   (!) 148/74   Pulse:   60   Resp:   (!) 22   Temp:   97.9 F (36.6 C)   TempSrc:   Oral   SpO2: 90% 93% 98% 94%  Weight:      Height:       No data found.   Intake/Output Summary (Last 24 hours) at 07/31/2021 1502 Last data filed at 07/31/2021 1020 Gross per 24 hour  Intake 650 ml  Output --  Net 650 ml   Filed Weights   07/30/21 0745  Weight: 51.7 kg    Exam:  GEN: NAD SKIN: No rash EYES: EOMI ENT: MMM CV: RRR PULM: CTA B ABD: soft, ND, NT, +BS CNS: AAO x 3, non focal EXT: No edema or tenderness        Data Reviewed:   I have personally reviewed following labs and imaging studies:  Labs: Labs show the following:   Basic Metabolic Panel: Recent Labs  Lab 07/29/21 1518 07/29/21 1841 07/30/21 1610 07/31/21 0433  NA 135  --  136 138  K 2.8*  --  3.6 3.6  CL 91*  --  101 97*  CO2 33*  --  27 32  GLUCOSE 110*  --  98 109*  BUN 14  --  13 14  CREATININE 0.78  --  0.58 0.58  CALCIUM 9.3  --  8.2* 9.1  MG  --  1.8  --  1.7   GFR Estimated Creatinine Clearance: 38.1 mL/min (by C-G formula based on SCr of 0.58 mg/dL). Liver Function Tests: Recent Labs  Lab 07/31/21 0433  AST 22  ALT 15  ALKPHOS 37*  BILITOT 0.6  PROT 7.1  ALBUMIN 3.5   No results for input(s): LIPASE, AMYLASE in the last 168 hours. No results for input(s): AMMONIA in the last 168 hours. Coagulation profile No results for input(s): INR, PROTIME in the last 168 hours.  CBC: Recent Labs  Lab 07/29/21 1518 07/31/21 0433  WBC 6.1 3.4*  NEUTROABS  --  2.5  HGB 14.0 13.2  HCT 41.9 40.0  MCV 90.5 91.1  PLT 241 246   Cardiac Enzymes: No results for input(s): CKTOTAL, CKMB, CKMBINDEX, TROPONINI in the last 168 hours. BNP (last 3 results) No results for input(s): PROBNP in the last 8760 hours. CBG: Recent Labs  Lab  07/29/21 1534  GLUCAP  103*   D-Dimer: Recent Labs    07/31/21 0433  DDIMER 2.15*   Hgb A1c: No results for input(s): HGBA1C in the last 72 hours. Lipid Profile: No results for input(s): CHOL, HDL, LDLCALC, TRIG, CHOLHDL, LDLDIRECT in the last 72 hours. Thyroid function studies: No results for input(s): TSH, T4TOTAL, T3FREE, THYROIDAB in the last 72 hours.  Invalid input(s): FREET3 Anemia work up: Recent Labs    07/31/21 0433  FERRITIN 250   Sepsis Labs: Recent Labs  Lab 07/29/21 1518 07/31/21 0433  WBC 6.1 3.4*    Microbiology Recent Results (from the past 240 hour(s))  Resp Panel by RT-PCR (Flu A&B, Covid) Nasopharyngeal Swab     Status: Abnormal   Collection Time: 07/29/21  3:18 PM   Specimen: Nasopharyngeal Swab; Nasopharyngeal(NP) swabs in vial transport medium  Result Value Ref Range Status   SARS Coronavirus 2 by RT PCR POSITIVE (A) NEGATIVE Final    Comment: RESULT CALLED TO, READ BACK BY AND VERIFIED WITH: Rio Dell [id number] PHILLIPS C (NOTE) SARS-CoV-2 target nucleic acids are DETECTED.  The SARS-CoV-2 RNA is generally detectable in upper respiratory specimens during the acute phase of infection. Positive results are indicative of the presence of the identified virus, but do not rule out bacterial infection or co-infection with other pathogens not detected by the test. Clinical correlation with patient history and other diagnostic information is necessary to determine patient infection status. The expected result is Negative.  Fact Sheet for Patients: EntrepreneurPulse.com.au  Fact Sheet for Healthcare Providers: IncredibleEmployment.be  This test is not yet approved or cleared by the [place] FDA and  has been authorized for detection and/or diagnosis of SARS-CoV-2 by FDA under an Emergency Use Authorization (EUA).  This EUA will remain in effect (meaning this test can be  used) for the duration of  the COVID-19 declaration  under Section 564(b)(1) of the Act, 21 U.S.C. section 360bbb-3(b)(1), unless the authorization is terminated or revoked sooner.     Influenza A by PCR NEGATIVE NEGATIVE Final   Influenza B by PCR NEGATIVE NEGATIVE Final    Comment: (NOTE) The Xpert Xpress SARS-CoV-2/FLU/RSV plus assay is intended as an aid in the diagnosis of influenza from Nasopharyngeal swab specimens and should not be used as a sole basis for treatment. Nasal washings and aspirates are unacceptable for Xpert Xpress SARS-CoV-2/FLU/RSV testing.  Fact Sheet for Patients: EntrepreneurPulse.com.au  Fact Sheet for Healthcare Providers: IncredibleEmployment.be  This test is not yet approved or cleared by the [place] FDA and has been authorized for detection and/or diagnosis of SARS-CoV-2 by FDA under an Emergency Use Authorization (EUA). This EUA will remain in effect (meaning this test can be used) for the duration of the COVID-19 declaration under Section 564(b)(1) of the Act, 21 U.S.C. section 360bbb-3(b)(1), unless the authorization is terminated or revoked.  Performed at [facility], [address] [place], [place] [address]     Procedures and diagnostic studies:  CT Abdomen Pelvis Wo Contrast  Result Date: 07/29/2021 CLINICAL DATA:  Abdomen pain EXAM: CT ABDOMEN AND PELVIS WITHOUT CONTRAST TECHNIQUE: Multidetector CT imaging of the abdomen and pelvis was performed following the standard protocol without IV contrast. COMPARISON:  CT 04/04/2021 FINDINGS: Lower chest: Lung bases demonstrate dependent atelectasis. No pleural effusion. Borderline cardiomegaly. Hepatobiliary: No focal liver abnormality is seen. No gallstones, gallbladder wall thickening, or biliary dilatation. Pancreas: Unremarkable. No pancreatic ductal dilatation or surrounding inflammatory changes. Spleen: Normal in size  without focal abnormality. Adrenals/Urinary Tract: Right adrenal gland is  normal. 2.5 cm left adrenal adenoma. Kidneys show no hydronephrosis. The bladder is normal Stomach/Bowel: Decompressed stomach with radiodense material. Questionable proximal gastric wall thickening but stomach is decompressed. No dilated small bowel. Diffuse diverticular disease of the colon without acute wall thickening. Appendix within normal limits. Vascular/Lymphatic: Advanced aortic atherosclerosis. No aneurysm. No suspicious nodes. Reproductive: Status post hysterectomy. Dense left adnexal calcification without change Other: Negative for pelvic effusion or free air. Musculoskeletal: No acute or suspicious osseous abnormality IMPRESSION: 1. No definite CT evidence for acute intra-abdominal or pelvic abnormality. 2. Possible proximal gastric wall thickening/gastritis but stomach is decompressed 3. Diverticular disease of the colon without acute wall thickening 4. Stable left adrenal adenoma Electronically Signed   By: Donavan Foil M.D.   On: 07/29/2021 21:03   CT Head Wo Contrast  Result Date: 07/29/2021 CLINICAL DATA:  Head trauma. EXAM: CT HEAD WITHOUT CONTRAST TECHNIQUE: Contiguous axial images were obtained from the base of the skull through the vertex without intravenous contrast. COMPARISON:  Head CT dated 08/25/2020. FINDINGS: Brain: Mild age-related atrophy and chronic microvascular ischemic changes. There is no acute intracranial hemorrhage. Mass effect or midline shift. No extra-axial fluid collection. Vascular: No hyperdense vessel or unexpected calcification. Skull: Normal. Negative for fracture or focal lesion. Sinuses/Orbits: No acute finding. Other: None IMPRESSION: 1. No acute intracranial pathology. 2. Mild age-related atrophy and chronic microvascular ischemic changes. Electronically Signed   By: Anner Crete M.D.   On: 07/29/2021 20:55   DG Chest Portable 1 View  Result Date: 07/29/2021 CLINICAL DATA:  Cough and dizziness. EXAM: PORTABLE CHEST 1 VIEW COMPARISON:  Chest x-ray dated  January 18, 2021. FINDINGS: Unchanged mild cardiomegaly. Chronic pulmonary vascular congestion. No focal consolidation, pleural effusion, or pneumothorax. No acute osseous abnormality. IMPRESSION: No active disease. Electronically Signed   By: Titus Dubin M.D.   On: 07/29/2021 18:48               LOS: 1 day   Lanelle Lindo  [facility]   Pager on [email or web address]. If 7PM-7AM, please contact night-coverage at [email or web address]     07/31/2021, 3:02 PM

## 2021-08-01 DIAGNOSIS — J432 Centrilobular emphysema: Secondary | ICD-10-CM

## 2021-08-01 DIAGNOSIS — E785 Hyperlipidemia, unspecified: Secondary | ICD-10-CM

## 2021-08-01 DIAGNOSIS — F3340 Major depressive disorder, recurrent, in remission, unspecified: Secondary | ICD-10-CM

## 2021-08-01 LAB — CBC WITH DIFFERENTIAL/PLATELET
Abs Immature Granulocytes: 0.01 10*3/uL (ref 0.00–0.07)
Basophils Absolute: 0 10*3/uL (ref 0.0–0.1)
Basophils Relative: 0 %
Eosinophils Absolute: 0 10*3/uL (ref 0.0–0.5)
Eosinophils Relative: 0 %
HCT: 39.9 % (ref 36.0–46.0)
Hemoglobin: 13.2 g/dL (ref 12.0–15.0)
Immature Granulocytes: 0 %
Lymphocytes Relative: 22 %
Lymphs Abs: 0.8 10*3/uL (ref 0.7–4.0)
MCH: 29.9 pg (ref 26.0–34.0)
MCHC: 33.1 g/dL (ref 30.0–36.0)
MCV: 90.3 fL (ref 80.0–100.0)
Monocytes Absolute: 0.2 10*3/uL (ref 0.1–1.0)
Monocytes Relative: 5 %
Neutro Abs: 2.6 10*3/uL (ref 1.7–7.7)
Neutrophils Relative %: 73 %
Platelets: 244 10*3/uL (ref 150–400)
RBC: 4.42 MIL/uL (ref 3.87–5.11)
RDW: 14.4 % (ref 11.5–15.5)
WBC: 3.6 10*3/uL — ABNORMAL LOW (ref 4.0–10.5)
nRBC: 0 % (ref 0.0–0.2)

## 2021-08-01 LAB — COMPREHENSIVE METABOLIC PANEL
ALT: 16 U/L (ref 0–44)
AST: 22 U/L (ref 15–41)
Albumin: 3.6 g/dL (ref 3.5–5.0)
Alkaline Phosphatase: 34 U/L — ABNORMAL LOW (ref 38–126)
Anion gap: 10 (ref 5–15)
BUN: 17 mg/dL (ref 8–23)
CO2: 30 mmol/L (ref 22–32)
Calcium: 8.8 mg/dL — ABNORMAL LOW (ref 8.9–10.3)
Chloride: 95 mmol/L — ABNORMAL LOW (ref 98–111)
Creatinine, Ser: 0.64 mg/dL (ref 0.44–1.00)
GFR, Estimated: >60 mL/min (ref >60)
Glucose, Bld: 116 mg/dL — ABNORMAL HIGH (ref 70–99)
Potassium: 3.7 mmol/L (ref 3.5–5.1)
Sodium: 135 mmol/L (ref 135–145)
Total Bilirubin: 0.4 mg/dL (ref 0.3–1.2)
Total Protein: 6.7 g/dL (ref 6.5–8.1)

## 2021-08-01 LAB — MAGNESIUM: Magnesium: 1.8 mg/dL (ref 1.7–2.4)

## 2021-08-01 LAB — D-DIMER, QUANTITATIVE: D-Dimer, Quant: 1.52 ug/mL-FEU — ABNORMAL HIGH (ref 0.00–0.50)

## 2021-08-01 LAB — C-REACTIVE PROTEIN: CRP: 1 mg/dL — ABNORMAL HIGH (ref <1.0)

## 2021-08-01 LAB — FERRITIN: Ferritin: 251 ng/mL (ref 11–307)

## 2021-08-01 MED ORDER — HALOPERIDOL LACTATE 5 MG/ML IJ SOLN
1.0000 mg | Freq: Once | INTRAMUSCULAR | Status: AC
  Administered 2021-08-01: 1 mg via INTRAVENOUS
  Filled 2021-08-01: qty 1

## 2021-08-01 NOTE — Progress Notes (Addendum)
MD paged. Pt keeps coming out of her room impulsively and is hard to redirect to her room and to stay in her room so her positive COVID-19 does not spread to other patients and so that she does not fall. MD placed orders for a soft restraint lap belt. Daughter called to inform about what is going on and permission given from daughter to use the lap belt on the patient.

## 2021-08-01 NOTE — Plan of Care (Signed)
  Problem: Education: Goal: Knowledge of General Education information will improve Description: Including pain rating scale, medication(s)/side effects and non-pharmacologic comfort measures Outcome: Progressing   Problem: Health Behavior/Discharge Planning: Goal: Ability to manage health-related needs will improve Outcome: Progressing   Problem: Clinical Measurements: Goal: Ability to maintain clinical measurements within normal limits will improve Outcome: Progressing Goal: Will remain free from infection Outcome: Progressing Goal: Diagnostic test results will improve Outcome: Progressing Goal: Respiratory complications will improve Outcome: Progressing Goal: Cardiovascular complication will be avoided Outcome: Progressing   Problem: Activity: Goal: Risk for activity intolerance will decrease Outcome: Progressing   Problem: Nutrition: Goal: Adequate nutrition will be maintained Outcome: Progressing   Problem: Coping: Goal: Level of anxiety will decrease Outcome: Progressing   Problem: Elimination: Goal: Will not experience complications related to bowel motility Outcome: Progressing Goal: Will not experience complications related to urinary retention Outcome: Progressing   Problem: Pain Managment: Goal: General experience of comfort will improve Outcome: Progressing   Problem: Safety: Goal: Ability to remain free from injury will improve Outcome: Progressing   Problem: Skin Integrity: Goal: Risk for impaired skin integrity will decrease Outcome: Progressing   Problem: Education: Goal: Knowledge of risk factors and measures for prevention of condition will improve Outcome: Progressing   Problem: Coping: Goal: Psychosocial and spiritual needs will be supported Outcome: Progressing   Problem: Respiratory: Goal: Will maintain a patent airway Outcome: Progressing Goal: Complications related to the disease process, condition or treatment will be avoided or  minimized Outcome: Progressing   Problem: Safety: Goal: Non-violent Restraint(s) Outcome: Progressing   

## 2021-08-01 NOTE — Progress Notes (Signed)
Chaplain will check in with Amanda [name] and her nurse.  Chaplain cannot complete Advanced Directive at this time with precautions in place due to paperwork and need for witnesses.    Chaplain will follow-up.    08/01/21 1300  Clinical Encounter Type  Visited With Patient  Visit Type Initial

## 2021-08-01 NOTE — Evaluation (Addendum)
Physical Therapy Evaluation Patient Details Name: Amanda [name] MRN: [id number] DOB: 01/18/1933 Today's Date: 08/01/2021  History of Present Illness  87 y.o. female with medical history significant of essential hypertension, hyperlipidemia, dementia, recurrent falls who presented to the ER with dizziness and fall at home. Also having respiratory symptoms with productive cough for almost 2 weeks.   tested + for Covid  Clinical Impression  Patient evaluated by Physical Therapy with no further acute PT needs identified. All education has been completed and the patient has no further questions.  Pt mobilizing in room without assist. No LOB during PT session, appears pt is at her baseline for mobility and cognition. No family present to confirm. VSS   See below for any follow-up Physical Therapy or equipment needs. PT is signing off. Thank you for this referral.       Recommendations for follow up therapy are one component of a multi-disciplinary discharge planning process, led by the attending physician.  Recommendations may be updated based on patient status, additional functional criteria and insurance authorization.  Follow Up Recommendations No PT follow up    Equipment Recommendations  None recommended by PT    Recommendations for Other Services       Precautions / Restrictions Precautions Precautions: Fall Precaution Comments: hx of falls Restrictions Weight Bearing Restrictions: No      Mobility  Bed Mobility Overal bed mobility: Needs Assistance                  Transfers Overall transfer level: Needs assistance Equipment used: None Transfers: Sit to/from Stand Sit to Stand: Supervision Stand pivot transfers: Supervision       General transfer comment: patient was able to complete tasks with supervision for safety. patient appears to be at baseline.  Ambulation/Gait Ambulation/Gait assistance: Supervision;Modified independent (Device/Increase time) Gait  Distance (Feet): 80 Feet Assistive device: None Gait Pattern/deviations: Step-through pattern     General Gait Details: pt amb in room, negotiating obstacles, without LOB  Stairs            Wheelchair Mobility    Modified Rankin (Stroke Patients Only)       Balance Overall balance assessment: History of Falls Sitting-balance support: No upper extremity supported;Feet supported Sitting balance-Leahy Scale: Good     Standing balance support: During functional activity;No upper extremity supported Standing balance-Leahy Scale: Fair Standing balance comment: Fair to Good, not tested to mod challenges             High level balance activites: Side stepping;Direction changes;Turns;Backward walking High Level Balance Comments: pt standing in room doign her own exercises--squats, UE shoulder flexion and extension. no LOB             Pertinent Vitals/Pain Pain Assessment: No/denies pain    Home Living Family/patient expects to be discharged to:: Private residence Living Arrangements: Alone Available Help at Discharge: Family;Available PRN/intermittently Type of Home: House Home Access: Stairs to enter   [employer] of Steps: one to two steps to enter. Home Layout: One level Home Equipment: None      Prior Function Level of Independence: Independent         Comments: per patient report. no family present at this time.     Hand Dominance   Dominant Hand: Right    Extremity/Trunk Assessment   Upper Extremity Assessment Upper Extremity Assessment: Defer to OT evaluation    Lower Extremity Assessment Lower Extremity Assessment: Overall WFL for tasks assessed       Communication  Communication: No difficulties  Cognition Arousal/Alertness: Awake/alert Behavior During Therapy: WFL for tasks assessed/performed Overall Cognitive Status: Impaired/Different from baseline Area of Impairment: Following commands                        Following Commands: Follows one step commands consistently;Follows multi-step commands consistently              General Comments      Exercises     Assessment/Plan    PT Assessment Patent does not need any further PT services  PT Problem List         PT Treatment Interventions      PT Goals (Current goals can be found in the Care Plan section)  Acute Rehab PT Goals Patient Stated Goal: to get back home PT Goal Formulation: All assessment and education complete, DC therapy    Frequency     Barriers to discharge        Co-evaluation               AM-PAC PT "6 Clicks" Mobility  Outcome Measure Help needed turning from your back to your side while in a flat bed without using bedrails?: None Help needed moving from lying on your back to sitting on the side of a flat bed without using bedrails?: None Help needed moving to and from a bed to a chair (including a wheelchair)?: None Help needed standing up from a chair using your arms (e.g., wheelchair or bedside chair)?: None Help needed to walk in hospital room?: None Help needed climbing 3-5 steps with a railing? : None 6 Click Score: 24    End of Session   Activity Tolerance: Patient tolerated treatment well Patient left: with call bell/phone within reach;in chair (in chair on arrival) Nurse Communication: Mobility status PT Visit Diagnosis: Difficulty in walking, not elsewhere classified (R26.2)    Time: [id number] PT Time Calculation (min) (ACUTE ONLY): 9 min   Charges:   PT Evaluation $PT Eval Low Complexity: [place], PT  Acute Rehab Dept ([place]) [phone] Pager [phone]  08/01/2021   [facility] 08/01/2021, 4:37 PM

## 2021-08-01 NOTE — Progress Notes (Signed)
PROGRESS NOTE  Amanda [name]  [id number] DOB: Mar 28, 1933 DOA: 07/29/2021 PCP: Caren Macadam, MD   Brief Narrative: Amanda [name] is an 87 y.o. female with a history of HTN, HLD, reactive airway disease, dementia who presented to the ED 9/9 with cough, dyspnea, fatigue, myalgias, and falls at home. She was found to have positive SARS-CoV-2 PCR with no infiltrate on CXR, though did have mild hypoxemia to 88% on room air for which remdesivir and decadron were started.  Assessment & Plan: Principal Problem:   COVID-19 virus infection Active Problems:   HTN (hypertension)   Hyperlipidemia   Centrilobular emphysema ([place])   Dementia ([place])   Recurrent major depression in remission ([place])  Acute hypoxemic respiratory failure due to covid-19 infection: +PCR 9/9. s/p Pfizer vaccines Jan, Feb and Oct 2021. - Continue remdesivir to complete 5th dose 9/13. CRP reassuring and responsive (2 > 1)  - Decadron given for hypoxemia which has resolved. Respiratory effort, in fact has also improved dramatically, will stop steroid. D-dimer is elevated, though is responding and respiratory status is improving as expected, so will not pursue CTA at this time.   COPD: Without exacerbation.  - Continue prn BDs  Hypokalemia: Supplemented and durably resolved  HTN:  - Continue HCTZ, norvasc '10mg'$   Dementia: Lives alone, functionally is at baseline. No acute CT head findings, just mild age-related atrophy, chronic microvascular ischemic changes. - Continue aricept, namenda  Depression:  - Continue SSRI  GERD:  - Continue PPI  PVCs: Noted on personal review of telemetry today with coexisting frequent artifact. No sustained beats.   DVT prophylaxis: Eliquis 2.'5mg'$  po BID (has needle phobia) Code Status: Full Family Communication: None at bedside Disposition Plan:  Status is: Inpatient  Remains inpatient appropriate because:Inpatient level of care appropriate due to severity of illness  Dispo: The  patient is from: Home              Anticipated d/c is to: Home              Patient currently is not medically stable to d/c.   Difficult to place patient No  Consultants:  None  Procedures:  None  Antimicrobials: Remdesivir   Subjective: Pt feeling much better. No dyspnea with exertion. No chest pain or other complaints. Wants to go home.  Objective: Vitals:   07/31/21 1530 07/31/21 2038 08/01/21 0556 08/01/21 0832  BP: 117/69 138/80 (!) 144/93 139/81  Pulse: 65 60 63 60  Resp: '16 15 15 16  '$ Temp: 98.5 F (36.9 C) 98.4 F (36.9 C) (!) 97.5 F (36.4 C) 97.6 F (36.4 C)  TempSrc: Oral Oral Oral Oral  SpO2: 98% 95% 90% 96%  Weight:      Height:        Intake/Output Summary (Last 24 hours) at 08/01/2021 1702 Last data filed at 08/01/2021 1059 Gross per 24 hour  Intake 360 ml  Output --  Net 360 ml   Filed Weights   07/30/21 0745  Weight: 51.7 kg    Gen: Interactive elderly female in no distress  Pulm: Non-labored breathing. Clear to auscultation bilaterally.  CV: Regular rate and rhythm. No murmur, rub, or gallop. No JVD, no pedal edema. GI: Abdomen soft, non-tender, non-distended, with normoactive bowel sounds. No organomegaly or masses felt. Ext: Warm, no deformities Skin: No rashes, lesions or ulcers Neuro: Alert and incompletely oriented. No focal neurological deficits. Psych: Judgement and insight appear marginal. Mood & affect appropriate.   Data Reviewed: I have personally reviewed following  labs and imaging studies  CBC: Recent Labs  Lab 07/29/21 1518 07/31/21 0433 08/01/21 0347  WBC 6.1 3.4* 3.6*  NEUTROABS  --  2.5 2.6  HGB 14.0 13.2 13.2  HCT 41.9 40.0 39.9  MCV 90.5 91.1 90.3  PLT 241 246 [id number]   Basic Metabolic Panel: Recent Labs  Lab 07/29/21 1518 07/29/21 1841 07/30/21 1610 07/31/21 0433 08/01/21 0347  NA 135  --  136 138 135  K 2.8*  --  3.6 3.6 3.7  CL 91*  --  101 97* 95*  CO2 33*  --  27 32 30  GLUCOSE 110*  --  98 109* 116*   BUN 14  --  '13 14 17  '$ CREATININE 0.78  --  0.58 0.58 0.64  CALCIUM 9.3  --  8.2* 9.1 8.8*  MG  --  1.8  --  1.7 1.8   GFR: Estimated Creatinine Clearance: 38.1 mL/min (by C-G formula based on SCr of 0.64 mg/dL). Liver Function Tests: Recent Labs  Lab 07/31/21 0433 08/01/21 0347  AST 22 22  ALT 15 16  ALKPHOS 37* 34*  BILITOT 0.6 0.4  PROT 7.1 6.7  ALBUMIN 3.5 3.6   No results for input(s): LIPASE, AMYLASE in the last 168 hours. No results for input(s): AMMONIA in the last 168 hours. Coagulation Profile: No results for input(s): INR, PROTIME in the last 168 hours. Cardiac Enzymes: No results for input(s): CKTOTAL, CKMB, CKMBINDEX, TROPONINI in the last 168 hours. BNP (last 3 results) No results for input(s): PROBNP in the last 8760 hours. HbA1C: No results for input(s): HGBA1C in the last 72 hours. CBG: Recent Labs  Lab 07/29/21 1534  GLUCAP 103*   Lipid Profile: No results for input(s): CHOL, HDL, LDLCALC, TRIG, CHOLHDL, LDLDIRECT in the last 72 hours. Thyroid Function Tests: No results for input(s): TSH, T4TOTAL, FREET4, T3FREE, THYROIDAB in the last 72 hours. Anemia Panel: Recent Labs    07/31/21 0433 08/01/21 0347  FERRITIN 250 251   Urine analysis:    Component Value Date/Time   COLORURINE YELLOW 07/29/2021 [place] 07/29/2021 1642   LABSPEC 1.020 07/29/2021 1642   PHURINE 6.0 07/29/2021 1642   GLUCOSEU NEGATIVE 07/29/2021 1642   HGBUR NEGATIVE 07/29/2021 1642   BILIRUBINUR NEGATIVE 07/29/2021 1642   BILIRUBINUR n 12/28/2015 1438   KETONESUR NEGATIVE 07/29/2021 1642   PROTEINUR NEGATIVE 07/29/2021 1642   UROBILINOGEN 0.2 12/28/2015 1438   UROBILINOGEN 0.2 08/22/2013 1525   NITRITE NEGATIVE 07/29/2021 1642   LEUKOCYTESUR NEGATIVE 07/29/2021 1642   Recent Results (from the past 240 hour(s))  Resp Panel by RT-PCR (Flu A&B, Covid) Nasopharyngeal Swab     Status: Abnormal   Collection Time: 07/29/21  3:18 PM   Specimen:  Nasopharyngeal Swab; Nasopharyngeal(NP) swabs in vial transport medium  Result Value Ref Range Status   SARS Coronavirus 2 by RT PCR POSITIVE (A) NEGATIVE Final    Comment: RESULT CALLED TO, READ BACK BY AND VERIFIED WITH: Rand S RN [id number] PHILLIPS C (NOTE) SARS-CoV-2 target nucleic acids are DETECTED.  The SARS-CoV-2 RNA is generally detectable in upper respiratory specimens during the acute phase of infection. Positive results are indicative of the presence of the identified virus, but do not rule out bacterial infection or co-infection with other pathogens not detected by the test. Clinical correlation with patient history and other diagnostic information is necessary to determine patient infection status. The expected result is Negative.  Fact Sheet for Patients: EntrepreneurPulse.com.au  Fact Sheet for  Healthcare Providers: IncredibleEmployment.be  This test is not yet approved or cleared by the [place] and  has been authorized for detection and/or diagnosis of SARS-CoV-2 by FDA under an Emergency Use Authorization (EUA).  This EUA will remain in effect (meaning this test can be  used) for the duration of  the COVID-19 declaration under Section 564(b)(1) of the Act, 21 U.S.C. section 360bbb-3(b)(1), unless the authorization is terminated or revoked sooner.     Influenza A by PCR NEGATIVE NEGATIVE Final   Influenza B by PCR NEGATIVE NEGATIVE Final    Comment: (NOTE) The Xpert Xpress SARS-CoV-2/FLU/RSV plus assay is intended as an aid in the diagnosis of influenza from Nasopharyngeal swab specimens and should not be used as a sole basis for treatment. Nasal washings and aspirates are unacceptable for Xpert Xpress SARS-CoV-2/FLU/RSV testing.  Fact Sheet for Patients: EntrepreneurPulse.com.au  Fact Sheet for Healthcare Providers: IncredibleEmployment.be  This test is not yet approved  or cleared by the [place] FDA and has been authorized for detection and/or diagnosis of SARS-CoV-2 by FDA under an Emergency Use Authorization (EUA). This EUA will remain in effect (meaning this test can be used) for the duration of the COVID-19 declaration under Section 564(b)(1) of the Act, 21 U.S.C. section 360bbb-3(b)(1), unless the authorization is terminated or revoked.  Performed at [facility], [address] [place], [place] [address]       Radiology Studies: No results found.  Scheduled Meds:  albuterol  2 puff Inhalation Q6H   amLODipine  10 mg Oral Daily   apixaban  2.5 mg Oral BID   citalopram  20 mg Oral Daily   dexamethasone  6 mg Oral Q24H   donepezil  10 mg Oral QHS   ferrous sulfate  325 mg Oral Q breakfast   hydrochlorothiazide  25 mg Oral q morning   mouth rinse  15 mL Mouth Rinse BID   memantine  10 mg Oral BID   pantoprazole  40 mg Oral BID   vitamin B-12  1,000 mcg Oral Daily   Continuous Infusions:  remdesivir 100 mg in NS 100 mL 100 mg (08/01/21 0918)     LOS: 2 days   Time spent: 25 minutes.  Patrecia Pour, MD [facility] [email or web address] 08/01/2021, 5:02 PM

## 2021-08-01 NOTE — Evaluation (Signed)
Occupational Therapy Evaluation Patient Details Name: Amanda [name] MRN: [id number] DOB: 1932/12/23 Today's Date: 08/01/2021   History of Present Illness 87 y.o. female with medical history significant of essential hypertension, hyperlipidemia, dementia, recurrent falls who presented to the ER with dizziness and fall at home. Also having respiratory symptoms with productive cough for almost 2 weeks.   tested + for Covid   Clinical Impression   Patient is a 87 year old female who was admitted for above diagnosis. Patient currently appears to be at baseline for ADLs and functional mobility in room.patient was able to complete all ADL tasks with supervision for safety. Patient recommended to have supervision at home for safety. Patient does not need further skilled OT interventions at this time.      Recommendations for follow up therapy are one component of a multi-disciplinary discharge planning process, led by the attending physician.  Recommendations may be updated based on patient status, additional functional criteria and insurance authorization.   Follow Up Recommendations  Supervision/Assistance - 24 hour;No OT follow up    Equipment Recommendations  None recommended by OT    Recommendations for Other Services       Precautions / Restrictions Precautions Precautions: Fall Precaution Comments: hx of falls Restrictions Weight Bearing Restrictions: No      Mobility Bed Mobility Overal bed mobility: Needs Assistance                  Transfers Overall transfer level: Needs assistance Equipment used: None Transfers: Sit to/from Stand Sit to Stand: Supervision Stand pivot transfers: Supervision       General transfer comment: patient was able to complete tasks with supervision for safety. patient appears to be at baseline.    Balance Overall balance assessment: History of Falls Sitting-balance support: No upper extremity supported;Feet supported Sitting  balance-Leahy Scale: Good     Standing balance support: During functional activity;No upper extremity supported Standing balance-Leahy Scale: Fair Standing balance comment: Fair to Good, not tested to mod challenges             High level balance activites: Side stepping;Direction changes;Turns;Backward walking High Level Balance Comments: pt standing in room doign her own exercises--squats, UE shoulder flexion and extension. no LOB           ADL either performed or assessed with clinical judgement   ADL Overall ADL's : At baseline                                       General ADL Comments: patient completed toileting tasks, LB dressing tasks and functional mobility with supervision for cues for safety in room. patient appears to be at baseline at this time. Patient scored 3 on Mini Cog assessment indicating need for further cognitive assessments.     Vision         Perception     Praxis      Pertinent Vitals/Pain Pain Assessment: No/denies pain     Hand Dominance Right   Extremity/Trunk Assessment Upper Extremity Assessment Upper Extremity Assessment: Defer to OT evaluation   Lower Extremity Assessment Lower Extremity Assessment: Overall WFL for tasks assessed       Communication Communication Communication: No difficulties   Cognition Arousal/Alertness: Awake/alert Behavior During Therapy: WFL for tasks assessed/performed Overall Cognitive Status: Impaired/Different from baseline Area of Impairment: Following commands  Following Commands: Follows one step commands consistently;Follows multi-step commands consistently           General Comments       Exercises     Shoulder Instructions      Home Living Family/patient expects to be discharged to:: Private residence Living Arrangements: Alone Available Help at Discharge: Family;Available PRN/intermittently Type of Home: House Home Access: Stairs  to enter [employer] of Steps: one to two steps to enter.   Home Layout: One level     Bathroom Shower/Tub: Occupational psychologist: Standard     Home Equipment: None          Prior Functioning/Environment Level of Independence: Independent        Comments: per patient report. no family present at this time.        OT Problem List:        OT Treatment/Interventions:      OT Goals(Current goals can be found in the care plan section) Acute Rehab OT Goals Patient Stated Goal: to get back home OT Goal Formulation: All assessment and education complete, DC therapy  OT Frequency:     Barriers to D/C: Decreased caregiver support  patient lives at home alone       Co-evaluation              AM-PAC OT "6 Clicks" Daily Activity     Outcome Measure Help from another person eating meals?: None Help from another person taking care of personal grooming?: A Little Help from another person toileting, which includes using toliet, bedpan, or urinal?: A Little Help from another person bathing (including washing, rinsing, drying)?: A Little Help from another person to put on and taking off regular upper body clothing?: None Help from another person to put on and taking off regular lower body clothing?: None 6 Click Score: 21   End of Session Equipment Utilized During Treatment: Gait belt Nurse Communication: Mobility status  Activity Tolerance: Patient tolerated treatment well Patient left: in chair;with call bell/phone within reach;Other (comment) (MD in room)  OT Visit Diagnosis: Unsteadiness on feet (R26.81)                Time: [id number] OT Time Calculation (min): 19 min Charges:  OT General Charges $OT Visit: 1 Visit OT Evaluation $OT Eval Low Complexity: 1 Low  Leota Sauers, MS Acute Rehabilitation Department Office# [phone] Pager# [phone]   Bernard 08/01/2021, 4:49 PM

## 2021-08-01 NOTE — Progress Notes (Signed)
MD paged. Pt has dementia and she is beginning to get confused stating she is at her friends house and has to go home.

## 2021-08-01 NOTE — Plan of Care (Signed)
  Problem: Health Behavior/Discharge Planning: Goal: Ability to manage health-related needs will improve Outcome: Progressing   Problem: Education: Goal: Knowledge of General Education information will improve Description: Including pain rating scale, medication(s)/side effects and non-pharmacologic comfort measures Outcome: Progressing   Problem: Clinical Measurements: Goal: Diagnostic test results will improve Outcome: Progressing   

## 2021-08-02 DIAGNOSIS — F0390 Unspecified dementia without behavioral disturbance: Secondary | ICD-10-CM

## 2021-08-02 NOTE — Progress Notes (Signed)
Discharge education provided to patient and granddaughter Camisha.  PIV removed.  Patient escorted via wheelchair to main entrance, picked up by granddaughter for transport home.

## 2021-08-02 NOTE — Discharge Summary (Addendum)
Physician Discharge Summary  Amanda [name] [id number] DOB: 1933/05/16 DOA: 07/29/2021  PCP: Caren Macadam, MD  Admit date: 07/29/2021 Discharge date: 08/02/2021  Admitted From: Home Disposition: Home   Recommendations for Outpatient Follow-up:  Follow up with PCP in 1-2 weeks  Home Health: None recommended Equipment/Devices: None new Discharge Condition: Stable CODE STATUS: Full Diet recommendation: Heart healthy  Brief/Interim Summary: Amanda [name] is an 87 y.o. female with a history of HTN, HLD, reactive airway disease, dementia who presented to the ED 9/9 with cough, dyspnea, fatigue, myalgias, and falls at home. She was found to have positive SARS-CoV-2 PCR with no infiltrate on CXR, though did have mild hypoxemia to 88% on room air for which remdesivir and decadron were started. With treatment her respiratory status normalized, hypoxia resolved, and inflammatory markers improved. She is stable and back to her baseline at time of discharge.  Discharge Diagnoses:  Principal Problem:   COVID-19 virus infection Active Problems:   HTN (hypertension)   Hyperlipidemia   Centrilobular emphysema ([place])   Dementia ([place])   Recurrent major depression in remission ([place])  Acute hypoxemic respiratory failure due to covid-19 infection: +PCR 9/9. s/p Pfizer vaccines Jan, Feb and Oct 2021. - Completed 5 days of remdesivir on 9/13. CRP reassuring and responsive (2 > 1)  - Decadron given for hypoxemia which has resolved. Respiratory effort, in fact has also improved dramatically, will stop steroid. D-dimer is elevated, though is responding and respiratory status is improving as expected, so will not pursue CTA at this time. No exertional hypoxia and ambulating without dyspnea at time of discharge.   COPD: Without exacerbation.  - Continue prn albuterol.   Hypokalemia: Supplemented and durably resolved   HTN:  - Continue HCTZ, norvasc '10mg'$    Dementia: Lives alone, functionally is at  baseline. No acute CT head findings, just mild age-related atrophy, chronic microvascular ischemic changes. - Continue aricept, namenda   Depression:  - Continue SSRI   GERD:  - Continue PPI  Discharge Instructions Discharge Instructions     Diet - low sodium heart healthy   Complete by: As directed    Discharge instructions   Complete by: As directed    You were treated for covid-19 infection which has improved dramatically with antiviral treatment. You no loner require any medications to treat this and you are stable for discharge with plans to continue your usual medications as you were before admission. You will need to stay at home for 5 more days and wear a mask anytime you are around other people (who should wear a mask and maintain 6 ft distance as well). If your symptoms return, seek medical attention right away. Otherwise, follow up with your PCP in 1-2 weeks for recheck.      Allergies as of 08/02/2021   No Known Allergies      Medication List     TAKE these medications    albuterol 108 (90 Base) MCG/ACT inhaler Commonly known as: VENTOLIN HFA INHALE TWO PUFFS BY MOUTH INTO LUNGS every SIX hours AS NEEDED   albuterol (2.5 MG/3ML) 0.083% nebulizer solution Commonly known as: PROVENTIL Take 3 mLs (2.5 mg total) by nebulization every 6 (six) hours as needed for wheezing or shortness of breath.   amLODipine 10 MG tablet Commonly known as: NORVASC Take 10 mg by mouth daily.   citalopram 20 MG tablet Commonly known as: CELEXA Take 20 mg by mouth daily.   donepezil 10 MG tablet Commonly known as: ARICEPT Take 10 mg by  mouth at bedtime.   ferrous sulfate 325 (65 FE) MG tablet Take 325 mg by mouth 2 (two) times daily.   hydrochlorothiazide 25 MG tablet Commonly known as: HYDRODIURIL Take 25 mg by mouth every morning.   memantine 10 MG tablet Commonly known as: NAMENDA Take 1 tablet (10 mg total) by mouth 2 (two) times daily.   oxyCODONE-acetaminophen  5-325 MG tablet Commonly known as: PERCOCET/ROXICET Take 1 tablet by mouth every 6 (six) hours as needed for severe pain.   pantoprazole 40 MG tablet Commonly known as: PROTONIX Take 40 mg by mouth 2 (two) times daily.   vitamin B-12 1000 MCG tablet Commonly known as: CYANOCOBALAMIN Take 1,000 mcg by mouth daily.        Follow-up Information     Caren Macadam, MD Follow up.   Specialty: Family Medicine Contact information: [place] [address] [phone]                No Known Allergies  Consultations: None  Procedures/Studies: CT Abdomen Pelvis Wo Contrast  Result Date: 07/29/2021 CLINICAL DATA:  Abdomen pain EXAM: CT ABDOMEN AND PELVIS WITHOUT CONTRAST TECHNIQUE: Multidetector CT imaging of the abdomen and pelvis was performed following the standard protocol without IV contrast. COMPARISON:  CT 04/04/2021 FINDINGS: Lower chest: Lung bases demonstrate dependent atelectasis. No pleural effusion. Borderline cardiomegaly. Hepatobiliary: No focal liver abnormality is seen. No gallstones, gallbladder wall thickening, or biliary dilatation. Pancreas: Unremarkable. No pancreatic ductal dilatation or surrounding inflammatory changes. Spleen: Normal in size without focal abnormality. Adrenals/Urinary Tract: Right adrenal gland is normal. 2.5 cm left adrenal adenoma. Kidneys show no hydronephrosis. The bladder is normal Stomach/Bowel: Decompressed stomach with radiodense material. Questionable proximal gastric wall thickening but stomach is decompressed. No dilated small bowel. Diffuse diverticular disease of the colon without acute wall thickening. Appendix within normal limits. Vascular/Lymphatic: Advanced aortic atherosclerosis. No aneurysm. No suspicious nodes. Reproductive: Status post hysterectomy. Dense left adnexal calcification without change Other: Negative for pelvic effusion or free air. Musculoskeletal: No acute or suspicious osseous abnormality  IMPRESSION: 1. No definite CT evidence for acute intra-abdominal or pelvic abnormality. 2. Possible proximal gastric wall thickening/gastritis but stomach is decompressed 3. Diverticular disease of the colon without acute wall thickening 4. Stable left adrenal adenoma Electronically Signed   By: Donavan Foil M.D.   On: 07/29/2021 21:03   DG Ankle Complete Right  Result Date: 07/23/2021 CLINICAL DATA:  Right leg pain following right leg injury EXAM: RIGHT ANKLE - COMPLETE 3+ VIEW COMPARISON:  None. FINDINGS: Imaging is slightly limited by obliquity on lateral examination, however, there is normal alignment. No definite fracture or dislocation. Ankle mortise appears aligned. No ankle effusion. Mild soft tissue swelling is noted dorsal to the midfoot. IMPRESSION: Mild soft tissue swelling dorsal to the midfoot. No acute fracture or dislocation. Electronically Signed   By: Fidela Salisbury M.D.   On: 07/23/2021 19:24   CT Head Wo Contrast  Result Date: 07/29/2021 CLINICAL DATA:  Head trauma. EXAM: CT HEAD WITHOUT CONTRAST TECHNIQUE: Contiguous axial images were obtained from the base of the skull through the vertex without intravenous contrast. COMPARISON:  Head CT dated 08/25/2020. FINDINGS: Brain: Mild age-related atrophy and chronic microvascular ischemic changes. There is no acute intracranial hemorrhage. Mass effect or midline shift. No extra-axial fluid collection. Vascular: No hyperdense vessel or unexpected calcification. Skull: Normal. Negative for fracture or focal lesion. Sinuses/Orbits: No acute finding. Other: None IMPRESSION: 1. No acute intracranial pathology. 2. Mild age-related atrophy and  chronic microvascular ischemic changes. Electronically Signed   By: Anner Crete M.D.   On: 07/29/2021 20:55   DG Chest Portable 1 View  Result Date: 07/29/2021 CLINICAL DATA:  Cough and dizziness. EXAM: PORTABLE CHEST 1 VIEW COMPARISON:  Chest x-ray dated January 18, 2021. FINDINGS: Unchanged mild  cardiomegaly. Chronic pulmonary vascular congestion. No focal consolidation, pleural effusion, or pneumothorax. No acute osseous abnormality. IMPRESSION: No active disease. Electronically Signed   By: Titus Dubin M.D.   On: 07/29/2021 18:48   DG Foot Complete Right  Result Date: 07/23/2021 CLINICAL DATA:  Pain, injury yesterday.  Struck foot against a door. EXAM: RIGHT FOOT COMPLETE - 3+ VIEW COMPARISON:  None. FINDINGS: There is no evidence of fracture or dislocation. Scattered osteoarthritis with spurring and joint space narrowing. No erosion. Small Achilles tendon enthesophyte. IMPRESSION: No fracture or subluxation of the right foot. Scattered osteoarthritis. Electronically Signed   By: Keith Rake M.D.   On: 07/23/2021 16:41      Subjective: Feels well, walking without assistance without dyspnea or hypoxia. No chest pain or fevers. Wants to go home.   Discharge Exam: Vitals:   08/01/21 2038 08/02/21 0621  BP: (!) 158/86 (!) 175/78  Pulse: 60 (!) 52  Resp: 18 16  Temp: 97.8 F (36.6 C) 98.4 F (36.9 C)  SpO2: 97% 93%   General: Pt is alert, awake, not in acute distress Cardiovascular: RRR, S1/S2 +, no rubs, no gallops Respiratory: CTA bilaterally, no wheezing, no rhonchi Abdominal: Soft, NT, ND, bowel sounds + Extremities: No edema, no cyanosis  Labs: BNP (last 3 results) No results for input(s): BNP in the last 8760 hours. Basic Metabolic Panel: Recent Labs  Lab 07/29/21 1518 07/29/21 1841 07/30/21 1610 07/31/21 0433 08/01/21 0347  NA 135  --  136 138 135  K 2.8*  --  3.6 3.6 3.7  CL 91*  --  101 97* 95*  CO2 33*  --  27 32 30  GLUCOSE 110*  --  98 109* 116*  BUN 14  --  '13 14 17  '$ CREATININE 0.78  --  0.58 0.58 0.64  CALCIUM 9.3  --  8.2* 9.1 8.8*  MG  --  1.8  --  1.7 1.8   Liver Function Tests: Recent Labs  Lab 07/31/21 0433 08/01/21 0347  AST 22 22  ALT 15 16  ALKPHOS 37* 34*  BILITOT 0.6 0.4  PROT 7.1 6.7  ALBUMIN 3.5 3.6   No results for  input(s): LIPASE, AMYLASE in the last 168 hours. No results for input(s): AMMONIA in the last 168 hours. CBC: Recent Labs  Lab 07/29/21 1518 07/31/21 0433 08/01/21 0347  WBC 6.1 3.4* 3.6*  NEUTROABS  --  2.5 2.6  HGB 14.0 13.2 13.2  HCT 41.9 40.0 39.9  MCV 90.5 91.1 90.3  PLT 241 246 244   Cardiac Enzymes: No results for input(s): CKTOTAL, CKMB, CKMBINDEX, TROPONINI in the last 168 hours. BNP: Invalid input(s): POCBNP CBG: Recent Labs  Lab 07/29/21 1534  GLUCAP 103*   D-Dimer Recent Labs    07/31/21 0433 08/01/21 0347  DDIMER 2.15* 1.52*   Hgb A1c No results for input(s): HGBA1C in the last 72 hours. Lipid Profile No results for input(s): CHOL, HDL, LDLCALC, TRIG, CHOLHDL, LDLDIRECT in the last 72 hours. Thyroid function studies No results for input(s): TSH, T4TOTAL, T3FREE, THYROIDAB in the last 72 hours.  Invalid input(s): FREET3 Anemia work up Recent Labs    07/31/21 0433 08/01/21 [place]  250 251   Urinalysis    Component Value Date/Time   COLORURINE YELLOW 07/29/2021 1642   APPEARANCEUR CLEAR 07/29/2021 1642   LABSPEC 1.020 07/29/2021 1642   PHURINE 6.0 07/29/2021 1642   GLUCOSEU NEGATIVE 07/29/2021 1642   HGBUR NEGATIVE 07/29/2021 1642   BILIRUBINUR NEGATIVE 07/29/2021 1642   BILIRUBINUR n 12/28/2015 1438   KETONESUR NEGATIVE 07/29/2021 1642   PROTEINUR NEGATIVE 07/29/2021 1642   UROBILINOGEN 0.2 12/28/2015 1438   UROBILINOGEN 0.2 08/22/2013 1525   NITRITE NEGATIVE 07/29/2021 [place] 07/29/2021 1642    Microbiology Recent Results (from the past 240 hour(s))  Resp Panel by RT-PCR (Flu A&B, Covid) Nasopharyngeal Swab     Status: Abnormal   Collection Time: 07/29/21  3:18 PM   Specimen: Nasopharyngeal Swab; Nasopharyngeal(NP) swabs in vial transport medium  Result Value Ref Range Status   SARS Coronavirus 2 by RT PCR POSITIVE (A) NEGATIVE Final    Comment: RESULT CALLED TO, READ BACK BY AND VERIFIED WITH: [place] S  RN [id number] PHILLIPS C (NOTE) SARS-CoV-2 target nucleic acids are DETECTED.  The SARS-CoV-2 RNA is generally detectable in upper respiratory specimens during the acute phase of infection. Positive results are indicative of the presence of the identified virus, but do not rule out bacterial infection or co-infection with other pathogens not detected by the test. Clinical correlation with patient history and other diagnostic information is necessary to determine patient infection status. The expected result is Negative.  Fact Sheet for Patients: EntrepreneurPulse.com.au  Fact Sheet for Healthcare Providers: IncredibleEmployment.be  This test is not yet approved or cleared by the [place] FDA and  has been authorized for detection and/or diagnosis of SARS-CoV-2 by FDA under an Emergency Use Authorization (EUA).  This EUA will remain in effect (meaning this test can be  used) for the duration of  the COVID-19 declaration under Section 564(b)(1) of the Act, 21 U.S.C. section 360bbb-3(b)(1), unless the authorization is terminated or revoked sooner.     Influenza A by PCR NEGATIVE NEGATIVE Final   Influenza B by PCR NEGATIVE NEGATIVE Final    Comment: (NOTE) The Xpert Xpress SARS-CoV-2/FLU/RSV plus assay is intended as an aid in the diagnosis of influenza from Nasopharyngeal swab specimens and should not be used as a sole basis for treatment. Nasal washings and aspirates are unacceptable for Xpert Xpress SARS-CoV-2/FLU/RSV testing.  Fact Sheet for Patients: EntrepreneurPulse.com.au  Fact Sheet for Healthcare Providers: IncredibleEmployment.be  This test is not yet approved or cleared by the [place] FDA and has been authorized for detection and/or diagnosis of SARS-CoV-2 by FDA under an Emergency Use Authorization (EUA). This EUA will remain in effect (meaning this test can be used) for the  duration of the COVID-19 declaration under Section 564(b)(1) of the Act, 21 U.S.C. section 360bbb-3(b)(1), unless the authorization is terminated or revoked.  Performed at [facility], [address] [place], [place] [address]     Time coordinating discharge: Approximately 40 minutes  Patrecia Pour, MD  [facility] 08/02/2021, 11:22 AM

## 2021-08-12 DIAGNOSIS — D649 Anemia, unspecified: Secondary | ICD-10-CM | POA: Diagnosis not present

## 2021-08-12 DIAGNOSIS — M19012 Primary osteoarthritis, left shoulder: Secondary | ICD-10-CM | POA: Diagnosis not present

## 2021-08-12 DIAGNOSIS — I10 Essential (primary) hypertension: Secondary | ICD-10-CM | POA: Diagnosis not present

## 2021-08-12 DIAGNOSIS — M19011 Primary osteoarthritis, right shoulder: Secondary | ICD-10-CM | POA: Diagnosis not present

## 2021-08-12 DIAGNOSIS — R54 Age-related physical debility: Secondary | ICD-10-CM | POA: Diagnosis not present

## 2021-08-12 DIAGNOSIS — J432 Centrilobular emphysema: Secondary | ICD-10-CM | POA: Diagnosis not present

## 2021-08-12 DIAGNOSIS — D509 Iron deficiency anemia, unspecified: Secondary | ICD-10-CM | POA: Diagnosis not present

## 2021-08-12 DIAGNOSIS — G8929 Other chronic pain: Secondary | ICD-10-CM | POA: Diagnosis not present

## 2021-08-15 DIAGNOSIS — J432 Centrilobular emphysema: Secondary | ICD-10-CM | POA: Diagnosis not present

## 2021-08-18 ENCOUNTER — Other Ambulatory Visit: Payer: Self-pay | Admitting: Physician Assistant

## 2021-08-18 ENCOUNTER — Ambulatory Visit
Admission: RE | Admit: 2021-08-18 | Discharge: 2021-08-18 | Disposition: A | Discharge status: Home / Self Care | Payer: Medicare Other | Source: Ambulatory Visit | Attending: Physician Assistant | Admitting: Physician Assistant

## 2021-08-18 DIAGNOSIS — M7989 Other specified soft tissue disorders: Secondary | ICD-10-CM | POA: Diagnosis not present

## 2021-08-18 DIAGNOSIS — R300 Dysuria: Secondary | ICD-10-CM | POA: Diagnosis not present

## 2021-08-19 ENCOUNTER — Ambulatory Visit
Admission: RE | Admit: 2021-08-19 | Discharge: 2021-08-19 | Disposition: A | Discharge status: Home / Self Care | Payer: Medicare Other | Source: Ambulatory Visit | Attending: Physician Assistant | Admitting: Physician Assistant

## 2021-08-19 ENCOUNTER — Other Ambulatory Visit: Payer: Self-pay | Admitting: Physician Assistant

## 2021-08-19 DIAGNOSIS — R300 Dysuria: Secondary | ICD-10-CM | POA: Diagnosis not present

## 2021-08-19 DIAGNOSIS — M79672 Pain in left foot: Secondary | ICD-10-CM

## 2021-08-24 DIAGNOSIS — T148XXA Other injury of unspecified body region, initial encounter: Secondary | ICD-10-CM | POA: Diagnosis not present

## 2021-08-24 DIAGNOSIS — M79672 Pain in left foot: Secondary | ICD-10-CM | POA: Diagnosis not present

## 2021-08-24 DIAGNOSIS — E876 Hypokalemia: Secondary | ICD-10-CM | POA: Diagnosis not present

## 2021-08-24 DIAGNOSIS — Z23 Encounter for immunization: Secondary | ICD-10-CM | POA: Diagnosis not present

## 2021-08-24 DIAGNOSIS — I10 Essential (primary) hypertension: Secondary | ICD-10-CM | POA: Diagnosis not present

## 2021-09-07 DIAGNOSIS — M79672 Pain in left foot: Secondary | ICD-10-CM | POA: Diagnosis not present

## 2021-09-07 DIAGNOSIS — M25562 Pain in left knee: Secondary | ICD-10-CM | POA: Diagnosis not present

## 2021-09-07 DIAGNOSIS — M25572 Pain in left ankle and joints of left foot: Secondary | ICD-10-CM | POA: Diagnosis not present

## 2021-09-13 DIAGNOSIS — J432 Centrilobular emphysema: Secondary | ICD-10-CM | POA: Diagnosis not present

## 2021-09-13 DIAGNOSIS — I10 Essential (primary) hypertension: Secondary | ICD-10-CM | POA: Diagnosis not present

## 2021-09-13 DIAGNOSIS — D649 Anemia, unspecified: Secondary | ICD-10-CM | POA: Diagnosis not present

## 2021-09-13 DIAGNOSIS — M6281 Muscle weakness (generalized): Secondary | ICD-10-CM | POA: Diagnosis not present

## 2021-09-13 DIAGNOSIS — R54 Age-related physical debility: Secondary | ICD-10-CM | POA: Diagnosis not present

## 2021-09-13 DIAGNOSIS — D509 Iron deficiency anemia, unspecified: Secondary | ICD-10-CM | POA: Diagnosis not present

## 2021-09-13 DIAGNOSIS — G8929 Other chronic pain: Secondary | ICD-10-CM | POA: Diagnosis not present

## 2021-09-13 DIAGNOSIS — M79605 Pain in left leg: Secondary | ICD-10-CM | POA: Diagnosis not present

## 2021-09-14 DIAGNOSIS — J432 Centrilobular emphysema: Secondary | ICD-10-CM | POA: Diagnosis not present

## 2021-09-15 DIAGNOSIS — M6281 Muscle weakness (generalized): Secondary | ICD-10-CM | POA: Diagnosis not present

## 2021-09-15 DIAGNOSIS — M79605 Pain in left leg: Secondary | ICD-10-CM | POA: Diagnosis not present

## 2021-09-16 ENCOUNTER — Other Ambulatory Visit: Payer: Self-pay

## 2021-09-16 ENCOUNTER — Encounter: Payer: Self-pay | Admitting: Podiatry

## 2021-09-16 ENCOUNTER — Ambulatory Visit: Payer: Medicare Other | Admitting: Podiatry

## 2021-09-16 DIAGNOSIS — M79675 Pain in left toe(s): Secondary | ICD-10-CM

## 2021-09-16 DIAGNOSIS — M79672 Pain in left foot: Secondary | ICD-10-CM

## 2021-09-16 DIAGNOSIS — M79674 Pain in right toe(s): Secondary | ICD-10-CM

## 2021-09-16 DIAGNOSIS — B351 Tinea unguium: Secondary | ICD-10-CM

## 2021-09-16 DIAGNOSIS — L84 Corns and callosities: Secondary | ICD-10-CM

## 2021-09-16 DIAGNOSIS — M79671 Pain in right foot: Secondary | ICD-10-CM

## 2021-09-16 DIAGNOSIS — Q828 Other specified congenital malformations of skin: Secondary | ICD-10-CM

## 2021-09-16 NOTE — Progress Notes (Signed)
  Subjective:  Patient ID: Amanda [name], female    DOB: 03-13-1933,  MRN: [id number]  Amanda [name] presents to clinic today for callus(es) plantar aspect of her left foot and painful thick toenails that are difficult to trim. Painful toenails interfere with ambulation. Aggravating factors include wearing enclosed shoe gear. Pain is relieved with periodic professional debridement. Painful calluses are aggravated when weightbearing with and without shoegear. Pain is relieved with periodic professional debridement. She also has a painful porokeratotic lesion on the plantar aspect of her right heel.  Pain prevent comfortable ambulation. Aggravating factor is weightbearing with or without shoegear.  Patient states she has worked on her feet many years working at a Engineer, manufacturing systems, Horticulturist, commercial, and [employer]. Her right foot lesion is most symptomatic today. She denies any redness, drainage or swelling.  PCP is Caren Macadam, MD , and last visit was 04/19/2021.  No Known Allergies  Review of Systems: Negative except as noted in the HPI. Objective:   Constitutional Amanda [name] is a pleasant 87 y.o. African American female, thin build in NAD. AAO x 3.   Vascular CFT <3 seconds b/l LE. Palpable pedal pulses b/l LE. Pedal hair absent. Lower extremity skin temperature gradient within normal limits. No pain with calf compression RLE. No edema noted b/l LE. No cyanosis or clubbing noted.  Neurologic Normal speech. Oriented to person, place, and time. Protective sensation intact 5/5 intact bilaterally with 10g monofilament b/l. Vibratory sensation intact b/l.  Dermatologic Pedal skin with normal turgor, texture and tone b/l lower extremities. No open wounds b/l LE. No interdigital macerations noted b/l LE. Toenails 1-5 b/l elongated, discolored, dystrophic, thickened, crumbly with subungual debris and tenderness to dorsal palpation. Hyperkeratotic lesion(s) submet head 5 left foot.  No erythema, no edema, no  drainage, no fluctuance. Porokeratotic lesion(s) plantar aspect of heel right foot. No erythema, no edema, no drainage, no fluctuance.  Orthopedic: Normal muscle strength 5/5 to all lower extremity muscle groups bilaterally. Hammertoe deformity noted 2-5 b/l. Patient ambulates independent of any assistive aids.   Radiographs: None Assessment:   1. Pain due to onychomycosis of toenails of both feet   2. Porokeratosis   3. Callus   4. Pain in both feet    Plan:  Patient was evaluated and treated and all questions answered. Consent given for treatment as described below: -No new findings. No new orders. -Patient to continue soft, supportive shoe gear daily. -Mycotic toenails were debrided in length and girth with sterile nail nippers and dremel without iatrogenic bleeding. -Callus(es) submet head 5 left foot pared utilizing sterile scalpel blade without complication or incident. Total number debrided =1. -Painful porokeratotic lesion(s) plantar aspect of heel right foot pared and enucleated with sterile scalpel blade without incident. Total number of lesions debrided=1. -Patient/POA to call should there be question/concern in the interim.  Return in about 3 months (around 12/17/2021).  Marzetta Board, DPM

## 2021-09-20 DIAGNOSIS — M79605 Pain in left leg: Secondary | ICD-10-CM | POA: Diagnosis not present

## 2021-09-20 DIAGNOSIS — M6281 Muscle weakness (generalized): Secondary | ICD-10-CM | POA: Diagnosis not present

## 2021-09-28 DIAGNOSIS — M79605 Pain in left leg: Secondary | ICD-10-CM | POA: Diagnosis not present

## 2021-09-28 DIAGNOSIS — M6281 Muscle weakness (generalized): Secondary | ICD-10-CM | POA: Diagnosis not present

## 2021-09-30 DIAGNOSIS — M79605 Pain in left leg: Secondary | ICD-10-CM | POA: Diagnosis not present

## 2021-09-30 DIAGNOSIS — M6281 Muscle weakness (generalized): Secondary | ICD-10-CM | POA: Diagnosis not present

## 2021-10-04 DIAGNOSIS — M6281 Muscle weakness (generalized): Secondary | ICD-10-CM | POA: Diagnosis not present

## 2021-10-04 DIAGNOSIS — M79605 Pain in left leg: Secondary | ICD-10-CM | POA: Diagnosis not present

## 2021-10-06 DIAGNOSIS — M6281 Muscle weakness (generalized): Secondary | ICD-10-CM | POA: Diagnosis not present

## 2021-10-06 DIAGNOSIS — M79605 Pain in left leg: Secondary | ICD-10-CM | POA: Diagnosis not present

## 2021-10-10 DIAGNOSIS — M6281 Muscle weakness (generalized): Secondary | ICD-10-CM | POA: Diagnosis not present

## 2021-10-10 DIAGNOSIS — M79605 Pain in left leg: Secondary | ICD-10-CM | POA: Diagnosis not present

## 2021-10-11 ENCOUNTER — Ambulatory Visit: Payer: Medicare Other | Admitting: Internal Medicine

## 2021-10-11 ENCOUNTER — Encounter: Payer: Self-pay | Admitting: Internal Medicine

## 2021-10-11 ENCOUNTER — Other Ambulatory Visit: Payer: Self-pay

## 2021-10-11 DIAGNOSIS — J432 Centrilobular emphysema: Secondary | ICD-10-CM | POA: Diagnosis not present

## 2021-10-11 MED ORDER — ALBUTEROL SULFATE (2.5 MG/3ML) 0.083% IN NEBU: 2.5000 mg | INHALATION_SOLUTION | Freq: Four times a day (QID) | RESPIRATORY_TRACT | 5 refills | Status: DC | PRN

## 2021-10-11 NOTE — Patient Instructions (Addendum)
Please schedule follow up scheduled with myself in 6 months.  If my schedule is not open yet, we will contact you with a reminder closer to that time.  Continue the albuterol nebulizer treatment as needed.

## 2021-10-11 NOTE — Progress Notes (Signed)
Trudi Morgenthaler    [id number]    1933-02-15  Primary Care Physician:Hagler, Apolonio Schneiders, MD Date of Appointment: 10/11/2021 Established Patient Visit  Chief complaint:   Chief Complaint  Patient presents with   Follow-up    Emphysema     HPI: Amanda [name] is a 87 y.o. woman with shortness of breath and history of tobacco use with centrilobular emphysema.  Interval Updates: No interval hospitalizations or ED visits.  Using albuterol nebulizer treatments about twice a week. Still living independently. She is able to keep up with her house.   No chest pain, sob.  Daughter with her, concerned about her moving too fast. Denies falls.   I have reviewed the patient's family social and past medical history and updated as appropriate.   Past Medical History:  Diagnosis Date   Depression    Hyperlipidemia    Hypertension    Memory disorder    Osteoporosis    PVC (premature ventricular contraction)     Past Surgical History:  Procedure Laterality Date   PARTIAL HYSTERECTOMY      Family History  Problem Relation Age of Onset   Lung cancer Neg Hx     Social History   Occupational History   Not on file  Tobacco Use   Smoking status: Former    Years: 17.00    Types: Cigarettes    Start date: 10/29/1954    Quit date: 10/30/1971    Years since quitting: 49.9   Smokeless tobacco: Never   Tobacco comments:    smoked 3-4/day  Substance and Sexual Activity   Alcohol use: No   Drug use: No   Sexual activity: Not on file     Physical Exam: Blood pressure 110/70, pulse 61, temperature 98 F (36.7 C), temperature source Oral, weight 115 lb 12.8 oz (52.5 kg), SpO2 93 %.  Gen:     Nad, kyphosis Lungs:    ctab no wheezes or crackles CV:         RRR no mrg   Data Reviewed: Imaging: I have personally reviewed the CT Chest Nov 2021 with centrilobular emphysema  PFTs: PFT Results Latest Ref Rng & Units 12/17/2020  FVC-Pre L 1.40  FVC-Predicted Pre % 93   FVC-Post L 1.43  FVC-Predicted Post % 95  Pre FEV1/FVC % % 84  Post FEV1/FCV % % 83  FEV1-Pre L 1.18  FEV1-Predicted Pre % 102  FEV1-Post L 1.19  DLCO uncorrected ml/min/mmHg 10.99  DLCO UNC% % 65  DLCO corrected ml/min/mmHg 10.99  DLCO COR %Predicted % 65  DLVA Predicted % 105  TLC L 3.50  TLC % Predicted % 75  RV % Predicted % 80   I have personally reviewed the patient's PFTs and they show no airflow limitation without a BD response.   Echocardiogram Left Ventricle: Left ventricular ejection fraction, by estimation, is 50  to 55%. The left ventricle has low normal function. The left ventricle has  no regional wall motion abnormalities. The left ventricular internal  cavity size was normal in size.  There is no left ventricular hypertrophy. Left ventricular diastolic  parameters are consistent with Grade I diastolic dysfunction (impaired  relaxation).   Right Ventricle: The right ventricular size is normal. No increase in  right ventricular wall thickness. Right ventricular systolic function is  normal. There is normal pulmonary artery systolic pressure. The tricuspid  regurgitant velocity is 2.75 m/s, and   with an assumed right atrial pressure of  3 mmHg, the estimated right  ventricular systolic pressure is 73.7 mmHg.   Left Atrium: Left atrial size was normal in size.   Right Atrium: Right atrial size was normal in size.   Pericardium: There is no evidence of pericardial effusion.   Mitral Valve: The mitral valve is normal in structure. No evidence of  mitral valve regurgitation. No evidence of mitral valve stenosis.   Tricuspid Valve: The tricuspid valve is normal in structure. Tricuspid  valve regurgitation is moderate . No evidence of tricuspid stenosis.   Aortic Valve: The aortic valve is normal in structure. Aortic valve  regurgitation is mild. Aortic regurgitation PHT measures 1063 msec. Mild  aortic valve sclerosis is present, with no evidence of aortic  valve  stenosis.   Pulmonic Valve: The pulmonic valve was normal in structure. Pulmonic valve  regurgitation is not visualized. No evidence of pulmonic stenosis.   Aorta: The aortic root is normal in size and structure.   Venous: The inferior vena cava is normal in size with greater than 50%  respiratory variability, suggesting right atrial pressure of 3 mmHg.   IAS/Shunts: No atrial level shunt detected by color flow Doppler.   Labs: Lab Results  Component Value Date   WBC 3.6 (L) 08/01/2021   HGB 13.2 08/01/2021   HCT 39.9 08/01/2021   MCV 90.3 08/01/2021   PLT 244 08/01/2021   Lab Results  Component Value Date   NA 135 08/01/2021   K 3.7 08/01/2021   CL 95 (L) 08/01/2021   CO2 30 08/01/2021     Immunization status: Immunization History  Administered Date(s) Administered   Influenza, High Dose Seasonal PF 09/03/2015, 08/22/2016, 10/16/2017, 11/26/2018, 08/01/2019, 09/10/2020, 08/24/2021   Influenza,inj,Quad PF,6+ Mos 01/06/2015   PFIZER(Purple Top)SARS-COV-2 Vaccination 12/17/2019, 01/07/2020, 09/13/2020   Pneumococcal Conjugate-13 10/01/2015, 08/22/2016   Pneumococcal Polysaccharide-23 11/26/2018   Tdap 10/25/2011   Zoster, Live 12/03/2020, 02/24/2021    Assessment:  Centrilobular emphysema Enlarged pulmonary artery Chronic HFrEF, no pulmonary hypertension noted on echocardiogram  Plan/Recommendations:  Mild centrilobular emphysema - recommend prn albuterol nebulizer treatment since she has difficulty with inhaler coordination .  Got her flu shot already.    Return to Care: Return in about 6 months (around 04/10/2022).   Lenice Llamas, MD [facility]monary [facility] [place]

## 2021-10-12 DIAGNOSIS — Z961 Presence of intraocular lens: Secondary | ICD-10-CM | POA: Diagnosis not present

## 2021-10-12 DIAGNOSIS — H401234 Low-tension glaucoma, bilateral, indeterminate stage: Secondary | ICD-10-CM | POA: Diagnosis not present

## 2021-10-12 DIAGNOSIS — H04123 Dry eye syndrome of bilateral lacrimal glands: Secondary | ICD-10-CM | POA: Diagnosis not present

## 2021-10-12 DIAGNOSIS — H1045 Other chronic allergic conjunctivitis: Secondary | ICD-10-CM | POA: Diagnosis not present

## 2021-10-15 DIAGNOSIS — J432 Centrilobular emphysema: Secondary | ICD-10-CM | POA: Diagnosis not present

## 2021-10-17 DIAGNOSIS — G8929 Other chronic pain: Secondary | ICD-10-CM | POA: Diagnosis not present

## 2021-10-17 DIAGNOSIS — D509 Iron deficiency anemia, unspecified: Secondary | ICD-10-CM | POA: Diagnosis not present

## 2021-10-17 DIAGNOSIS — I10 Essential (primary) hypertension: Secondary | ICD-10-CM | POA: Diagnosis not present

## 2021-10-17 DIAGNOSIS — R54 Age-related physical debility: Secondary | ICD-10-CM | POA: Diagnosis not present

## 2021-10-17 DIAGNOSIS — D649 Anemia, unspecified: Secondary | ICD-10-CM | POA: Diagnosis not present

## 2021-10-17 DIAGNOSIS — J432 Centrilobular emphysema: Secondary | ICD-10-CM | POA: Diagnosis not present

## 2021-10-26 DIAGNOSIS — M25562 Pain in left knee: Secondary | ICD-10-CM | POA: Diagnosis not present

## 2021-11-10 DIAGNOSIS — I10 Essential (primary) hypertension: Secondary | ICD-10-CM | POA: Diagnosis not present

## 2021-11-10 DIAGNOSIS — R54 Age-related physical debility: Secondary | ICD-10-CM | POA: Diagnosis not present

## 2021-11-10 DIAGNOSIS — G8929 Other chronic pain: Secondary | ICD-10-CM | POA: Diagnosis not present

## 2021-11-10 DIAGNOSIS — J432 Centrilobular emphysema: Secondary | ICD-10-CM | POA: Diagnosis not present

## 2021-11-10 DIAGNOSIS — D649 Anemia, unspecified: Secondary | ICD-10-CM | POA: Diagnosis not present

## 2021-11-10 DIAGNOSIS — D509 Iron deficiency anemia, unspecified: Secondary | ICD-10-CM | POA: Diagnosis not present

## 2021-11-14 DIAGNOSIS — J432 Centrilobular emphysema: Secondary | ICD-10-CM | POA: Diagnosis not present

## 2021-11-28 ENCOUNTER — Encounter: Payer: Self-pay | Admitting: Adult Health

## 2021-11-28 ENCOUNTER — Ambulatory Visit: Payer: Medicare Other | Admitting: Adult Health

## 2021-11-28 VITALS — BP 109/64 | HR 63 | Ht 60.0 in | Wt 112.2 lb

## 2021-11-28 DIAGNOSIS — F02B0 Dementia in other diseases classified elsewhere, moderate, without behavioral disturbance, psychotic disturbance, mood disturbance, and anxiety: Secondary | ICD-10-CM

## 2021-11-28 DIAGNOSIS — G309 Alzheimer's disease, unspecified: Secondary | ICD-10-CM | POA: Diagnosis not present

## 2021-11-28 NOTE — Patient Instructions (Signed)
Your Plan:  Continue Aricept and Namenda No driving Make sure you are eating a balanced diet and staying well hydrated     Thank you for coming to see [place] at [facility]. I hope we have been able to provide you high quality care today.  You may receive a patient satisfaction survey over the next few weeks. We would appreciate your feedback and comments so that we may continue to improve ourselves and the health of our patients.

## 2021-11-28 NOTE — Progress Notes (Signed)
PATIENT: Amanda [name] DOB: 1933/03/16  REASON FOR VISIT: follow up HISTORY FROM: patient PRIMARY NEUROLOGIST: Dr. Rexene Alberts  HISTORY OF PRESENT ILLNESS: Today 11/28/21:  Amanda [name] is an 88 year old female with a history of memory disturbance.  She returns today for follow-up.  She is here today with her granddaughter.  She reports that she continues to live at home alone.  She does have family that checks on her daily or every other day.  Her medications come in a pill pack.  Her family reminds her of her appointments.  Reports that she has a good appetite.  She reports that she has not been driving because the car was broke but she plans to resume and drive to the post office.  She was advised at her last office visit that she should not be driving.  She returns today for an evaluation.  HISTORY (copied from Dr. Guadelupe Sabin note) 05/16/21: She reports doing fairly well, no recent falls, denies any loss of appetite, weight has been fairly stable within 2 to 3 pounds range.  Her granddaughters check on her.  She has 4 granddaughters, these are Blondine's daughters.  Patient's other daughter is in [place] and visits frequently.  Her daughter in [place] does not have any kids.  Patient drives to her grocery store or post office, she does cook some food on her stove.  She lives alone.  Daughter reports that she was summoned for jury duty for this week but they are going to address this with the court house today.  She is tolerating the memantine 5 mg twice daily.  She has had intermittent dizziness but denies any significant lightheadedness.  Symptoms come and go, she denies any specific symptoms such as spinning sensation.  Sometimes she wonders if it is her nerves and she gets anxious at times.   The patient's allergies, current medications, family history, past medical history, past social history, past surgical history and problem list were reviewed and updated as appropriate.   REVIEW OF SYSTEMS: Out  of a complete 14 system review of symptoms, the patient complains only of the following symptoms, and all other reviewed systems are negative.  ALLERGIES: No Known Allergies  HOME MEDICATIONS: Outpatient Medications Prior to Visit  Medication Sig Dispense Refill   albuterol (PROVENTIL) (2.5 MG/3ML) 0.083% nebulizer solution Take 3 mLs (2.5 mg total) by nebulization every 6 (six) hours as needed for wheezing or shortness of breath. 75 mL 5   amLODipine (NORVASC) 10 MG tablet Take 10 mg by mouth daily.     citalopram (CELEXA) 20 MG tablet Take 20 mg by mouth daily.     donepezil (ARICEPT) 10 MG tablet Take 10 mg by mouth at bedtime.     ferrous sulfate 325 (65 FE) MG tablet Take 325 mg by mouth 2 (two) times daily.     hydrochlorothiazide (HYDRODIURIL) 25 MG tablet Take 25 mg by mouth every morning.     memantine (NAMENDA) 10 MG tablet Take 1 tablet (10 mg total) by mouth 2 (two) times daily. 180 tablet 3   oxyCODONE-acetaminophen (PERCOCET/ROXICET) 5-325 MG tablet Take 1 tablet by mouth every 6 (six) hours as needed for severe pain.     pantoprazole (PROTONIX) 40 MG tablet Take 40 mg by mouth 2 (two) times daily.     vitamin B-12 (CYANOCOBALAMIN) 1000 MCG tablet Take 1,000 mcg by mouth daily.      meloxicam (MOBIC) 15 MG tablet Take 15 mg by mouth daily.  predniSONE (STERAPRED UNI-PAK 21 TAB) 10 MG (21) TBPK tablet Take by mouth as directed.     No facility-administered medications prior to visit.    PAST MEDICAL HISTORY: Past Medical History:  Diagnosis Date   Depression    Hyperlipidemia    Hypertension    Memory disorder    Osteoporosis    PVC (premature ventricular contraction)     PAST SURGICAL HISTORY: Past Surgical History:  Procedure Laterality Date   PARTIAL HYSTERECTOMY      FAMILY HISTORY: Family History  Problem Relation Age of Onset   Lung cancer Neg Hx    Memory loss Neg Hx     SOCIAL HISTORY: Social History   Socioeconomic History   Marital status:  Divorced    Spouse name: Not on file   Number of children: Not on file   Years of education: Not on file   Highest education level: Not on file  Occupational History   Not on file  Tobacco Use   Smoking status: Former    Years: 17.00    Types: Cigarettes    Start date: 10/29/1954    Quit date: 10/30/1971    Years since quitting: 50.1   Smokeless tobacco: Never   Tobacco comments:    smoked 3-4/day  Substance and Sexual Activity   Alcohol use: No   Drug use: No   Sexual activity: Not on file  Other Topics Concern   Not on file  Social History Narrative   Worked in mills.   Divorced 10    Lives alone   Son lives in town and two daughter lives out of town.       Social Determinants of Health   Financial Resource Strain: Not on file  Food Insecurity: Not on file  Transportation Needs: Not on file  Physical Activity: Not on file  Stress: Not on file  Social Connections: Not on file  Intimate Partner Violence: Not on file      PHYSICAL EXAM  Vitals:   11/28/21 1044  BP: 109/64  Pulse: 63  Weight: 112 lb 3.2 oz (50.9 kg)  Height: 5' (1.524 m)   Body mass index is 21.91 kg/m.  MMSE - Mini Mental State Exam 11/28/2021 05/16/2021 08/25/2020  Orientation to time 2 2 3   Orientation to Place 4 4 4   Registration 3 3 3   Attention/ Calculation 1 0 1  Recall 1 1 0  Language- name 2 objects 2 2 2   Language- repeat 0 1 0  Language- follow 3 step command 3 3 3   Language- read & follow direction 1 1 1   Write a sentence 1 1 1   Copy design 0 0 0  Copy design-comments - - -  Total score 18 18 18      Generalized: Well developed, in no acute distress   Neurological examination  Mentation: Alert oriented to time, place, history taking. Follows all commands speech and language fluent Cranial nerve II-XII: Pupils were equal round reactive to light. Extraocular movements were full, visual field were full on confrontational test. Facial sensation and strength were normal.  Uvula tongue midline. Head turning and shoulder shrug  were normal and symmetric. Motor: The motor testing reveals 5 over 5 strength of all 4 extremities. Good symmetric motor tone is noted throughout.  Sensory: Sensory testing is intact to soft touch on all 4 extremities. No evidence of extinction is noted.  Coordination: Cerebellar testing reveals good finger-nose-finger and heel-to-shin bilaterally.  Gait and station: Gait is normal.  Reflexes: Deep tendon reflexes are symmetric and normal bilaterally.   DIAGNOSTIC DATA (LABS, IMAGING, TESTING) - I reviewed patient records, labs, notes, testing and imaging myself where available.  Lab Results  Component Value Date   WBC 3.6 (L) 08/01/2021   HGB 13.2 08/01/2021   HCT 39.9 08/01/2021   MCV 90.3 08/01/2021   PLT 244 08/01/2021      Component Value Date/Time   NA 135 08/01/2021 0347   K 3.7 08/01/2021 0347   CL 95 (L) 08/01/2021 0347   CO2 30 08/01/2021 0347   GLUCOSE 116 (H) 08/01/2021 0347   BUN 17 08/01/2021 0347   CREATININE 0.64 08/01/2021 0347   CALCIUM 8.8 (L) 08/01/2021 0347   PROT 6.7 08/01/2021 0347   ALBUMIN 3.6 08/01/2021 0347   AST 22 08/01/2021 0347   ALT 16 08/01/2021 0347   ALKPHOS 34 (L) 08/01/2021 0347   BILITOT 0.4 08/01/2021 0347   GFRNONAA >60 08/01/2021 0347   GFRAA >60 05/04/2020 1237   Lab Results  Component Value Date   CHOL 167 01/06/2015   HDL 40 01/06/2015   LDLCALC 116 (H) 01/06/2015   TRIG 53 01/06/2015   CHOLHDL 4.2 01/06/2015   Lab Results  Component Value Date   HGBA1C 6.1 (H) 01/06/2015   No results found for: VITAMINB12 Lab Results  Component Value Date   TSH 0.88 03/01/2016      ASSESSMENT AND PLAN 88 y.o. year old female  has a past medical history of Depression, Hyperlipidemia, Hypertension, Memory disorder, Osteoporosis, and PVC (premature ventricular contraction). here with :  1.  Dementia without behavioral disturbance  Continue Aricept 10 mg daily Continue Namenda  10 mg twice a day Advised patient has a recommendation that she should not be operating a motor vehicle.  Patient and granddaughter voiced understanding. Encourage patient to maintain a balanced diet and good hydration. Advised the patient's family that she may need more supervision to ensure that she is eating appropriately and able to perform hygienic needs. Follow-up in 6 months or sooner if needed     Ward Givens, MSN, NP-C 11/28/2021, 10:46 AM [facility] [address] [place], [place] [address] [phone]

## 2021-12-11 ENCOUNTER — Other Ambulatory Visit: Payer: Self-pay | Admitting: Physician Assistant

## 2021-12-13 DIAGNOSIS — J432 Centrilobular emphysema: Secondary | ICD-10-CM | POA: Diagnosis not present

## 2021-12-13 DIAGNOSIS — I10 Essential (primary) hypertension: Secondary | ICD-10-CM | POA: Diagnosis not present

## 2021-12-13 DIAGNOSIS — Z136 Encounter for screening for cardiovascular disorders: Secondary | ICD-10-CM | POA: Diagnosis not present

## 2021-12-13 DIAGNOSIS — E538 Deficiency of other specified B group vitamins: Secondary | ICD-10-CM | POA: Diagnosis not present

## 2021-12-13 DIAGNOSIS — D509 Iron deficiency anemia, unspecified: Secondary | ICD-10-CM | POA: Diagnosis not present

## 2021-12-13 DIAGNOSIS — Z13220 Encounter for screening for lipoid disorders: Secondary | ICD-10-CM | POA: Diagnosis not present

## 2021-12-13 DIAGNOSIS — Z0000 Encounter for general adult medical examination without abnormal findings: Secondary | ICD-10-CM | POA: Diagnosis not present

## 2021-12-14 DIAGNOSIS — D649 Anemia, unspecified: Secondary | ICD-10-CM | POA: Diagnosis not present

## 2021-12-14 DIAGNOSIS — G8929 Other chronic pain: Secondary | ICD-10-CM | POA: Diagnosis not present

## 2021-12-14 DIAGNOSIS — J432 Centrilobular emphysema: Secondary | ICD-10-CM | POA: Diagnosis not present

## 2021-12-14 DIAGNOSIS — I10 Essential (primary) hypertension: Secondary | ICD-10-CM | POA: Diagnosis not present

## 2021-12-14 DIAGNOSIS — D509 Iron deficiency anemia, unspecified: Secondary | ICD-10-CM | POA: Diagnosis not present

## 2021-12-14 DIAGNOSIS — R54 Age-related physical debility: Secondary | ICD-10-CM | POA: Diagnosis not present

## 2021-12-15 DIAGNOSIS — J432 Centrilobular emphysema: Secondary | ICD-10-CM | POA: Diagnosis not present

## 2021-12-30 ENCOUNTER — Encounter: Payer: Self-pay | Admitting: Podiatry

## 2021-12-30 ENCOUNTER — Ambulatory Visit: Payer: Medicare Other | Admitting: Podiatry

## 2021-12-30 ENCOUNTER — Other Ambulatory Visit: Payer: Self-pay

## 2021-12-30 DIAGNOSIS — M79672 Pain in left foot: Secondary | ICD-10-CM | POA: Diagnosis not present

## 2021-12-30 DIAGNOSIS — L84 Corns and callosities: Secondary | ICD-10-CM

## 2021-12-30 DIAGNOSIS — M79671 Pain in right foot: Secondary | ICD-10-CM | POA: Diagnosis not present

## 2021-12-30 DIAGNOSIS — M79675 Pain in left toe(s): Secondary | ICD-10-CM

## 2021-12-30 DIAGNOSIS — B351 Tinea unguium: Secondary | ICD-10-CM | POA: Diagnosis not present

## 2021-12-30 DIAGNOSIS — Q828 Other specified congenital malformations of skin: Secondary | ICD-10-CM | POA: Diagnosis not present

## 2021-12-30 DIAGNOSIS — M79674 Pain in right toe(s): Secondary | ICD-10-CM | POA: Diagnosis not present

## 2022-01-05 NOTE — Progress Notes (Signed)
Subjective: Amanda [name] is a 88 y.o. female patient seen today for follow up of callus(es) left foot and painful thick toenails that are difficult to trim. Painful toenails interfere with ambulation. Aggravating factors include wearing enclosed shoe gear. Pain is relieved with periodic professional debridement. Painful calluses are aggravated when weightbearing with and without shoegear. Pain is relieved with periodic professional debridement. Patient also has painful porokeratotic lesion(s) and painful mycotic toenails that limit ambulation. Painful toenails interfere with ambulation. Aggravating factors include wearing enclosed shoe gear. Pain is relieved with periodic professional debridement. Painful porokeratotic lesions are aggravated when weightbearing with and without shoegear. Pain is relieved with periodic professional debridement..   New problem(s)/concern(s) today: None    PCP is Caren Macadam, MD.   No Known Allergies  Objective: Physical Exam  General: Patient is a pleasant 88 y.o.  female in NAD. AAO x 3.   Neurovascular Examination: CFT <3 seconds b/l LE. Palpable DP pulse(s) b/l LE. Palpable PT pulse(s) b/l LE. Pedal hair absent. No pain with calf compression b/l. Lower extremity skin temperature gradient within normal limits. No edema noted b/l LE. No cyanosis or clubbing noted b/l LE.  Protective sensation intact 5/5 intact bilaterally with 10g monofilament b/l. Vibratory sensation intact b/l.  Dermatological:  Pedal skin is warm and supple b/l LE. No open wounds b/l LE. No interdigital macerations noted b/l LE. Toenails 1-5 b/l elongated, discolored, dystrophic, thickened, crumbly with subungual debris and tenderness to dorsal palpation. Hyperkeratotic lesion(s) submet head 5 left foot.  No erythema, no edema, no drainage, no fluctuance. Porokeratotic lesion(s) plantar heel pad of right foot. No erythema, no edema, no drainage, no fluctuance.  Musculoskeletal:  Normal  muscle strength 5/5 to all lower extremity muscle groups bilaterally. Hammertoe deformity noted 2-5 b/l.Marland Kitchen No pain, crepitus or joint limitation noted with ROM b/l LE.  Patient ambulates independently without assistive aids.  Assessment: 1. Pain due to onychomycosis of toenails of both feet   2. Porokeratosis   3. Callus   4. Pain in both feet    Plan: Patient was evaluated and treated and all questions answered. Consent given for treatment as described below: -Mycotic toenails 1-5 bilaterally were debrided in length and girth with sterile nail nippers and dremel without incident. -Callus(es) submet head 5 left foot pared utilizing sterile scalpel blade without complication or incident. Total number debrided =1. -Painful porokeratotic lesion(s) plantar heel pad of right foot pared and enucleated with sterile scalpel blade without incident. Total number of lesions debrided=1. -Patient/POA to call should there be question/concern in the interim.  Return in about 3 months (around 03/29/2022).  Marzetta Board, DPM

## 2022-01-10 ENCOUNTER — Other Ambulatory Visit: Payer: Self-pay | Admitting: Internal Medicine

## 2022-01-10 DIAGNOSIS — J432 Centrilobular emphysema: Secondary | ICD-10-CM

## 2022-01-11 DIAGNOSIS — E785 Hyperlipidemia, unspecified: Secondary | ICD-10-CM | POA: Diagnosis not present

## 2022-01-11 DIAGNOSIS — G8929 Other chronic pain: Secondary | ICD-10-CM | POA: Diagnosis not present

## 2022-01-11 DIAGNOSIS — I10 Essential (primary) hypertension: Secondary | ICD-10-CM | POA: Diagnosis not present

## 2022-01-15 DIAGNOSIS — J432 Centrilobular emphysema: Secondary | ICD-10-CM | POA: Diagnosis not present

## 2022-02-08 ENCOUNTER — Telehealth: Payer: Self-pay | Admitting: Adult Health

## 2022-02-08 DIAGNOSIS — I10 Essential (primary) hypertension: Secondary | ICD-10-CM | POA: Diagnosis not present

## 2022-02-08 NOTE — Telephone Encounter (Signed)
Amanda [name] @ [facility] has called to report pt has had an increase in dizziness and feeling of being light headed.  The clinical pharmacist Ernst Bowler wants to know if pt coming off of memantine (NAMENDA) 5 MG tablet is an option for relief. ?

## 2022-02-08 NOTE — Telephone Encounter (Signed)
I [facility] has called to report pt has had an increase in dizziness and feeling of being light headed.  The clinical pharmacist Ernst Bowler wants to know if pt coming off of memantine (NAMENDA) 5 MG tablet is an option for relief.  I relayed that per MM/NP can try and see if anything changes.  I relayed to Joellen Jersey that I would call grand daughter and let her know.  I spoke to her and relayed plan.  She was appreciative and verbalized understanding.  ?

## 2022-02-08 NOTE — Telephone Encounter (Signed)
Ok certainly try and see if symptoms get worse ?

## 2022-03-01 ENCOUNTER — Telehealth: Payer: Self-pay | Admitting: Internal Medicine

## 2022-03-01 MED ORDER — AEROCHAMBER MV MISC: 0 refills | Status: DC

## 2022-03-01 NOTE — Telephone Encounter (Signed)
Called and spoke with pt's granddaughter Milagros Loll letting her know that we could send an Rx for spacer to the pharmacy for pt so she could be able to get better use of her inhalers and she verbalized understanding. Rx sent to preferred pharmacy. Nothing further needed. ?

## 2022-03-10 DIAGNOSIS — G8929 Other chronic pain: Secondary | ICD-10-CM | POA: Diagnosis not present

## 2022-03-10 DIAGNOSIS — I10 Essential (primary) hypertension: Secondary | ICD-10-CM | POA: Diagnosis not present

## 2022-03-10 DIAGNOSIS — E785 Hyperlipidemia, unspecified: Secondary | ICD-10-CM | POA: Diagnosis not present

## 2022-03-29 ENCOUNTER — Ambulatory Visit: Payer: Medicare Other | Admitting: Podiatry

## 2022-03-29 ENCOUNTER — Encounter: Payer: Self-pay | Admitting: Podiatry

## 2022-03-29 DIAGNOSIS — M79671 Pain in right foot: Secondary | ICD-10-CM | POA: Diagnosis not present

## 2022-03-29 DIAGNOSIS — L84 Corns and callosities: Secondary | ICD-10-CM | POA: Diagnosis not present

## 2022-03-29 DIAGNOSIS — Q828 Other specified congenital malformations of skin: Secondary | ICD-10-CM

## 2022-03-29 DIAGNOSIS — M79672 Pain in left foot: Secondary | ICD-10-CM

## 2022-03-29 DIAGNOSIS — M79675 Pain in left toe(s): Secondary | ICD-10-CM | POA: Diagnosis not present

## 2022-03-29 DIAGNOSIS — M79674 Pain in right toe(s): Secondary | ICD-10-CM | POA: Diagnosis not present

## 2022-03-29 DIAGNOSIS — B351 Tinea unguium: Secondary | ICD-10-CM

## 2022-03-31 ENCOUNTER — Ambulatory Visit: Payer: Medicare Other | Admitting: Podiatry

## 2022-04-07 NOTE — Progress Notes (Signed)
  Subjective:  Patient ID: Amanda [name], female    DOB: 10/11/1933,  MRN: [id number]  Amanda [name] presents to clinic today for callus left foot, painful porokeratotic lesion(s) right heel and painful mycotic toenails that limit ambulation. Painful toenails interfere with ambulation. Aggravating factors include wearing enclosed shoe gear. Pain is relieved with periodic professional debridement. Painful porokeratotic lesion and callus are aggravated when weightbearing with and without shoegear. Pain is relieved with periodic professional debridement.  New problem(s): None.   PCP is Caren Macadam, MD , and last visit was December 13, 2021.  No Known Allergies  Review of Systems: Negative except as noted in the HPI.  Objective: No changes noted in today's physical examination. General: Patient is a pleasant 88 y.o. female in NAD. AAO x 3.   Neurovascular Examination: CFT <3 seconds b/l LE. Palpable DP pulse(s) b/l LE. Palpable PT pulse(s) b/l LE. Pedal hair absent. No pain with calf compression b/l. Lower extremity skin temperature gradient within normal limits. No edema noted b/l LE. No cyanosis or clubbing noted b/l LE.  Protective sensation intact 5/5 intact bilaterally with 10g monofilament b/l. Vibratory sensation intact b/l.  Dermatological:  Pedal skin is warm and supple b/l LE. No open wounds b/l LE. No interdigital macerations noted b/l LE. Toenails 1-5 b/l elongated, discolored, dystrophic, thickened, crumbly with subungual debris and tenderness to dorsal palpation. Hyperkeratotic lesion(s) submet head 5 left foot.  No erythema, no edema, no drainage, no fluctuance. Porokeratotic lesion(s) plantar heel pad of right foot. No erythema, no edema, no drainage, no fluctuance.  Musculoskeletal:  Normal muscle strength 5/5 to all lower extremity muscle groups bilaterally. Hammertoe deformity noted 2-5 b/l.Marland Kitchen No pain, crepitus or joint limitation noted with ROM b/l LE.  Patient ambulates  independently without assistive aids.  Assessment/Plan: 1. Pain due to onychomycosis of toenails of both feet   2. Porokeratosis   3. Callus   4. Pain in both feet     -Examined patient. -No new findings. No new orders. -Toenails 1-5 b/l were debrided in length and girth with sterile nail nippers and dremel without iatrogenic bleeding.  -Callus(es) submet head 5 left foot pared utilizing sterile scalpel blade without complication or incident. Total number debrided =1. -Porokeratotic lesion(s) plantar heel pad of right foot pared and enucleated with sterile scalpel blade without incident. Total number of lesions debrided=1. -Patient/POA to call should there be question/concern in the interim.   Return in about 3 months (around 06/29/2022).  Marzetta Board, DPM

## 2022-04-12 DIAGNOSIS — I10 Essential (primary) hypertension: Secondary | ICD-10-CM | POA: Diagnosis not present

## 2022-04-12 DIAGNOSIS — R7303 Prediabetes: Secondary | ICD-10-CM | POA: Diagnosis not present

## 2022-04-12 DIAGNOSIS — D649 Anemia, unspecified: Secondary | ICD-10-CM | POA: Diagnosis not present

## 2022-05-11 DIAGNOSIS — E7800 Pure hypercholesterolemia, unspecified: Secondary | ICD-10-CM | POA: Diagnosis not present

## 2022-05-11 DIAGNOSIS — I10 Essential (primary) hypertension: Secondary | ICD-10-CM | POA: Diagnosis not present

## 2022-05-16 DIAGNOSIS — E7800 Pure hypercholesterolemia, unspecified: Secondary | ICD-10-CM | POA: Diagnosis not present

## 2022-05-16 DIAGNOSIS — Z8719 Personal history of other diseases of the digestive system: Secondary | ICD-10-CM | POA: Diagnosis not present

## 2022-05-16 DIAGNOSIS — D509 Iron deficiency anemia, unspecified: Secondary | ICD-10-CM | POA: Diagnosis not present

## 2022-05-16 DIAGNOSIS — I10 Essential (primary) hypertension: Secondary | ICD-10-CM | POA: Diagnosis not present

## 2022-05-16 DIAGNOSIS — J432 Centrilobular emphysema: Secondary | ICD-10-CM | POA: Diagnosis not present

## 2022-05-16 DIAGNOSIS — I700 Atherosclerosis of aorta: Secondary | ICD-10-CM | POA: Diagnosis not present

## 2022-06-05 ENCOUNTER — Ambulatory Visit: Payer: Medicare Other | Admitting: Adult Health

## 2022-06-15 DIAGNOSIS — E538 Deficiency of other specified B group vitamins: Secondary | ICD-10-CM | POA: Diagnosis not present

## 2022-06-15 DIAGNOSIS — L819 Disorder of pigmentation, unspecified: Secondary | ICD-10-CM | POA: Diagnosis not present

## 2022-06-15 DIAGNOSIS — M25511 Pain in right shoulder: Secondary | ICD-10-CM | POA: Diagnosis not present

## 2022-06-15 DIAGNOSIS — I10 Essential (primary) hypertension: Secondary | ICD-10-CM | POA: Diagnosis not present

## 2022-06-26 DIAGNOSIS — M542 Cervicalgia: Secondary | ICD-10-CM | POA: Diagnosis not present

## 2022-06-26 DIAGNOSIS — M25511 Pain in right shoulder: Secondary | ICD-10-CM | POA: Diagnosis not present

## 2022-06-30 ENCOUNTER — Encounter: Payer: Self-pay | Admitting: Podiatry

## 2022-06-30 ENCOUNTER — Ambulatory Visit (INDEPENDENT_AMBULATORY_CARE_PROVIDER_SITE_OTHER): Payer: Medicare Other | Admitting: Podiatry

## 2022-06-30 DIAGNOSIS — M79675 Pain in left toe(s): Secondary | ICD-10-CM

## 2022-06-30 DIAGNOSIS — B351 Tinea unguium: Secondary | ICD-10-CM | POA: Diagnosis not present

## 2022-06-30 DIAGNOSIS — M79674 Pain in right toe(s): Secondary | ICD-10-CM

## 2022-07-06 NOTE — Progress Notes (Signed)
  Subjective:  Patient ID: Amanda [name], female    DOB: 07/15/1933,  MRN: [id number]  Kellyn Mccary presents to clinic today for corn(s)  right lower extremity, porokeratotic lesion(s) right lower extremity and painful mycotic nails. Painful toenails interfere with ambulation. Aggravating factors include wearing enclosed shoe gear. Pain is relieved with periodic professional debridement. Painful corns and porokeratotic lesion(s) aggravated when weightbearing with and without shoegear. Pain is relieved with periodic professional debridement.  New problem(s): None.   PCP is Caren Macadam, MD , and last visit was June 20, 2022.  No Known Allergies  Review of Systems: Negative except as noted in the HPI.  Objective: No changes noted in today's physical examination. [name] is an 88 y.o. female, WD, WN in NAD. AAO x 2.  Vascular Examination: Palpable pedal pulses b/l LE.  No pedal edema b/l. Skin temperature gradient WNL b/l. No varicosities b/l. CFT <3 seconds b/l LE. Pedal hair absent..  Dermatological Examination: Pedal skin with normal turgor, texture and tone b/l. No open wounds. No interdigital macerations b/l. Toenails 1-5 b/l thickened, discolored, dystrophic with subungual debris. There is pain on palpation to dorsal aspect of nailplates. Hyperkeratotic lesion(s) distal tip of right 4th toe.  No erythema, no edema, no drainage, no fluctuance. Porokeratotic lesion(s) plantar heel pad of right foot. No erythema, no edema, no drainage, no fluctuance..  Neurological Examination: Protective sensation intact with 10 gram monofilament b/l LE. Vibratory sensation intact b/l LE.   Musculoskeletal Examination: Muscle strength 5/5 to all LE muscle groups b/l. Hammertoe deformity noted 2-5 b/l. Patient ambulates independent of any assistive aids.  Assessment/Plan: 1. Pain due to onychomycosis of toenails of both feet   -Patient was evaluated and treated. All patient's and/or POA's  questions/concerns answered on today's visit. -Patient to continue soft, supportive shoe gear daily. -Mycotic toenails 1-5 bilaterally were debrided in length and girth with sterile nail nippers and dremel without incident. -As a courtesy, corn(s)  distal tip of right 4th toe gently filed without complication or incident. Total number pared=1. -As a courtesy, porokeratotic lesion(s) plantar heel pad of right foot pared and enucleated without complication or incident. Total number pared=1. -Dispensed tube foam. Apply to R 4th toe every morning. Remove every evening. -Patient/POA to call should there be question/concern in the interim.   Return in about 3 months (around 09/30/2022).  Marzetta Board, DPM

## 2022-07-07 ENCOUNTER — Ambulatory Visit: Payer: Medicare Other | Admitting: Adult Health

## 2022-07-07 ENCOUNTER — Encounter: Payer: Self-pay | Admitting: Adult Health

## 2022-07-07 VITALS — BP 115/56 | HR 43 | Ht 60.0 in | Wt 113.4 lb

## 2022-07-07 DIAGNOSIS — G309 Alzheimer's disease, unspecified: Secondary | ICD-10-CM

## 2022-07-07 DIAGNOSIS — F02B0 Dementia in other diseases classified elsewhere, moderate, without behavioral disturbance, psychotic disturbance, mood disturbance, and anxiety: Secondary | ICD-10-CM

## 2022-07-07 NOTE — Patient Instructions (Addendum)
Your Plan:  Continue Aricept 10 mg at bedtime Can consider trying namenda ok at low dose 5 mg at bedtime.  If your symptoms worsen or you develop new symptoms please let us know.    Thank you for coming to see [place] at [facility]. I hope we have been able to provide you high quality care today.  You may receive a patient satisfaction survey over the next few weeks. We would appreciate your feedback and comments so that we may continue to improve ourselves and the health of our patients.

## 2022-07-07 NOTE — Progress Notes (Signed)
PATIENT: Amanda [name] DOB: [birth year]  REASON FOR VISIT: follow up HISTORY FROM: patient, granddaughter  PRIMARY NEUROLOGIST: Dr. Rexene Alberts  HISTORY OF PRESENT ILLNESS: Today 07/07/22:: Amanda [name] is an 88 year old female with a history of memory disturbance.  She returns today for follow-up.  She reports that her memory has remained stable.  She Lives home alone. Able to complete all ADLs independently. Able to prepare her own meals. She manages her medications- uses a pill pack. If not in the pack she will forget to take. Good appetite. Sleeping ok. Family reports that she can be short-tempered at times. Denies hallucinations.  She was on Namenda but was having dizziness.  Medication was discontinued.  Granddaughter reports that she still complains of dizziness not sure that the medication was the cause.  11/28/2021: Amanda [name] is an 88 year old female with a history of memory disturbance.  She returns today for follow-up.  She is here today with her granddaughter.  She reports that she continues to live at home alone.  She does have family that checks on her daily or every other day.  Her medications come in a pill pack.  Her family reminds her of her appointments.  Reports that she has a good appetite.  She reports that she has not been driving because the car was broke but she plans to resume and drive to the post office.  She was advised at her last office visit that she should not be driving.  She returns today for an evaluation.  HISTORY (copied from Dr. Guadelupe Sabin note) 05/16/21: She reports doing fairly well, no recent falls, denies any loss of appetite, weight has been fairly stable within 2 to 3 pounds range.  Her granddaughters check on her.  She has 4 granddaughters, these are Blondine's daughters.  Patient's other daughter is in [place] and visits frequently.  Her daughter in [place] does not have any kids.  Patient drives to her grocery store or post office, she does cook some food on her  stove.  She lives alone.  Daughter reports that she was summoned for jury duty for this week but they are going to address this with the court house today.  She is tolerating the memantine 5 mg twice daily.  She has had intermittent dizziness but denies any significant lightheadedness.  Symptoms come and go, she denies any specific symptoms such as spinning sensation.  Sometimes she wonders if it is her nerves and she gets anxious at times.   The patient's allergies, current medications, family history, past medical history, past social history, past surgical history and problem list were reviewed and updated as appropriate.   REVIEW OF SYSTEMS: Out of a complete 14 system review of symptoms, the patient complains only of the following symptoms, and all other reviewed systems are negative.  ALLERGIES: No Known Allergies  HOME MEDICATIONS: Outpatient Medications Prior to Visit  Medication Sig Dispense Refill   albuterol (PROVENTIL) (2.5 MG/3ML) 0.083% nebulizer solution Take 3 mLs (2.5 mg total) by nebulization every 6 (six) hours as needed for wheezing or shortness of breath. 75 mL 5   amLODipine (NORVASC) 10 MG tablet Take 10 mg by mouth daily.     ASPIRIN PO Take 81 mg by mouth daily.     citalopram (CELEXA) 20 MG tablet Take 20 mg by mouth daily.     donepezil (ARICEPT) 10 MG tablet Take 10 mg by mouth at bedtime.     hydrochlorothiazide (HYDRODIURIL) 25 MG tablet Take 25 mg by  mouth every morning.     latanoprost (XALATAN) 0.005 % ophthalmic solution 1 drop into each eye in the evening     Multiple Vitamins-Minerals (CENTRUM SILVER PO) Take 1 tablet by mouth daily.     pantoprazole (PROTONIX) 40 MG tablet Take 40 mg by mouth 2 (two) times daily.     amLODipine (NORVASC) 10 MG tablet Take 1 tablet by mouth daily.     Cyanocobalamin (VITAMIN B12) 1000 MCG TBCR 1 tablet     meloxicam (MOBIC) 15 MG tablet Take 15 mg by mouth daily.     memantine (NAMENDA) 10 MG tablet Take 1 tablet (10 mg  total) by mouth 2 (two) times daily. 180 tablet 3   oxyCODONE-acetaminophen (PERCOCET/ROXICET) 5-325 MG tablet Take 1 tablet by mouth every 6 (six) hours as needed for severe pain.     Spacer/Aero-Holding Chambers (AEROCHAMBER MV) inhaler Use as instructed 1 each 0   vitamin B-12 (CYANOCOBALAMIN) 1000 MCG tablet Take 1,000 mcg by mouth daily.      No facility-administered medications prior to visit.    PAST MEDICAL HISTORY: Past Medical History:  Diagnosis Date   Depression    Hyperlipidemia    Hypertension    Memory disorder    Osteoporosis    PVC (premature ventricular contraction)     PAST SURGICAL HISTORY: Past Surgical History:  Procedure Laterality Date   PARTIAL HYSTERECTOMY      FAMILY HISTORY: Family History  Problem Relation Age of Onset   Lung cancer Neg Hx    Memory loss Neg Hx     SOCIAL HISTORY: Social History   Socioeconomic History   Marital status: Divorced    Spouse name: Not on file   Number of children: Not on file   Years of education: Not on file   Highest education level: Not on file  Occupational History   Not on file  Tobacco Use   Smoking status: Former    Years: 17.00    Types: Cigarettes    Start date: 10/29/1954    Quit date: 10/30/1971    Years since quitting: 50.7   Smokeless tobacco: Never   Tobacco comments:    smoked 3-4/day  Substance and Sexual Activity   Alcohol use: No   Drug use: No   Sexual activity: Not on file  Other Topics Concern   Not on file  Social History Narrative   Worked in mills.   Divorced 10    Lives alone   Son lives in town and two daughter lives out of town.       Social Determinants of Health   Financial Resource Strain: Not on file  Food Insecurity: Not on file  Transportation Needs: Not on file  Physical Activity: Not on file  Stress: Not on file  Social Connections: Not on file  Intimate Partner Violence: Not on file      PHYSICAL EXAM  Vitals:   07/07/22 0936  BP: (!)  115/56  Pulse: (!) 43  Weight: 113 lb 6.4 oz (51.4 kg)  Height: 5' (1.524 m)   Body mass index is 22.15 kg/m.     07/07/2022    9:38 AM 11/28/2021   10:47 AM 05/16/2021   12:52 PM  MMSE - Mini Mental State Exam  Orientation to time '2 2 2  '$ Orientation to Place '4 4 4  '$ Registration '3 3 3  '$ Attention/ Calculation 0 1 0  Recall 0 1 1  Language- name 2 objects 2 2 2  Language- repeat 1 0 1  Language- follow 3 step command '3 3 3  '$ Language- read & follow direction '1 1 1  '$ Write a sentence '1 1 1  '$ Copy design 1 0 0  Total score '18 18 18     '$ Generalized: Well developed, in no acute distress   Neurological examination  Mentation: Alert oriented to time, place, history taking. Follows all commands speech and language fluent Cranial nerve II-XII: Pupils were equal round reactive to light. Extraocular movements were full, visual field were full on confrontational test. Facial sensation and strength were normal. Uvula tongue midline. Head turning and shoulder shrug  were normal and symmetric. Motor: The motor testing reveals 5 over 5 strength of all 4 extremities. Good symmetric motor tone is noted throughout.  Sensory: Sensory testing is intact to soft touch on all 4 extremities. No evidence of extinction is noted.  Coordination: Cerebellar testing reveals good finger-nose-finger and heel-to-shin bilaterally.  Gait and station: Gait is normal. Reflexes: Deep tendon reflexes are symmetric and normal bilaterally.   DIAGNOSTIC DATA (LABS, IMAGING, TESTING) - I reviewed patient records, labs, notes, testing and imaging myself where available.  Lab Results  Component Value Date   WBC 3.6 (L) 08/01/2021   HGB 13.2 08/01/2021   HCT 39.9 08/01/2021   MCV 90.3 08/01/2021   PLT 244 08/01/2021      Component Value Date/Time   NA 135 08/01/2021 0347   K 3.7 08/01/2021 0347   CL 95 (L) 08/01/2021 0347   CO2 30 08/01/2021 0347   GLUCOSE 116 (H) 08/01/2021 0347   BUN 17 08/01/2021 0347    CREATININE 0.64 08/01/2021 0347   CALCIUM 8.8 (L) 08/01/2021 0347   PROT 6.7 08/01/2021 0347   ALBUMIN 3.6 08/01/2021 0347   AST 22 08/01/2021 0347   ALT 16 08/01/2021 0347   ALKPHOS 34 (L) 08/01/2021 0347   BILITOT 0.4 08/01/2021 0347   GFRNONAA >60 08/01/2021 0347   GFRAA >60 05/04/2020 1237   Lab Results  Component Value Date   CHOL 167 01/06/2015   HDL 40 01/06/2015   LDLCALC 116 (H) 01/06/2015   TRIG 53 01/06/2015   CHOLHDL 4.2 01/06/2015   Lab Results  Component Value Date   HGBA1C 6.1 (H) 01/06/2015   No results found for: "VITAMINB12" Lab Results  Component Value Date   TSH 0.88 03/01/2016      ASSESSMENT AND PLAN 88 y.o. year old female  has a past medical history of Depression, Hyperlipidemia, Hypertension, Memory disorder, Osteoporosis, and PVC (premature ventricular contraction). here with :  1.  Dementia without behavioral disturbance  Continue Aricept 10 mg daily  Not sure that Namenda caused her dizziness.  Advised that if she wants to restart this medication we can do low-dose at 5 mg at bedtime.  Granddaughter will discuss with her family. Advised if symptoms worsen or she develops new symptoms she should let us know Follow-up in 6 months or sooner if needed     Ward Givens, MSN, NP-C 07/07/2022, 9:48 AM [facility] [address] [place], [place] [address] [phone]

## 2022-07-19 DIAGNOSIS — L818 Other specified disorders of pigmentation: Secondary | ICD-10-CM | POA: Diagnosis not present

## 2022-09-08 DIAGNOSIS — E7800 Pure hypercholesterolemia, unspecified: Secondary | ICD-10-CM | POA: Diagnosis not present

## 2022-09-08 DIAGNOSIS — I10 Essential (primary) hypertension: Secondary | ICD-10-CM | POA: Diagnosis not present

## 2022-09-15 DIAGNOSIS — E538 Deficiency of other specified B group vitamins: Secondary | ICD-10-CM | POA: Diagnosis not present

## 2022-09-15 DIAGNOSIS — Z23 Encounter for immunization: Secondary | ICD-10-CM | POA: Diagnosis not present

## 2022-09-15 DIAGNOSIS — D509 Iron deficiency anemia, unspecified: Secondary | ICD-10-CM | POA: Diagnosis not present

## 2022-09-15 DIAGNOSIS — I10 Essential (primary) hypertension: Secondary | ICD-10-CM | POA: Diagnosis not present

## 2022-10-04 DIAGNOSIS — E7800 Pure hypercholesterolemia, unspecified: Secondary | ICD-10-CM | POA: Diagnosis not present

## 2022-10-04 DIAGNOSIS — I10 Essential (primary) hypertension: Secondary | ICD-10-CM | POA: Diagnosis not present

## 2022-10-10 ENCOUNTER — Ambulatory Visit: Payer: Medicare Other | Admitting: Podiatry

## 2022-10-10 ENCOUNTER — Encounter: Payer: Self-pay | Admitting: Podiatry

## 2022-10-10 DIAGNOSIS — B351 Tinea unguium: Secondary | ICD-10-CM

## 2022-10-10 DIAGNOSIS — L84 Corns and callosities: Secondary | ICD-10-CM

## 2022-10-10 DIAGNOSIS — Q828 Other specified congenital malformations of skin: Secondary | ICD-10-CM

## 2022-10-10 DIAGNOSIS — M79674 Pain in right toe(s): Secondary | ICD-10-CM | POA: Diagnosis not present

## 2022-10-10 DIAGNOSIS — M79671 Pain in right foot: Secondary | ICD-10-CM | POA: Diagnosis not present

## 2022-10-10 DIAGNOSIS — M79675 Pain in left toe(s): Secondary | ICD-10-CM | POA: Diagnosis not present

## 2022-10-10 DIAGNOSIS — M79672 Pain in left foot: Secondary | ICD-10-CM | POA: Diagnosis not present

## 2022-10-10 DIAGNOSIS — I739 Peripheral vascular disease, unspecified: Secondary | ICD-10-CM

## 2022-10-10 NOTE — Progress Notes (Signed)
  Subjective:  Patient ID: Amanda [name], female    DOB: 01/08/1933,  MRN: [id number]  Amanda [name] presents to clinic today for at risk foot care. Patient has h/o PAD and painful porokeratotic lesion(s) b/l lower extremities and painful mycotic toenails that limit ambulation. Painful toenails interfere with ambulation. Aggravating factors include wearing enclosed shoe gear. Pain is relieved with periodic professional debridement. Painful porokeratotic lesions are aggravated when weightbearing with and without shoegear. Pain is relieved with periodic professional debridement.  Chief Complaint  Patient presents with   foot care    Patient is here for routine foot care, she is not diabetic,patient lastseen pcp 2 weeks ago not clear what her name is at this time.   New problem(s): None.   PCP is Caren Macadam, MD. Last visit two weeks ago.  No Known Allergies  Review of Systems: Negative except as noted in the HPI.  Objective: No changes noted in today's physical examination.  Amanda [name] is a pleasant 88 y.o. female in NAD. AAO x 3.  Vascular Examination: Palpable pedal pulses b/l LE.  No pedal edema b/l. Skin temperature gradient WNL b/l. No varicosities b/l. CFT <3 seconds b/l LE. Pedal hair absent.  Dermatological Examination: Pedal skin with normal turgor, texture and tone b/l. No open wounds. No interdigital macerations b/l. Toenails 1-5 b/l thickened, discolored, dystrophic with subungual debris. There is pain on palpation to dorsal aspect of nailplates.   Hyperkeratotic lesion(s) distal tip of right 4th toe.  No erythema, no edema, no drainage, no fluctuance.   Porokeratotic lesion(s) plantar heel pad of right foot, submet head 1 right foot, submet head 5 left foot. No erythema, no edema, no drainage, no fluctuance.  Neurological Examination: Protective sensation intact with 10 gram monofilament b/l LE. Vibratory sensation intact b/l LE.   Musculoskeletal  Examination: Muscle strength 5/5 to all LE muscle groups b/l. Hammertoe deformity noted 2-5 b/l. Patient ambulates independent of any assistive aids.  Assessment/Plan: 1. Pain due to onychomycosis of toenails of both feet   2. Porokeratosis   3. Callus   4. Pain in both feet     No orders of the defined types were placed in this encounter.   -Consent given for treatment as described below: -Continue supportive shoe gear daily. -Toenails 1-5 b/l were debrided in length and girth with sterile nail nippers and dremel without iatrogenic bleeding.  -Corn(s) R 4th toe pared utilizing sterile scalpel blade without complication or incident. Total number debrided=1. -Porokeratotic lesion(s) right heel, submet head 1 right foot, and submet head 5 left foot pared and enucleated with sterile currette without incident. Total number of lesions debrided=3. -Patient/POA to call should there be question/concern in the interim.   Return in about 3 months (around 01/10/2023).  Marzetta Board, DPM

## 2022-10-18 ENCOUNTER — Ambulatory Visit: Payer: Medicare Other | Admitting: Podiatry

## 2022-11-30 ENCOUNTER — Observation Stay (HOSPITAL_BASED_OUTPATIENT_CLINIC_OR_DEPARTMENT_OTHER)
Admission: EM | Admit: 2022-11-30 | Discharge: 2022-12-02 | Disposition: A | Discharge status: Home / Self Care | Payer: Medicare Other | Attending: Internal Medicine | Admitting: Internal Medicine

## 2022-11-30 ENCOUNTER — Encounter (HOSPITAL_BASED_OUTPATIENT_CLINIC_OR_DEPARTMENT_OTHER): Payer: Self-pay | Admitting: Pediatrics

## 2022-11-30 ENCOUNTER — Other Ambulatory Visit: Payer: Self-pay

## 2022-11-30 ENCOUNTER — Emergency Department (HOSPITAL_BASED_OUTPATIENT_CLINIC_OR_DEPARTMENT_OTHER): Payer: Medicare Other

## 2022-11-30 DIAGNOSIS — I10 Essential (primary) hypertension: Secondary | ICD-10-CM | POA: Diagnosis present

## 2022-11-30 DIAGNOSIS — R4182 Altered mental status, unspecified: Secondary | ICD-10-CM | POA: Diagnosis not present

## 2022-11-30 DIAGNOSIS — J811 Chronic pulmonary edema: Secondary | ICD-10-CM | POA: Diagnosis not present

## 2022-11-30 DIAGNOSIS — R0600 Dyspnea, unspecified: Secondary | ICD-10-CM | POA: Diagnosis not present

## 2022-11-30 DIAGNOSIS — Z1152 Encounter for screening for COVID-19: Secondary | ICD-10-CM | POA: Insufficient documentation

## 2022-11-30 DIAGNOSIS — D509 Iron deficiency anemia, unspecified: Secondary | ICD-10-CM | POA: Diagnosis present

## 2022-11-30 DIAGNOSIS — N390 Urinary tract infection, site not specified: Secondary | ICD-10-CM | POA: Insufficient documentation

## 2022-11-30 DIAGNOSIS — R9431 Abnormal electrocardiogram [ECG] [EKG]: Secondary | ICD-10-CM | POA: Diagnosis not present

## 2022-11-30 DIAGNOSIS — R079 Chest pain, unspecified: Secondary | ICD-10-CM | POA: Diagnosis not present

## 2022-11-30 DIAGNOSIS — Z79899 Other long term (current) drug therapy: Secondary | ICD-10-CM | POA: Insufficient documentation

## 2022-11-30 DIAGNOSIS — I2609 Other pulmonary embolism with acute cor pulmonale: Secondary | ICD-10-CM | POA: Diagnosis not present

## 2022-11-30 DIAGNOSIS — F0390 Unspecified dementia without behavioral disturbance: Secondary | ICD-10-CM | POA: Diagnosis not present

## 2022-11-30 DIAGNOSIS — R1084 Generalized abdominal pain: Secondary | ICD-10-CM | POA: Insufficient documentation

## 2022-11-30 DIAGNOSIS — Z86711 Personal history of pulmonary embolism: Secondary | ICD-10-CM | POA: Diagnosis present

## 2022-11-30 DIAGNOSIS — I700 Atherosclerosis of aorta: Secondary | ICD-10-CM | POA: Diagnosis not present

## 2022-11-30 DIAGNOSIS — R1013 Epigastric pain: Secondary | ICD-10-CM | POA: Insufficient documentation

## 2022-11-30 DIAGNOSIS — R0602 Shortness of breath: Secondary | ICD-10-CM | POA: Insufficient documentation

## 2022-11-30 DIAGNOSIS — I2699 Other pulmonary embolism without acute cor pulmonale: Secondary | ICD-10-CM | POA: Diagnosis not present

## 2022-11-30 DIAGNOSIS — R0789 Other chest pain: Secondary | ICD-10-CM | POA: Diagnosis present

## 2022-11-30 DIAGNOSIS — D3502 Benign neoplasm of left adrenal gland: Secondary | ICD-10-CM | POA: Diagnosis not present

## 2022-11-30 DIAGNOSIS — R911 Solitary pulmonary nodule: Secondary | ICD-10-CM | POA: Diagnosis present

## 2022-11-30 DIAGNOSIS — R42 Dizziness and giddiness: Secondary | ICD-10-CM | POA: Diagnosis not present

## 2022-11-30 DIAGNOSIS — E785 Hyperlipidemia, unspecified: Secondary | ICD-10-CM | POA: Diagnosis present

## 2022-11-30 DIAGNOSIS — E876 Hypokalemia: Secondary | ICD-10-CM | POA: Diagnosis not present

## 2022-11-30 DIAGNOSIS — I739 Peripheral vascular disease, unspecified: Secondary | ICD-10-CM

## 2022-11-30 DIAGNOSIS — K5730 Diverticulosis of large intestine without perforation or abscess without bleeding: Secondary | ICD-10-CM | POA: Diagnosis not present

## 2022-11-30 LAB — URINALYSIS, MICROSCOPIC (REFLEX)

## 2022-11-30 LAB — CBC
HCT: 40.4 % (ref 36.0–46.0)
Hemoglobin: 13.2 g/dL (ref 12.0–15.0)
MCH: 29.8 pg (ref 26.0–34.0)
MCHC: 32.7 g/dL (ref 30.0–36.0)
MCV: 91.2 fL (ref 80.0–100.0)
Platelets: 311 10*3/uL (ref 150–400)
RBC: 4.43 MIL/uL (ref 3.87–5.11)
RDW: 14.1 % (ref 11.5–15.5)
WBC: 4.7 10*3/uL (ref 4.0–10.5)
nRBC: 0 % (ref 0.0–0.2)

## 2022-11-30 LAB — URINALYSIS, ROUTINE W REFLEX MICROSCOPIC
Glucose, UA: NEGATIVE mg/dL
Hgb urine dipstick: NEGATIVE
Ketones, ur: NEGATIVE mg/dL
Nitrite: NEGATIVE
Protein, ur: NEGATIVE mg/dL
Specific Gravity, Urine: >=1.03 (ref 1.005–1.030)
pH: 5.5 (ref 5.0–8.0)

## 2022-11-30 LAB — COMPREHENSIVE METABOLIC PANEL
ALT: 16 U/L (ref 0–44)
AST: 25 U/L (ref 15–41)
Albumin: 4.2 g/dL (ref 3.5–5.0)
Alkaline Phosphatase: 60 U/L (ref 38–126)
Anion gap: 11 (ref 5–15)
BUN: 18 mg/dL (ref 8–23)
CO2: 25 mmol/L (ref 22–32)
Calcium: 9.3 mg/dL (ref 8.9–10.3)
Chloride: 99 mmol/L (ref 98–111)
Creatinine, Ser: 0.91 mg/dL (ref 0.44–1.00)
GFR, Estimated: >60 mL/min (ref >60)
Glucose, Bld: 87 mg/dL (ref 70–99)
Potassium: 3.1 mmol/L — ABNORMAL LOW (ref 3.5–5.1)
Sodium: 135 mmol/L (ref 135–145)
Total Bilirubin: 0.7 mg/dL (ref 0.3–1.2)
Total Protein: 7.9 g/dL (ref 6.5–8.1)

## 2022-11-30 LAB — LIPASE, BLOOD: Lipase: 33 U/L (ref 11–51)

## 2022-11-30 NOTE — ED Triage Notes (Signed)
C/o epigastric pain x 2 days;

## 2022-11-30 NOTE — ED Notes (Signed)
Back from CT

## 2022-11-30 NOTE — ED Notes (Signed)
Patient transported to CT 

## 2022-12-01 ENCOUNTER — Emergency Department (HOSPITAL_BASED_OUTPATIENT_CLINIC_OR_DEPARTMENT_OTHER): Payer: Medicare Other

## 2022-12-01 ENCOUNTER — Encounter (HOSPITAL_COMMUNITY): Payer: Self-pay | Admitting: Family Medicine

## 2022-12-01 DIAGNOSIS — R911 Solitary pulmonary nodule: Secondary | ICD-10-CM | POA: Diagnosis present

## 2022-12-01 DIAGNOSIS — J811 Chronic pulmonary edema: Secondary | ICD-10-CM | POA: Diagnosis not present

## 2022-12-01 DIAGNOSIS — I2609 Other pulmonary embolism with acute cor pulmonale: Secondary | ICD-10-CM | POA: Diagnosis not present

## 2022-12-01 DIAGNOSIS — R1084 Generalized abdominal pain: Secondary | ICD-10-CM | POA: Diagnosis not present

## 2022-12-01 DIAGNOSIS — R0789 Other chest pain: Secondary | ICD-10-CM | POA: Diagnosis present

## 2022-12-01 DIAGNOSIS — I2699 Other pulmonary embolism without acute cor pulmonale: Secondary | ICD-10-CM | POA: Diagnosis present

## 2022-12-01 DIAGNOSIS — I10 Essential (primary) hypertension: Secondary | ICD-10-CM | POA: Diagnosis not present

## 2022-12-01 DIAGNOSIS — F0390 Unspecified dementia without behavioral disturbance: Secondary | ICD-10-CM | POA: Diagnosis not present

## 2022-12-01 DIAGNOSIS — Z79899 Other long term (current) drug therapy: Secondary | ICD-10-CM | POA: Diagnosis not present

## 2022-12-01 DIAGNOSIS — Z86711 Personal history of pulmonary embolism: Secondary | ICD-10-CM | POA: Diagnosis present

## 2022-12-01 DIAGNOSIS — N390 Urinary tract infection, site not specified: Secondary | ICD-10-CM | POA: Diagnosis not present

## 2022-12-01 DIAGNOSIS — E876 Hypokalemia: Secondary | ICD-10-CM | POA: Diagnosis not present

## 2022-12-01 DIAGNOSIS — Z1152 Encounter for screening for COVID-19: Secondary | ICD-10-CM | POA: Diagnosis not present

## 2022-12-01 DIAGNOSIS — R079 Chest pain, unspecified: Secondary | ICD-10-CM | POA: Diagnosis not present

## 2022-12-01 DIAGNOSIS — D509 Iron deficiency anemia, unspecified: Secondary | ICD-10-CM | POA: Diagnosis not present

## 2022-12-01 LAB — BASIC METABOLIC PANEL
Anion gap: 11 (ref 5–15)
BUN: 13 mg/dL (ref 8–23)
CO2: 27 mmol/L (ref 22–32)
Calcium: 9.3 mg/dL (ref 8.9–10.3)
Chloride: 100 mmol/L (ref 98–111)
Creatinine, Ser: 0.72 mg/dL (ref 0.44–1.00)
GFR, Estimated: >60 mL/min (ref >60)
Glucose, Bld: 100 mg/dL — ABNORMAL HIGH (ref 70–99)
Potassium: 3.8 mmol/L (ref 3.5–5.1)
Sodium: 138 mmol/L (ref 135–145)

## 2022-12-01 LAB — RESP PANEL BY RT-PCR (RSV, FLU A&B, COVID)  RVPGX2
Influenza A by PCR: NEGATIVE
Influenza B by PCR: NEGATIVE
Resp Syncytial Virus by PCR: NEGATIVE
SARS Coronavirus 2 by RT PCR: NEGATIVE

## 2022-12-01 LAB — HEPARIN LEVEL (UNFRACTIONATED)
Heparin Unfractionated: 0.27 IU/mL — ABNORMAL LOW (ref 0.30–0.70)
Heparin Unfractionated: 0.34 IU/mL (ref 0.30–0.70)

## 2022-12-01 LAB — MAGNESIUM: Magnesium: 2.3 mg/dL (ref 1.7–2.4)

## 2022-12-01 MED ORDER — AMLODIPINE BESYLATE 10 MG PO TABS
10.0000 mg | ORAL_TABLET | Freq: Every day | ORAL | Status: DC
  Administered 2022-12-01 – 2022-12-02 (×2): 10 mg via ORAL
  Filled 2022-12-01 (×2): qty 1

## 2022-12-01 MED ORDER — HEPARIN (PORCINE) 25000 UT/250ML-% IV SOLN
750.0000 [IU]/h | INTRAVENOUS | Status: DC
  Administered 2022-12-01: 600 [IU]/h via INTRAVENOUS
  Filled 2022-12-01 (×2): qty 250

## 2022-12-01 MED ORDER — LATANOPROST 0.005 % OP SOLN
1.0000 [drp] | Freq: Every evening | OPHTHALMIC | Status: DC
  Administered 2022-12-01: 1 [drp] via OPHTHALMIC
  Filled 2022-12-01: qty 2.5

## 2022-12-01 MED ORDER — LIDOCAINE VISCOUS HCL 2 % MT SOLN
15.0000 mL | Freq: Once | OROMUCOSAL | Status: AC
  Administered 2022-12-01: 15 mL via ORAL
  Filled 2022-12-01: qty 15

## 2022-12-01 MED ORDER — HYDROCHLOROTHIAZIDE 12.5 MG PO TABS
12.5000 mg | ORAL_TABLET | Freq: Every morning | ORAL | Status: DC
  Administered 2022-12-02: 12.5 mg via ORAL
  Filled 2022-12-01: qty 1

## 2022-12-01 MED ORDER — PANTOPRAZOLE SODIUM 40 MG PO TBEC
40.0000 mg | DELAYED_RELEASE_TABLET | Freq: Two times a day (BID) | ORAL | Status: DC
  Administered 2022-12-01 – 2022-12-02 (×2): 40 mg via ORAL
  Filled 2022-12-01 (×2): qty 1

## 2022-12-01 MED ORDER — POTASSIUM CHLORIDE CRYS ER 20 MEQ PO TBCR
20.0000 meq | EXTENDED_RELEASE_TABLET | Freq: Once | ORAL | Status: AC
  Administered 2022-12-01: 20 meq via ORAL
  Filled 2022-12-01: qty 1

## 2022-12-01 MED ORDER — ALUM & MAG HYDROXIDE-SIMETH 200-200-20 MG/5ML PO SUSP
30.0000 mL | Freq: Once | ORAL | Status: AC
  Administered 2022-12-01: 30 mL via ORAL
  Filled 2022-12-01: qty 30

## 2022-12-01 MED ORDER — SODIUM CHLORIDE 0.9 % IV SOLN
1.0000 g | Freq: Once | INTRAVENOUS | Status: DC
  Administered 2022-12-01: 1 g via INTRAVENOUS
  Filled 2022-12-01: qty 10

## 2022-12-01 MED ORDER — HEPARIN BOLUS VIA INFUSION
2000.0000 [IU] | Freq: Once | INTRAVENOUS | Status: AC
  Administered 2022-12-01: 2000 [IU] via INTRAVENOUS

## 2022-12-01 MED ORDER — MUSCLE RUB 10-15 % EX CREA
TOPICAL_CREAM | CUTANEOUS | Status: DC | PRN
  Filled 2022-12-01: qty 85

## 2022-12-01 MED ORDER — IOHEXOL 350 MG/ML SOLN
75.0000 mL | Freq: Once | INTRAVENOUS | Status: AC | PRN
  Administered 2022-12-01: 75 mL via INTRAVENOUS

## 2022-12-01 MED ORDER — ACETAMINOPHEN 650 MG RE SUPP: 650.0000 mg | Freq: Four times a day (QID) | RECTAL | Status: DC | PRN

## 2022-12-01 MED ORDER — SODIUM CHLORIDE 0.9 % IV SOLN
500.0000 mg | Freq: Once | INTRAVENOUS | Status: DC
  Filled 2022-12-01: qty 5

## 2022-12-01 MED ORDER — ACETAMINOPHEN 325 MG PO TABS
650.0000 mg | ORAL_TABLET | Freq: Four times a day (QID) | ORAL | Status: DC | PRN
  Administered 2022-12-01: 650 mg via ORAL
  Filled 2022-12-01: qty 2

## 2022-12-01 MED ORDER — PROCHLORPERAZINE EDISYLATE 10 MG/2ML IJ SOLN: 5.0000 mg | INTRAMUSCULAR | Status: DC | PRN

## 2022-12-01 MED ORDER — METHOCARBAMOL 500 MG PO TABS
500.0000 mg | ORAL_TABLET | Freq: Once | ORAL | Status: AC
  Administered 2022-12-01: 500 mg via ORAL
  Filled 2022-12-01: qty 1

## 2022-12-01 MED ORDER — SODIUM CHLORIDE 0.9 % IV SOLN
1.0000 g | Freq: Once | INTRAVENOUS | Status: AC
  Administered 2022-12-01: 1 g via INTRAVENOUS

## 2022-12-01 MED ORDER — POTASSIUM CHLORIDE CRYS ER 20 MEQ PO TBCR
40.0000 meq | EXTENDED_RELEASE_TABLET | Freq: Once | ORAL | Status: AC
  Administered 2022-12-01: 40 meq via ORAL
  Filled 2022-12-01: qty 2

## 2022-12-01 NOTE — ED Notes (Signed)
Heparin level obtained and sent to lab for courier pickup

## 2022-12-01 NOTE — Progress Notes (Signed)
ANTICOAGULATION CONSULT NOTE   Pharmacy Consult for Heparin  Indication: pulmonary embolus  No Known Allergies  Patient Measurements: Height: '5\' 1"'$  (154.9 cm) Weight: 49.9 kg (110 lb) IBW/kg (Calculated) : 47.8  Vital Signs: Temp: 97.8 F (36.6 C) (01/12 2034) Temp Source: Oral (01/12 2034) BP: 127/76 (01/12 2034) Pulse Rate: 55 (01/12 2034)  Labs: Recent Labs    11/30/22 1931 12/01/22 1117 12/01/22 1815 12/01/22 2144  HGB 13.2  --   --   --   HCT 40.4  --   --   --   PLT 311  --   --   --   HEPARINUNFRC  --  0.27*  --  0.34  CREATININE 0.91  --  0.72  --      Estimated Creatinine Clearance: 37.4 mL/min (by C-G formula based on SCr of 0.72 mg/dL).  Assessment: 89 y/o F presents to the ED with 2 days of epigastric pain. Found to be hypoxic. CT angio with new onset small PE and now starting heparin. Initial heparin level is slightly below goal at 0.27. No bleeding noted.   HL 0.34 therapeutic on 700 units/hr Per RN no bleeding  Goal of Therapy:  Heparin level 0.3-0.7 units/ml Monitor platelets by anticoagulation protocol: Yes   Plan:  Continue heparin gtt at 700 units/hr Confirmatory heparin level in 8 hours Daily heparin level and CBC  Dolly Rias RPh 12/01/2022, 10:12 PM

## 2022-12-01 NOTE — Progress Notes (Signed)
ANTICOAGULATION CONSULT NOTE - Initial Consult  Pharmacy Consult for Heparin  Indication: pulmonary embolus  No Known Allergies  Patient Measurements: Height: '5\' 1"'$  (154.9 cm) Weight: 49.9 kg (110 lb) IBW/kg (Calculated) : 47.8  Vital Signs: Temp: 97.9 F (36.6 C) (01/12 0249) Temp Source: Oral (01/12 0249) BP: 145/79 (01/12 0200) Pulse Rate: 56 (01/12 0200)  Labs: Recent Labs    11/30/22 1931  HGB 13.2  HCT 40.4  PLT 311  CREATININE 0.91    Estimated Creatinine Clearance: 32.9 mL/min (by C-G formula based on SCr of 0.91 mg/dL).   Medical History: Past Medical History:  Diagnosis Date   Depression    Hyperlipidemia    Hypertension    Memory disorder    Osteoporosis    PVC (premature ventricular contraction)     Assessment: 89 y/o F presents to the ED with 2 days of epigastric pain. Found to be hypoxic. CT angio with new onset small PE. Starting heparin. Labs above reviewed. PTA meds reviewed.   Goal of Therapy:  Heparin level 0.3-0.7 units/ml Monitor platelets by anticoagulation protocol: Yes   Plan:  Heparin 2000 units BOLUS Start heparin drip at 600 units/hr 1100 Heparin level Daily CBC/Heparin level Monitor for bleeding  Narda Bonds, PharmD, BCPS Clinical Pharmacist Phone: [phone]

## 2022-12-01 NOTE — Progress Notes (Signed)
Plan of Care Note for accepted transfer   Patient: Amanda [name] MRN: [id number]   [place]: 11/30/2022  Facility requesting transfer: [facility]  Requesting Provider: Dr. Randal Buba   Reason for transfer: PE with hypoxia   Facility course: 89 yr old lady with HTN, COPD, and dementia presenting with epigastric pain and found to have potassium 3.1, QTc 555 ms, and tiny subsegmental PE without heart strain. Oxygen sat dropping to upper 80s on rm air and she was placed on 2 Lpm supplemental O2. IV heparin was started and she was also given oral potassium and IV Rocephin.   Plan of care: The patient is accepted for admission to Telemetry unit, at [facility].   Author: Vianne Bulls, MD 12/01/2022  Check [email or web address] for on-call coverage.  Nursing staff, Please call [place] number on Amion as soon as patient's arrival, so appropriate admitting provider can evaluate the pt.

## 2022-12-01 NOTE — Plan of Care (Signed)
  Problem: Education: ?Goal: Knowledge of General Education information will improve ?Description: Including pain rating scale, medication(s)/side effects and non-pharmacologic comfort measures ?Outcome: Progressing ?  ?Problem: Health Behavior/Discharge Planning: ?Goal: Ability to manage health-related needs will improve ?Outcome: Progressing ?  ?Problem: Clinical Measurements: ?Goal: Ability to maintain clinical measurements within normal limits will improve ?Outcome: Progressing ?  ?Problem: Activity: ?Goal: Risk for activity intolerance will decrease ?Outcome: Progressing ?  ?Problem: Nutrition: ?Goal: Adequate nutrition will be maintained ?Outcome: Progressing ?  ?Problem: Coping: ?Goal: Level of anxiety will decrease ?Outcome: Progressing ?  ?Problem: Elimination: ?Goal: Will not experience complications related to bowel motility ?Outcome: Progressing ?  ?Problem: Pain Managment: ?Goal: General experience of comfort will improve ?Outcome: Progressing ?  ?Problem: Safety: ?Goal: Ability to remain free from injury will improve ?Outcome: Progressing ?  ?Problem: Skin Integrity: ?Goal: Risk for impaired skin integrity will decrease ?Outcome: Progressing ?  ?

## 2022-12-01 NOTE — ED Notes (Signed)
Sats were dropping to 87% on RA, pt places on 2L of oxygen via [place]

## 2022-12-01 NOTE — ED Provider Notes (Signed)
[facility] Provider Note   CSN: [id number] Arrival date & time: 11/30/22  1902     History  Chief Complaint  Patient presents with   Abdominal Pain    Amanda [name] is a 89 y.o. female.  The history is provided by the patient and a relative. The history is limited by the condition of the patient (level 5 caveat dementia).  Abdominal Pain Pain location:  Generalized Pain radiation: into chest. Pain severity:  Moderate Onset quality:  Gradual Timing:  Constant Progression:  Unchanged Chronicity:  New Context: not sick contacts and not trauma   Relieved by:  Nothing Worsened by:  Nothing Ineffective treatments:  None tried Associated symptoms: no fever and no vomiting   Risk factors: no alcohol abuse   Patient presents with abdominal pain and chest pain x 1 day.  No fevers, no vomiting or diarrhea.  No medications taken    Past Medical History:  Diagnosis Date   Depression    Hyperlipidemia    Hypertension    Memory disorder    Osteoporosis    PVC (premature ventricular contraction)         Home Medications Prior to Admission medications   Medication Sig Start Date End Date Taking? Authorizing Provider  albuterol (PROVENTIL) (2.5 MG/3ML) 0.083% nebulizer solution Take 3 mLs (2.5 mg total) by nebulization every 6 (six) hours as needed for wheezing or shortness of breath. 10/11/21   Spero Geralds, MD  amLODipine (NORVASC) 10 MG tablet Take 10 mg by mouth daily.    [provider]  ASPIRIN PO Take 81 mg by mouth daily.    [provider]  citalopram (CELEXA) 20 MG tablet Take 20 mg by mouth daily.    [provider]  donepezil (ARICEPT) 10 MG tablet Take 10 mg by mouth at bedtime. 09/07/20   [provider]  hydrochlorothiazide (HYDRODIURIL) 25 MG tablet Take 25 mg by mouth every morning. 07/19/21   [provider]  latanoprost (XALATAN) 0.005 % ophthalmic solution 1 drop into each eye in  the evening    [provider]  Multiple Vitamins-Minerals (CENTRUM SILVER PO) Take 1 tablet by mouth daily.    [provider]  pantoprazole (PROTONIX) 40 MG tablet Take 40 mg by mouth 2 (two) times daily.    [provider]      Allergies    Patient has no known allergies.    Review of Systems   Review of Systems  Constitutional:  Negative for fever.  HENT:  Negative for facial swelling.   Eyes:  Negative for redness.  Respiratory:  Negative for wheezing and stridor.   Gastrointestinal:  Positive for abdominal pain. Negative for vomiting.  All other systems reviewed and are negative.   Physical Exam Updated Vital Signs BP (!) 145/79   Pulse (!) 56   Temp 97.7 F (36.5 C) (Oral)   Resp 15   Ht '5\' 1"'$  (1.549 m)   Wt 49.9 kg   SpO2 96%   BMI 20.78 kg/m  Physical Exam Vitals and nursing note reviewed.  Constitutional:      General: She is not in acute distress.    Appearance: She is well-developed.  HENT:     Head: Normocephalic and atraumatic.     Nose: Nose normal.  Eyes:     Pupils: Pupils are equal, round, and reactive to light.  Cardiovascular:     Rate and Rhythm: Normal rate and regular rhythm.  Pulses: Normal pulses.     Heart sounds: Normal heart sounds.  Pulmonary:     Effort: Pulmonary effort is normal. No respiratory distress.     Breath sounds: Normal breath sounds.  Abdominal:     General: Bowel sounds are normal. There is no distension.     Palpations: Abdomen is soft. There is no mass.     Tenderness: There is no abdominal tenderness. There is no guarding or rebound.     Hernia: No hernia is present.  Genitourinary:    Vagina: No vaginal discharge.  Musculoskeletal:        General: Normal range of motion.     Cervical back: Neck supple.  Skin:    General: Skin is warm and dry.     Capillary Refill: Capillary refill takes less than 2 seconds.     Findings: No erythema or rash.  Neurological:     General: No focal  deficit present.     Deep Tendon Reflexes: Reflexes normal.  Psychiatric:        Mood and Affect: Mood normal.     ED Results / Procedures / Treatments   Labs (all labs ordered are listed, but only abnormal results are displayed) Results for orders placed or performed during the hospital encounter of 11/30/22  Lipase, blood  Result Value Ref Range   Lipase 33 11 - 51 U/L  Comprehensive metabolic panel  Result Value Ref Range   Sodium 135 135 - 145 mmol/L   Potassium 3.1 (L) 3.5 - 5.1 mmol/L   Chloride 99 98 - 111 mmol/L   CO2 25 22 - 32 mmol/L   Glucose, Bld 87 70 - 99 mg/dL   BUN 18 8 - 23 mg/dL   Creatinine, Ser 0.91 0.44 - 1.00 mg/dL   Calcium 9.3 8.9 - 10.3 mg/dL   Total Protein 7.9 6.5 - 8.1 g/dL   Albumin 4.2 3.5 - 5.0 g/dL   AST 25 15 - 41 U/L   ALT 16 0 - 44 U/L   Alkaline Phosphatase 60 38 - 126 U/L   Total Bilirubin 0.7 0.3 - 1.2 mg/dL   GFR, Estimated >60 >60 mL/min   Anion gap 11 5 - 15  CBC  Result Value Ref Range   WBC 4.7 4.0 - 10.5 K/uL   RBC 4.43 3.87 - 5.11 MIL/uL   Hemoglobin 13.2 12.0 - 15.0 g/dL   HCT 40.4 36.0 - 46.0 %   MCV 91.2 80.0 - 100.0 fL   MCH 29.8 26.0 - 34.0 pg   MCHC 32.7 30.0 - 36.0 g/dL   RDW 14.1 11.5 - 15.5 %   Platelets 311 150 - 400 K/uL   nRBC 0.0 0.0 - 0.2 %  Urinalysis, Routine w reflex microscopic Urine, Clean Catch  Result Value Ref Range   Color, Urine YELLOW YELLOW   APPearance HAZY (A) CLEAR   Specific Gravity, Urine >=1.030 1.005 - 1.030   pH 5.5 5.0 - 8.0   Glucose, UA NEGATIVE NEGATIVE mg/dL   Hgb urine dipstick NEGATIVE NEGATIVE   Bilirubin Urine SMALL (A) NEGATIVE   Ketones, ur NEGATIVE NEGATIVE mg/dL   Protein, ur NEGATIVE NEGATIVE mg/dL   Nitrite NEGATIVE NEGATIVE   Leukocytes,Ua SMALL (A) NEGATIVE  Urinalysis, Microscopic (reflex)  Result Value Ref Range   RBC / HPF 0-5 0 - 5 RBC/hpf   WBC, UA 6-10 0 - 5 WBC/hpf   Bacteria, UA RARE (A) NONE SEEN   Squamous Epithelial / HPF 0-5 0 -  5 /HPF   Mucus  PRESENT    Hyaline Casts, UA PRESENT    CT Angio Chest PE W and/or Wo Contrast  Result Date: 12/01/2022 CLINICAL DATA:  Chronic dyspnea, epigastric abdominal pain EXAM: CT ANGIOGRAPHY CHEST WITH CONTRAST TECHNIQUE: Multidetector CT imaging of the chest was performed using the standard protocol during bolus administration of intravenous contrast. Multiplanar CT image reconstructions and MIPs were obtained to evaluate the vascular anatomy. RADIATION DOSE REDUCTION: This exam was performed according to the departmental dose-optimization program which includes automated exposure control, adjustment of the mA and/or kV according to patient size and/or use of iterative reconstruction technique. CONTRAST:  27m OMNIPAQUE IOHEXOL 350 MG/ML SOLN COMPARISON:  10/13/2020 FINDINGS: Cardiovascular: There is adequate opacification of the pulmonary arterial tree. There is a single central intraluminal filling defect within a subsegmental branch of the right lower lobe at axial image # 67/5 in keeping with acute pulmonary embolism. No additional intraluminal filling defects identified. Central pulmonary arteries are dilated in keeping with changes of pulmonary arterial hypertension. No significant coronary artery calcification. Global cardiac size within normal limits. No pericardial effusion. Mild atherosclerotic calcification within the thoracic aorta. The proximal descending thoracic aorta is dilated measuring 3.5 cm in diameter just beyond the arch. Ascending aorta is of normal caliber. Distal descending aorta is of normal caliber. Mediastinum/Nodes: Visualized thyroid is unremarkable. No pathologic adenopathy within the thorax. Esophagus unremarkable. Lungs/Pleura: Mild emphysema. Mild bibasilar atelectasis. 3 mm nodule within the right apex, axial image # 12/6, indeterminate. No pneumothorax or pleural effusion. No focal pulmonary infiltrate. Central airways are widely patent. Upper Abdomen: No acute abnormality.  Musculoskeletal: Remote appearing superior endplate fracture of T8 with mild loss of height. No acute bone abnormality. Degenerative changes are seen within the shoulders bilaterally. No lytic or blastic bone lesion. Review of the MIP images confirms the above findings. IMPRESSION: 1. Tiny, subsegmental acute pulmonary embolism. No CT evidence of right heart strain. 2. Morphologic changes in keeping with pulmonary arterial hypertension. 3. Mild emphysema. 4. 3 mm right solid pulmonary nodule within the upper lobe. Per Fleischner Society Guidelines, a non-contrast Chest CT at 12 months is optional. If performed and the nodule is stable at 12 months, no further follow-up is recommended. These guidelines do not apply to immunocompromised patients and patients with cancer. Follow up in patients with significant comorbidities as clinically warranted. For lung cancer screening, adhere to Lung-RADS guidelines. Reference: Radiology. 2017; 284(1):228-43. 5. Aortic atherosclerosis. Aortic Atherosclerosis (ICD10-I70.0) and Emphysema (ICD10-J43.9). Electronically Signed   By: AFidela SalisburyM.D.   On: 12/01/2022 02:05   DG Chest Portable 1 View  Result Date: 12/01/2022 CLINICAL DATA:  Epigastric chest pain for 2 days EXAM: PORTABLE CHEST 1 VIEW COMPARISON:  07/29/2021 FINDINGS: Stable cardiomegaly. Aortic atherosclerotic calcification. No focal consolidation, pleural effusion, or pneumothorax. Chronic bronchovascular crowding in the lower lungs. Pulmonary vascular congestion. No acute osseous abnormality. IMPRESSION: No change from 07/29/2021. Cardiomegaly and pulmonary vascular congestion. Electronically Signed   By: TPlacido SouM.D.   On: 12/01/2022 00:29   CT Renal Stone Study  Result Date: 11/30/2022 CLINICAL DATA:  Abdominal/flank pain. Epigastric pain. Stones suspected EXAM: CT ABDOMEN AND PELVIS WITHOUT CONTRAST TECHNIQUE: Multidetector CT imaging of the abdomen and pelvis was performed following the standard  protocol without IV contrast. RADIATION DOSE REDUCTION: This exam was performed according to the departmental dose-optimization program which includes automated exposure control, adjustment of the mA and/or kV according to patient size and/or use of iterative reconstruction technique. COMPARISON:  07/29/2021 FINDINGS: Lower chest: Patchy airspace opacities in the lower lungs suspicious for bronchopneumonia. Hepatobiliary: Calcified hepatic granulomas gallbladder is unremarkable. No biliary dilation. Pancreas: Unremarkable. Spleen: Unremarkable. Adrenals/Urinary Tract: Right adrenal gland is normal. 2.5 cm left adrenal adenoma. No follow-up recommended. No urinary calculi or hydronephrosis. Unremarkable bladder. Stomach/Bowel: Colonic diverticulosis without diverticulitis. Normal caliber large and small bowel. Normal appendix. Vascular/Lymphatic: Extensive calcified atherosclerosis of the aorta and its branches. No lymphadenopathy. Reproductive: Hysterectomy. Other: Trace fluid in the pelvis.  No free intraperitoneal air. Musculoskeletal: No fracture is seen. IMPRESSION: No acute abnormality in the abdomen or pelvis. Patchy opacities in the lower lung suspicious for bronchopneumonia. Colonic diverticulosis without diverticulitis. Extensive calcified atherosclerosis of the aorta and its branches. Electronically Signed   By: Placido Sou M.D.   On: 11/30/2022 23:54    EKG 73 with long QT 555    Radiology CT Angio Chest PE W and/or Wo Contrast  Result Date: 12/01/2022 CLINICAL DATA:  Chronic dyspnea, epigastric abdominal pain EXAM: CT ANGIOGRAPHY CHEST WITH CONTRAST TECHNIQUE: Multidetector CT imaging of the chest was performed using the standard protocol during bolus administration of intravenous contrast. Multiplanar CT image reconstructions and MIPs were obtained to evaluate the vascular anatomy. RADIATION DOSE REDUCTION: This exam was performed according to the departmental dose-optimization program  which includes automated exposure control, adjustment of the mA and/or kV according to patient size and/or use of iterative reconstruction technique. CONTRAST:  43m OMNIPAQUE IOHEXOL 350 MG/ML SOLN COMPARISON:  10/13/2020 FINDINGS: Cardiovascular: There is adequate opacification of the pulmonary arterial tree. There is a single central intraluminal filling defect within a subsegmental branch of the right lower lobe at axial image # 67/5 in keeping with acute pulmonary embolism. No additional intraluminal filling defects identified. Central pulmonary arteries are dilated in keeping with changes of pulmonary arterial hypertension. No significant coronary artery calcification. Global cardiac size within normal limits. No pericardial effusion. Mild atherosclerotic calcification within the thoracic aorta. The proximal descending thoracic aorta is dilated measuring 3.5 cm in diameter just beyond the arch. Ascending aorta is of normal caliber. Distal descending aorta is of normal caliber. Mediastinum/Nodes: Visualized thyroid is unremarkable. No pathologic adenopathy within the thorax. Esophagus unremarkable. Lungs/Pleura: Mild emphysema. Mild bibasilar atelectasis. 3 mm nodule within the right apex, axial image # 12/6, indeterminate. No pneumothorax or pleural effusion. No focal pulmonary infiltrate. Central airways are widely patent. Upper Abdomen: No acute abnormality. Musculoskeletal: Remote appearing superior endplate fracture of T8 with mild loss of height. No acute bone abnormality. Degenerative changes are seen within the shoulders bilaterally. No lytic or blastic bone lesion. Review of the MIP images confirms the above findings. IMPRESSION: 1. Tiny, subsegmental acute pulmonary embolism. No CT evidence of right heart strain. 2. Morphologic changes in keeping with pulmonary arterial hypertension. 3. Mild emphysema. 4. 3 mm right solid pulmonary nodule within the upper lobe. Per Fleischner Society Guidelines, a  non-contrast Chest CT at 12 months is optional. If performed and the nodule is stable at 12 months, no further follow-up is recommended. These guidelines do not apply to immunocompromised patients and patients with cancer. Follow up in patients with significant comorbidities as clinically warranted. For lung cancer screening, adhere to Lung-RADS guidelines. Reference: Radiology. 2017; 284(1):228-43. 5. Aortic atherosclerosis. Aortic Atherosclerosis (ICD10-I70.0) and Emphysema (ICD10-J43.9). Electronically Signed   By: AFidela SalisburyM.D.   On: 12/01/2022 02:05   DG Chest Portable 1 View  Result Date: 12/01/2022 CLINICAL DATA:  Epigastric chest pain for 2 days EXAM: PORTABLE CHEST 1  VIEW COMPARISON:  07/29/2021 FINDINGS: Stable cardiomegaly. Aortic atherosclerotic calcification. No focal consolidation, pleural effusion, or pneumothorax. Chronic bronchovascular crowding in the lower lungs. Pulmonary vascular congestion. No acute osseous abnormality. IMPRESSION: No change from 07/29/2021. Cardiomegaly and pulmonary vascular congestion. Electronically Signed   By: Placido Sou M.D.   On: 12/01/2022 00:29   CT Renal Stone Study  Result Date: 11/30/2022 CLINICAL DATA:  Abdominal/flank pain. Epigastric pain. Stones suspected EXAM: CT ABDOMEN AND PELVIS WITHOUT CONTRAST TECHNIQUE: Multidetector CT imaging of the abdomen and pelvis was performed following the standard protocol without IV contrast. RADIATION DOSE REDUCTION: This exam was performed according to the departmental dose-optimization program which includes automated exposure control, adjustment of the mA and/or kV according to patient size and/or use of iterative reconstruction technique. COMPARISON:  07/29/2021 FINDINGS: Lower chest: Patchy airspace opacities in the lower lungs suspicious for bronchopneumonia. Hepatobiliary: Calcified hepatic granulomas gallbladder is unremarkable. No biliary dilation. Pancreas: Unremarkable. Spleen: Unremarkable.  Adrenals/Urinary Tract: Right adrenal gland is normal. 2.5 cm left adrenal adenoma. No follow-up recommended. No urinary calculi or hydronephrosis. Unremarkable bladder. Stomach/Bowel: Colonic diverticulosis without diverticulitis. Normal caliber large and small bowel. Normal appendix. Vascular/Lymphatic: Extensive calcified atherosclerosis of the aorta and its branches. No lymphadenopathy. Reproductive: Hysterectomy. Other: Trace fluid in the pelvis.  No free intraperitoneal air. Musculoskeletal: No fracture is seen. IMPRESSION: No acute abnormality in the abdomen or pelvis. Patchy opacities in the lower lung suspicious for bronchopneumonia. Colonic diverticulosis without diverticulitis. Extensive calcified atherosclerosis of the aorta and its branches. Electronically Signed   By: Placido Sou M.D.   On: 11/30/2022 23:54    Procedures Procedures    Medications Ordered in ED Medications  heparin ADULT infusion 100 units/mL (25000 units/272m) (has no administration in time range)  cefTRIAXone (ROCEPHIN) 1 g in sodium chloride 0.9 % 100 mL IVPB (1 g Intravenous New Bag/Given 12/01/22 0214)  heparin bolus via infusion 2,000 Units (has no administration in time range)  alum & mag hydroxide-simeth (MAALOX/MYLANTA) 200-200-20 MG/5ML suspension 30 mL (30 mLs Oral Given 12/01/22 0006)    And  lidocaine (XYLOCAINE) 2 % viscous mouth solution 15 mL (15 mLs Oral Given 12/01/22 0006)  iohexol (OMNIPAQUE) 350 MG/ML injection 75 mL (75 mLs Intravenous Contrast Given 12/01/22 0127)    ED Course/ Medical Decision Making/ A&P                           Medical Decision Making Patient with chest and abdominal pain   Amount and/or Complexity of Data Reviewed Independent Historian:     Details: Son see above  External Data Reviewed: notes.    Details: Previous notes reviewed  Labs: ordered.    Details: Urine is consistent with mild UTI.  Lipase normal 33, normal white count 4.7, normal hemoglobin and  platelet count 311, normal sodium 135, low potassium 3.1, normal creatinine  Radiology: ordered and independent interpretation performed.    Details: No stones on CT by me No PNA on CTA by me  ECG/medicine tests: ordered and independent interpretation performed. Decision-making details documented in ED Course.    Details: Long QT 555  Risk OTC drugs. Prescription drug management. Decision regarding hospitalization.  Critical Care Total time providing critical care: 60 minutes (Heparin drip and antibiotics and reassessment )   CRITICAL CARE Performed by: Kaylen Nghiem K Jameer Storie-Rasch Total critical care time: 60 minutes Critical care time was exclusive of separately billable procedures and treating other patients. Critical care was necessary to  treat or prevent imminent or life-threatening deterioration. Critical care was time spent personally by me on the following activities: development of treatment plan with patient and/or surrogate as well as nursing, discussions with consultants, evaluation of patient's response to treatment, examination of patient, obtaining history from patient or surrogate, ordering and performing treatments and interventions, ordering and review of laboratory studies, ordering and review of radiographic studies, pulse oximetry and re-evaluation of patient's condition.\ Final Clinical Impression(s) / ED Diagnoses Final diagnoses:  Other acute pulmonary embolism without acute cor pulmonale (HCC)  Urinary tract infection without hematuria, site unspecified   The patient appears reasonably stabilized for admission considering the current resources, flow, and capabilities available in the ED at this time, and I doubt any other [facility] requiring further screening and/or treatment in the ED prior to admission.  Rx / DC Orders ED Discharge Orders     None         Lu Paradise, MD 12/01/22 1017

## 2022-12-01 NOTE — H&P (Signed)
History and Physical    Patient: Amanda [name] JSE:[id number] DOB: 15-Nov-1933 DOA: 11/30/2022 DOS: the patient was seen and examined on 12/01/2022 PCP: Caren Macadam, MD  Patient coming from: Home  Chief Complaint:  Chief Complaint  Patient presents with   Abdominal Pain   HPI: Amanda [name] is a 89 y.o. female with medical history significant of depression, hyperlipidemia, hypertension, osteoporosis, PVCs, history of DVT treated with Vantin according to the patient's niece who provided most of the history.  According to her niece, she was was complaining of chest pain and abdominal pain for the past 2 days home as associated with dyspnea.  No fever, chills or night sweats. No sore throat, rhinorrhea, wheezing or hemoptysis.  No palpitations, diaphoresis, PND, orthopnea or pitting edema of the lower extremities.  No appetite changes, abdominal pain, diarrhea, constipation, melena or hematochezia.  No flank pain, dysuria, frequency or hematuria.  No polyuria, polydipsia, polyphagia or blurred vision.  She has been having leg cramps.  Around [place], she went to visit family that was 2 hours away by car.  She is usually very active and does not stay sitting for long periods according to family members.  ED course: Initial vital signs were temperature 98.2 F, pulse 76, respiration 18, BP 115/65 mmHg O2 sat 90% on room air.  The patient received Maalox plus viscous lidocaine KCl 40 mEq p.o. x 1 and then was started on a heparin infusion.  Lab work: Urinalysis was hazy with small bilirubinuria and small leukocyte esterase with rare bacteria microscopic examination.  CBC was normal.  CMP showed a potassium of 3.1 mmol/L, but was otherwise unremarkable.  Lipase was normal.  Coronavirus, RSV and influenza PCR negative.  Imaging: Portable 1 view chest radiograph with cardiomegaly and chronic bronchovascular crowding in the lower lungs.  Pulmonary vascular congestion.  CTA chest with tiny, subsegmental  acute pulmonary embolism.  No CT evidence of right heart strain.  Morphologic changes of pulmonary arterial hypertension.  Mild emphysema.  3 mm right solid pulmonary nodule within the upper lobe with noncontrast chest CT at 12 months being optional.   Review of Systems: As mentioned in the history of present illness. All other systems reviewed and are negative.  Past Medical History:  Diagnosis Date   Depression    Hyperlipidemia    Hypertension    Memory disorder    Osteoporosis    PVC (premature ventricular contraction)    Past Surgical History:  Procedure Laterality Date   PARTIAL HYSTERECTOMY     Social History:  reports that she quit smoking about 51 years ago. Her smoking use included cigarettes. She started smoking about 68 years ago. She has never used smokeless tobacco. She reports that she does not drink alcohol and does not use drugs.  No Known Allergies  Family History  Problem Relation Age of Onset   Lung cancer Neg Hx    Memory loss Neg Hx     Prior to Admission medications   Medication Sig Start Date End Date Taking? Authorizing Provider  albuterol (PROVENTIL) (2.5 MG/3ML) 0.083% nebulizer solution Take 3 mLs (2.5 mg total) by nebulization every 6 (six) hours as needed for wheezing or shortness of breath. 10/11/21  Yes Spero Geralds, MD  amLODipine (NORVASC) 10 MG tablet Take 10 mg by mouth daily.   Yes [provider]  ASPIRIN PO Take 81 mg by mouth daily.   Yes [provider]  citalopram (CELEXA) 20 MG tablet Take 20 mg by  mouth every morning.   Yes [provider]  cyanocobalamin (VITAMIN B12) 1000 MCG tablet Take 1,000 mcg by mouth every morning.   Yes [provider]  donepezil (ARICEPT) 10 MG tablet Take 10 mg by mouth at bedtime. 09/07/20  Yes [provider]  hydrochlorothiazide (HYDRODIURIL) 12.5 MG tablet Take 12.5 mg by mouth every morning. 11/14/22  Yes [provider]  latanoprost (XALATAN) 0.005  % ophthalmic solution Place 1 drop into both eyes every evening.   Yes [provider]  Multiple Vitamins-Minerals (CENTRUM SILVER PO) Take 1 tablet by mouth every morning.   Yes [provider]  pantoprazole (PROTONIX) 40 MG tablet Take 40 mg by mouth 2 (two) times daily.   Yes [provider]    Physical Exam: Vitals:   12/01/22 1330 12/01/22 1500 12/01/22 1534 12/01/22 1632  BP: (!) 159/80 (!) 143/75  (!) 164/79  Pulse: (!) 56 (!) 55  (!) 52  Resp: '18 18  16  '$ Temp: 98 F (36.7 C)  98.2 F (36.8 C) 98.4 F (36.9 C)  TempSrc:   Oral Oral  SpO2: 94% 90%  94%  Weight:      Height:       Physical Exam Vitals and nursing note reviewed.  Constitutional:      General: She is awake.     Appearance: She is normal weight. She is ill-appearing.     Comments: Chronically ill-appearing.  HENT:     Head: Normocephalic.     Nose: No rhinorrhea.     Mouth/Throat:     Mouth: Mucous membranes are moist.  Eyes:     General: No scleral icterus.    Pupils: Pupils are equal, round, and reactive to light.  Neck:     Vascular: No JVD.  Cardiovascular:     Rate and Rhythm: Normal rate and regular rhythm.     Heart sounds: S1 normal and S2 normal.  Pulmonary:     Effort: Pulmonary effort is normal.     Breath sounds: Normal breath sounds. No wheezing, rhonchi or rales.  Abdominal:     Palpations: Abdomen is soft.     Tenderness: There is no abdominal tenderness.  Musculoskeletal:     Cervical back: Neck supple.     Right lower leg: No edema.     Left lower leg: No edema.  Skin:    General: Skin is warm and dry.  Neurological:     General: No focal deficit present.     Mental Status: She is alert. Mental status is at baseline.  Psychiatric:        Mood and Affect: Mood normal.        Behavior: Behavior normal. Behavior is cooperative.   Data Reviewed:  There are no new results to review at this time.  Assessment and Plan: Principal Problem:   Acute  pulmonary embolism ([place]) Observation/telemetry. Supplemental oxygen as needed. Continue heparin infusion.. Obtain echocardiogram. Check lower extremity Doppler. Analgesics as needed.  Active Problems:   HTN (hypertension) Continue amlodipine 10 mg p.o. daily. Continue hydrochlorothiazide 12.5 mg p.o. daily.    Hyperlipidemia Currently not on medications.    Dementia ([place]) I will hold citalopram due to QT prolongation.    Iron deficiency anemia Monitor hematocrit and hemoglobin. Transfuse as needed.    Pulmonary nodule Optional follow-up in 12 months per radiology.     Advance Care Planning:   Code Status: Full Code   Consults:   Family Communication: I spoke  to her niece Milagros Loll on the phone and with her daughter Arnette Felts in person.  Severity of Illness: The appropriate patient status for this patient is OBSERVATION. Observation status is judged to be reasonable and necessary in order to provide the required intensity of service to ensure the patient's safety. The patient's presenting symptoms, physical exam findings, and initial radiographic and laboratory data in the context of their medical condition is felt to place them at decreased risk for further clinical deterioration. Furthermore, it is anticipated that the patient will be medically stable for discharge from the hospital within 2 midnights of admission.   Author: Reubin Milan, MD 12/01/2022 4:55 PM  For on call review [email or web address].   This document was prepared using Dragon voice recognition software and may contain some unintended transcription errors.

## 2022-12-01 NOTE — ED Notes (Signed)
Lab courier paged for collection of heparin level.  Awaiting arrival to draw specimen

## 2022-12-01 NOTE — Progress Notes (Signed)
ANTICOAGULATION CONSULT NOTE   Pharmacy Consult for Heparin  Indication: pulmonary embolus  No Known Allergies  Patient Measurements: Height: '5\' 1"'$  (154.9 cm) Weight: 49.9 kg (110 lb) IBW/kg (Calculated) : 47.8  Vital Signs: Temp: 98 F (36.7 C) (01/12 1330) Temp Source: Oral (01/12 0755) BP: 159/80 (01/12 1330) Pulse Rate: 56 (01/12 1330)  Labs: Recent Labs    11/30/22 1931 12/01/22 1117  HGB 13.2  --   HCT 40.4  --   PLT 311  --   HEPARINUNFRC  --  0.27*  CREATININE 0.91  --      Estimated Creatinine Clearance: 32.9 mL/min (by C-G formula based on SCr of 0.91 mg/dL).  Assessment: 89 y/o F presents to the ED with 2 days of epigastric pain. Found to be hypoxic. CT angio with new onset small PE and now starting heparin. Initial heparin level is slightly below goal at 0.27. No bleeding noted.   Goal of Therapy:  Heparin level 0.3-0.7 units/ml Monitor platelets by anticoagulation protocol: Yes   Plan:  Increase heparin gtt to 700 units/hr Check an 8 hr heparin level Daily heparin level and CBC  Salome Arnt, PharmD, BCPS, BCEMP Clinical Pharmacist Please see AMION for all pharmacy numbers 12/01/2022 1:49 PM

## 2022-12-02 ENCOUNTER — Observation Stay (HOSPITAL_BASED_OUTPATIENT_CLINIC_OR_DEPARTMENT_OTHER): Payer: Medicare Other

## 2022-12-02 DIAGNOSIS — I2699 Other pulmonary embolism without acute cor pulmonale: Secondary | ICD-10-CM

## 2022-12-02 DIAGNOSIS — I2602 Saddle embolus of pulmonary artery with acute cor pulmonale: Secondary | ICD-10-CM | POA: Diagnosis not present

## 2022-12-02 LAB — ECHOCARDIOGRAM COMPLETE
AR max vel: 3.05 cm2
AV Area VTI: 2.75 cm2
AV Area mean vel: 2.9 cm2
AV Mean grad: 4 mmHg
AV Peak grad: 7.4 mmHg
Ao pk vel: 1.36 m/s
Area-P 1/2: 2.22 cm2
Calc EF: 55.6 %
Height: 61 in
P 1/2 time: 856 msec
S' Lateral: 2.5 cm
Single Plane A2C EF: 56.3 %
Single Plane A4C EF: 54.9 %
Weight: 1760 oz

## 2022-12-02 LAB — COMPREHENSIVE METABOLIC PANEL
ALT: 15 U/L (ref 0–44)
AST: 20 U/L (ref 15–41)
Albumin: 3.5 g/dL (ref 3.5–5.0)
Alkaline Phosphatase: 50 U/L (ref 38–126)
Anion gap: 10 (ref 5–15)
BUN: 11 mg/dL (ref 8–23)
CO2: 25 mmol/L (ref 22–32)
Calcium: 9 mg/dL (ref 8.9–10.3)
Chloride: 102 mmol/L (ref 98–111)
Creatinine, Ser: 0.65 mg/dL (ref 0.44–1.00)
GFR, Estimated: >60 mL/min (ref >60)
Glucose, Bld: 99 mg/dL (ref 70–99)
Potassium: 3.9 mmol/L (ref 3.5–5.1)
Sodium: 137 mmol/L (ref 135–145)
Total Bilirubin: 0.3 mg/dL (ref 0.3–1.2)
Total Protein: 6.6 g/dL (ref 6.5–8.1)

## 2022-12-02 LAB — HEPARIN LEVEL (UNFRACTIONATED): Heparin Unfractionated: 0.27 IU/mL — ABNORMAL LOW (ref 0.30–0.70)

## 2022-12-02 LAB — CBC
HCT: 39.6 % (ref 36.0–46.0)
Hemoglobin: 12.9 g/dL (ref 12.0–15.0)
MCH: 30.3 pg (ref 26.0–34.0)
MCHC: 32.6 g/dL (ref 30.0–36.0)
MCV: 93 fL (ref 80.0–100.0)
Platelets: 250 10*3/uL (ref 150–400)
RBC: 4.26 MIL/uL (ref 3.87–5.11)
RDW: 14.1 % (ref 11.5–15.5)
WBC: 4.1 10*3/uL (ref 4.0–10.5)
nRBC: 0 % (ref 0.0–0.2)

## 2022-12-02 MED ORDER — HYDROCHLOROTHIAZIDE 12.5 MG PO TABS: 12.5000 mg | ORAL_TABLET | Freq: Every morning | ORAL | 0 refills | Status: DC

## 2022-12-02 MED ORDER — ASPIRIN 81 MG PO TBEC: 81.0000 mg | DELAYED_RELEASE_TABLET | Freq: Every day | ORAL | Status: DC

## 2022-12-02 MED ORDER — HYDROCHLOROTHIAZIDE 12.5 MG PO TABS: 12.5000 mg | ORAL_TABLET | Freq: Every morning | ORAL | Status: DC

## 2022-12-02 MED ORDER — RIVAROXABAN (XARELTO) VTE STARTER PACK (15 & 20 MG): ORAL_TABLET | ORAL | 0 refills | Status: DC

## 2022-12-02 MED ORDER — RIVAROXABAN 15 MG PO TABS
15.0000 mg | ORAL_TABLET | Freq: Two times a day (BID) | ORAL | Status: DC
  Administered 2022-12-02: 15 mg via ORAL
  Filled 2022-12-02: qty 1

## 2022-12-02 MED ORDER — METOPROLOL SUCCINATE ER 25 MG PO TB24: 25.0000 mg | ORAL_TABLET | Freq: Every day | ORAL | 0 refills | Status: DC

## 2022-12-02 MED ORDER — PANTOPRAZOLE SODIUM 40 MG PO TBEC: 40.0000 mg | DELAYED_RELEASE_TABLET | Freq: Two times a day (BID) | ORAL | 0 refills | Status: AC

## 2022-12-02 MED ORDER — RIVAROXABAN 20 MG PO TABS: 20.0000 mg | ORAL_TABLET | Freq: Every day | ORAL | Status: DC

## 2022-12-02 MED ORDER — ACETAMINOPHEN 325 MG PO TABS: 650.0000 mg | ORAL_TABLET | Freq: Four times a day (QID) | ORAL | Status: AC | PRN

## 2022-12-02 MED ORDER — METOPROLOL SUCCINATE ER 25 MG PO TB24: 25.0000 mg | ORAL_TABLET | Freq: Every day | ORAL | Status: DC

## 2022-12-02 MED ORDER — CITALOPRAM HYDROBROMIDE 20 MG PO TABS: 20.0000 mg | ORAL_TABLET | Freq: Every morning | ORAL | 0 refills | Status: AC

## 2022-12-02 NOTE — Progress Notes (Signed)
Discharge instructions reviewed with pt and her daughter.  Opportunity given for questions. They both verbalized understanding.  IV's removed and pt escorted to D/C area via wheelchair.

## 2022-12-02 NOTE — Progress Notes (Signed)
Contacted by staff at request of patient and family for Amanda [name].I relayed to the nurse and patient how difficult it would be to get this document notarized today, but it can be notarized at any location where a notary is. Met with patient and family provided the education and document was left with them.

## 2022-12-02 NOTE — Progress Notes (Signed)
ANTICOAGULATION CONSULT NOTE - Follow Up Consult  Pharmacy Consult for heparin Indication: acute pulmonary embolus  No Known Allergies  Patient Measurements: Height: '5\' 1"'$  (154.9 cm) Weight: 49.9 kg (110 lb) IBW/kg (Calculated) : 47.8 Heparin Dosing Weight: 50 kg  Vital Signs: Temp: 98.6 F (37 C) (01/13 0454) Temp Source: Oral (01/13 0454) BP: 129/79 (01/13 0454) Pulse Rate: 55 (01/13 0454)  Labs: Recent Labs    11/30/22 1931 12/01/22 1117 12/01/22 1815 12/01/22 2144 12/02/22 0734  HGB 13.2  --   --   --  12.9  HCT 40.4  --   --   --  39.6  PLT 311  --   --   --  250  HEPARINUNFRC  --  0.27*  --  0.34 0.27*  CREATININE 0.91  --  0.72  --   --     Estimated Creatinine Clearance: 37.4 mL/min (by C-G formula based on SCr of 0.72 mg/dL).   Assessment: Patient's an 89 y.o F who presented to the ED on 11/30/22 with c/o epigastric pain. Chest CT on 12/01/22 showed a small acute subsegmental acute pulmonary embolism. She's currently on heparin drip for VTE treatment.  Today, 12/02/2022: - heparin level is slightly subtherapeutic at 0.27 - cbc ok - no bleeding documented  Goal of Therapy:  Heparin level 0.3-0.7 units/ml Monitor platelets by anticoagulation protocol: Yes   Plan:  - increase heparin drip to 750 units/hr - check 8 hr heparin level  - monitor for s/sx bleeding   Dia Sitter P 12/02/2022,8:24 AM

## 2022-12-02 NOTE — Discharge Instructions (Addendum)
Information on my medicine - XARELTO (rivaroxaban)  WHY WAS XARELTO PRESCRIBED FOR YOU? Xarelto was prescribed to treat blood clots that may have been found in the veins of your legs (deep vein thrombosis) or in your lungs (pulmonary embolism) and to reduce the risk of them occurring again.  What do you need to know about Xarelto? The starting dose is one 15 mg tablet taken TWICE daily with food for the FIRST 21 DAYS then on 12/23/22  the dose is changed to one 20 mg tablet taken ONCE A DAY with your evening meal.  DO NOT stop taking Xarelto without talking to the health care provider who prescribed the medication.  Refill your prescription for 20 mg tablets before you run out.  After discharge, you should have regular check-up appointments with your healthcare provider that is prescribing your Xarelto.  In the future your dose may need to be changed if your kidney function changes by a significant amount.  What do you do if you miss a dose? If you are taking Xarelto TWICE DAILY and you miss a dose, take it as soon as you remember. You may take two 15 mg tablets (total 30 mg) at the same time then resume your regularly scheduled 15 mg twice daily the next day.  If you are taking Xarelto ONCE DAILY and you miss a dose, take it as soon as you remember on the same day then continue your regularly scheduled once daily regimen the next day. Do not take two doses of Xarelto at the same time.   Important Safety Information Xarelto is a blood thinner medicine that can cause bleeding. You should call your healthcare provider right away if you experience any of the following: Bleeding from an injury or your nose that does not stop. Unusual colored urine (red or dark brown) or unusual colored stools (red or black). Unusual bruising for unknown reasons. A serious fall or if you hit your head (even if there is no bleeding).  Some medicines may interact with Xarelto and might increase your risk of  bleeding while on Xarelto. To help avoid this, consult your healthcare provider or pharmacist prior to using any new prescription or non-prescription medications, including herbals, vitamins, non-steroidal anti-inflammatory drugs (NSAIDs) and supplements.  This website has more information on Xarelto: [email or web address]

## 2022-12-02 NOTE — Progress Notes (Signed)
2D echo attempted, but patient in chair. Will try later. 

## 2022-12-02 NOTE — Progress Notes (Signed)
BLE venous duplex has been completed.  Preliminary results given to Dr. Posey Pronto.   Results can be found under chart review under CV PROC. 12/02/2022 1:26 PM Jaxten Brosh RVT, RDMS

## 2022-12-02 NOTE — Plan of Care (Signed)
  Problem: Education: Goal: Knowledge of General Education information will improve Description Including pain rating scale, medication(s)/side effects and non-pharmacologic comfort measures Outcome: Progressing   Problem: Clinical Measurements: Goal: Ability to maintain clinical measurements within normal limits will improve Outcome: Progressing Goal: Will remain free from infection Outcome: Progressing Goal: Diagnostic test results will improve Outcome: Progressing Goal: Respiratory complications will improve Outcome: Progressing Goal: Cardiovascular complication will be avoided Outcome: Progressing   Problem: Activity: Goal: Risk for activity intolerance will decrease Outcome: Progressing   Problem: Nutrition: Goal: Adequate nutrition will be maintained Outcome: Progressing   Problem: Coping: Goal: Level of anxiety will decrease Outcome: Progressing   Problem: Elimination: Goal: Will not experience complications related to bowel motility Outcome: Progressing   Problem: Pain Managment: Goal: General experience of comfort will improve Outcome: Progressing   Problem: Safety: Goal: Ability to remain free from injury will improve Outcome: Progressing   Problem: Skin Integrity: Goal: Risk for impaired skin integrity will decrease Outcome: Progressing   

## 2022-12-02 NOTE — Discharge Summary (Signed)
Physician Discharge Summary   Patient: Amanda [name] MRN: [id number] DOB: 04/05/1933  Admit date:     11/30/2022  Discharge date: 12/02/2022  Discharge Physician: Berle Mull  PCP: Caren Macadam, MD  Recommendations at discharge: Follow-up with PCP in 1 week Follow-up with vascular surgery in 1 to 2 weeks.   Follow-up Information     Caren Macadam, MD. Schedule an appointment as soon as possible for a visit in 1 week(s).   Specialty: Family Medicine Contact information: [place] [address] [phone]         [facility]. Schedule an appointment as soon as possible for a visit in 2 week(s).   Contact information: [address] [place] [phone]               Discharge Diagnoses: Principal Problem:   Acute pulmonary embolism ([place]) Active Problems:   HTN (hypertension)   Hyperlipidemia   Dementia (HCC)   Iron deficiency anemia   Pulmonary nodule  Assessment and Plan  Acute pulmonary embolism. Patient presents with complaints of chest pain which is pleuritic in nature on the right, shortness of breath and dizziness. Workup in the ED shows a tiny subsegmental single lobe pulmonary embolism. Patient is not hypoxic not tachycardic not hypotensive. Patient was started on IV heparin. Lower EXTR with Doppler is negative for DVT. Echocardiogram is negative for any RV strain. At present I do not think the patient requires any further IV heparin and can be transition to oral anticoagulation. Patient has had history of PE DVT in the past and was on Xarelto and tolerated it well and family currently agreeable to transition to [facility].  The reason for patient's recurrent PE is not clear though.  Patient is not on any medication.  No recent hospitalization.  No recent immobilization.  No significant family history of clotting disorder.  CT renal and CT chest both negative for any major concerning active  malignancy. 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 Recommend further conversation with family and patient and PCP with regards to goals of care and duration of the anticoagulation.  Leg cramps. PVD. Patient reports bilateral leg cramps. While performing the venous Dopplers patient was found to have left common saphenous artery vascular disease. Appears to be chronic in nature. Recommend outpatient follow-up with vascular surgery. Patient was on aspirin prior to coming to the hospital and now will be DOACs. In order to reduce the bleeding risk would recommend to stop taking the aspirin while Xarelto loading is ongoing and start aspirin 3 weeks after.  Dizziness. Etiology not clear. Blood pressure is adequate at home. Blood pressure medications were already being adjusted by the PCP. Orthostatic vitals is negative here in the hospital. No focal deficit. For now close follow-up with PCP recommended.  Essential hypertension. Frequent PVCs. Patient reports frequent episodes of dizziness. Telemetry shows frequent PVCs. Patient does have history of hypertension and is on Norvasc and HCTZ combination. For now I recommend to discontinue Norvasc and transition to Toprol-XL for PVC. I also recommend to hold HCTZ for 1 week and resume at a lower dose 12.5 mg daily. Follow-up with PCP.    Hyperlipidemia Currently not on medications.     Dementia ([place]) I will hold citalopram due to QT prolongation.     Iron deficiency anemia Monitor hematocrit and hemoglobin. Transfuse as needed.     Pulmonary nodule Optional follow-up in 12 months per radiology.  Consultants:  none  Procedures performed:  Echocardiogram   DISCHARGE MEDICATION: Allergies as of 12/02/2022   No Known Allergies      Medication List     STOP taking these medications    amLODipine 10 MG tablet Commonly known as: NORVASC   donepezil 10 MG  tablet Commonly known as: ARICEPT       TAKE these medications    acetaminophen 325 MG tablet Commonly known as: TYLENOL Take 2 tablets (650 mg total) by mouth every 6 (six) hours as needed for mild pain, moderate pain, fever or headache (or Fever >/= 101).   albuterol (2.5 MG/3ML) 0.083% nebulizer solution Commonly known as: PROVENTIL Take 3 mLs (2.5 mg total) by nebulization every 6 (six) hours as needed for wheezing or shortness of breath.   aspirin EC 81 MG tablet Take 1 tablet (81 mg total) by mouth daily. Start taking on: December 24, 2022 What changed:  medication strength These instructions start on December 24, 2022. If you are unsure what to do until then, ask your doctor or other care provider.   CENTRUM SILVER PO Take 1 tablet by mouth every morning.   citalopram 20 MG tablet Commonly known as: CELEXA Take 1 tablet (20 mg total) by mouth every morning.   cyanocobalamin 1000 MCG tablet Commonly known as: VITAMIN B12 Take 1,000 mcg by mouth every morning.   hydrochlorothiazide 12.5 MG tablet Commonly known as: HYDRODIURIL Take 1 tablet (12.5 mg total) by mouth every morning. Start taking on: December 11, 2022 What changed: These instructions start on December 11, 2022. If you are unsure what to do until then, ask your doctor or other care provider.   latanoprost 0.005 % ophthalmic solution Commonly known as: XALATAN Place 1 drop into both eyes every evening.   metoprolol succinate 25 MG 24 hr tablet Commonly known as: TOPROL-XL Take 1 tablet (25 mg total) by mouth daily. Start taking on: December 03, 2022   pantoprazole 40 MG tablet Commonly known as: PROTONIX Take 1 tablet (40 mg total) by mouth 2 (two) times daily.   Rivaroxaban Stater Pack (15 mg and 20 mg) Commonly known as: XARELTO STARTER PACK Follow package directions: Take one '15mg'$  tablet by mouth twice a day. On day 22, switch to one '20mg'$  tablet once a day. Take with food.       Disposition:  Home Diet recommendation: Cardiac diet  Discharge Exam: Vitals:   12/02/22 0454 12/02/22 1227 12/02/22 1229 12/02/22 1231  BP: 129/79 (!) 155/90 (!) 157/92 (!) 143/84  Pulse: (!) 55 (!) 56 63 71  Resp: '18 18 18 20  '$ Temp: 98.6 F (37 C) 98.4 F (36.9 C)    TempSrc: Oral Oral    SpO2: 94% 100% 93% 93%  Weight:      Height:       General: Appear in no distress; no visible Abnormal Neck Mass Or lumps, Conjunctiva normal Cardiovascular: S1 and S2 Present, no Murmur, Respiratory: good respiratory effort, Bilateral Air entry present and CTA, no Crackles, no wheezes Abdomen: Bowel Sound present, Non tender  Extremities: no Pedal edema Neurology: alert and oriented to time, place, and person  [facility] Weights   11/30/22 1926  Weight: 49.9 kg   Condition at discharge: stable  The results of significant diagnostics from this hospitalization (including imaging, microbiology, ancillary and laboratory) are listed below for reference.   Imaging Studies: VAS [place] LOWER EXTREMITY VENOUS (DVT)  Result Date: 12/02/2022  Lower Venous DVT Study Patient Name:  Ranetta Mesta  Date of Exam:   12/02/2022 Medical Rec #: [id number]       Accession #:    [id number] Date of Birth: 01-Jun-1933        Patient Gender: F Patient Age:   22 years Exam Location:  [facility] Procedure:      VAS [place] LOWER EXTREMITY VENOUS (DVT) Referring Phys: DAVID ORTIZ --------------------------------------------------------------------------------  Indications: Pulmonary embolism.  Risk Factors: DVT RLE 2021. Comparison Study: LLEV on 08/18/21 was negative for DVT. RLEV exam on 09/23/20 was                   positive for DVT Performing Technologist: Rogelia Rohrer RVT, RDMS  Examination Guidelines: A complete evaluation includes B-mode imaging, spectral Doppler, color Doppler, and power Doppler as needed of all accessible portions of each vessel. Bilateral testing is considered an integral part of a complete examination. Limited  examinations for reoccurring indications may be performed as noted. The reflux portion of the exam is performed with the patient in reverse Trendelenburg.  +---------+---------------+---------+-----------+----------+--------------+ RIGHT    CompressibilityPhasicitySpontaneityPropertiesThrombus Aging +---------+---------------+---------+-----------+----------+--------------+ CFV      Full           Yes      Yes                                 +---------+---------------+---------+-----------+----------+--------------+ SFJ      Full                                                        +---------+---------------+---------+-----------+----------+--------------+ FV Prox  Full           Yes      Yes                                 +---------+---------------+---------+-----------+----------+--------------+ FV Mid   Full           Yes      Yes                                 +---------+---------------+---------+-----------+----------+--------------+ FV DistalFull           Yes      Yes                                 +---------+---------------+---------+-----------+----------+--------------+ PFV      Full                                                        +---------+---------------+---------+-----------+----------+--------------+ POP      Full           Yes      Yes                                 +---------+---------------+---------+-----------+----------+--------------+ PTV      Full                                                        +---------+---------------+---------+-----------+----------+--------------+  PERO     Full                                                        +---------+---------------+---------+-----------+----------+--------------+ Incidental finding of arterial inflow disease with dampened monophasic waveforms of CFA, SFA, PopA, and distal calf vessels   +---------+---------------+---------+-----------+----------+--------------+ LEFT     CompressibilityPhasicitySpontaneityPropertiesThrombus Aging +---------+---------------+---------+-----------+----------+--------------+ CFV      Full           Yes      Yes                                 +---------+---------------+---------+-----------+----------+--------------+ SFJ      Full                                                        +---------+---------------+---------+-----------+----------+--------------+ FV Prox  Full           Yes      Yes                                 +---------+---------------+---------+-----------+----------+--------------+ FV Mid   Full           Yes      Yes                                 +---------+---------------+---------+-----------+----------+--------------+ FV DistalFull           Yes      Yes                                 +---------+---------------+---------+-----------+----------+--------------+ PFV      Full                                                        +---------+---------------+---------+-----------+----------+--------------+ POP      Full           Yes      Yes                                 +---------+---------------+---------+-----------+----------+--------------+ PTV      Full                                                        +---------+---------------+---------+-----------+----------+--------------+ PERO     Full                                                        +---------+---------------+---------+-----------+----------+--------------+  Incidental finding of arterial inflow disease with dampened monophasic waveforms of CFA. There is a short segment occlusion of SFA and severely dampened monophasic flow in distal calf vessels.    Summary: BILATERAL: - No evidence of deep vein thrombosis seen in the lower extremities, bilaterally. -No evidence of popliteal cyst, bilaterally.  -Incidental arterial findings - see technologist comments above.   *See table(s) above for measurements and observations. Electronically signed by Monica Martinez MD on 12/02/2022 at 1:51:04 PM.    Final    ECHOCARDIOGRAM COMPLETE  Result Date: 12/02/2022    ECHOCARDIOGRAM REPORT   Patient Name:   LOURENE Severtson Date of Exam: 12/02/2022 Medical Rec #:  [id number]      Height:       61.0 in Accession #:    [id number]     Weight:       110.0 lb Date of Birth:  07/21/1933       BSA:          1.465 m Patient Age:    89 years       BP:           129/97 mmHg Patient Gender: F              HR:           51 bpm. Exam Location:  Inpatient Procedure: Cardiac Doppler, Color Doppler and 2D Echo Indications:    I26.02 Pulmonary embolus  History:        Patient has prior history of Echocardiogram examinations, most                 recent 11/15/2020. Abnormal ECG, Arrythmias:PVC,                 Signs/Symptoms:Altered Mental Status, Alzheimer's, Chest Pain,                 Dizziness/Lightheadedness, Shortness of Breath and Dyspnea; Risk                 Factors:Hypertension, Dyslipidemia and Former Smoker.  Sonographer:    Roseanna Rainbow RDCS Referring Phys: [id number] [place]  Sonographer Comments: Technically difficult study due to poor echo windows. IMPRESSIONS  1. Low normal to mildy reduced LV function; EF 50.  2. Left ventricular ejection fraction, by estimation, is 50 to 55%. The left ventricle has low normal function. The left ventricle has no regional wall motion abnormalities. Left ventricular diastolic parameters are consistent with Grade I diastolic dysfunction (impaired relaxation).  3. Right ventricular systolic function is normal. The right ventricular size is normal. There is normal pulmonary artery systolic pressure.  4. The mitral valve is normal in structure. Trivial mitral valve regurgitation. No evidence of mitral stenosis.  5. The aortic valve is tricuspid. Aortic valve regurgitation is mild. Aortic valve  sclerosis is present, with no evidence of aortic valve stenosis.  6. The inferior vena cava is normal in size with greater than 50% respiratory variability, suggesting right atrial pressure of 3 mmHg. FINDINGS  Left Ventricle: Left ventricular ejection fraction, by estimation, is 50 to 55%. The left ventricle has low normal function. The left ventricle has no regional wall motion abnormalities. The left ventricular internal cavity size was normal in size. There is no left ventricular hypertrophy. Left ventricular diastolic parameters are consistent with Grade I diastolic dysfunction (impaired relaxation). Right Ventricle: The right ventricular size is normal. Right ventricular systolic function is normal. There is normal pulmonary artery systolic pressure. The tricuspid regurgitant velocity  is 2.52 m/s, and with an assumed right atrial pressure of 8 mmHg,  the estimated right ventricular systolic pressure is 13.2 mmHg. Left Atrium: Left atrial size was normal in size. Right Atrium: Right atrial size was normal in size. Pericardium: There is no evidence of pericardial effusion. Mitral Valve: The mitral valve is normal in structure. Trivial mitral valve regurgitation. No evidence of mitral valve stenosis. Tricuspid Valve: The tricuspid valve is normal in structure. Tricuspid valve regurgitation is mild . No evidence of tricuspid stenosis. Aortic Valve: The aortic valve is tricuspid. Aortic valve regurgitation is mild. Aortic regurgitation PHT measures 856 msec. Aortic valve sclerosis is present, with no evidence of aortic valve stenosis. Aortic valve mean gradient measures 4.0 mmHg. Aortic valve peak gradient measures 7.4 mmHg. Aortic valve area, by VTI measures 2.75 cm. Pulmonic Valve: The pulmonic valve was normal in structure. Pulmonic valve regurgitation is trivial. No evidence of pulmonic stenosis. Aorta: The aortic root is normal in size and structure. Venous: The inferior vena cava is normal in size with  greater than 50% respiratory variability, suggesting right atrial pressure of 3 mmHg. IAS/Shunts: No atrial level shunt detected by color flow Doppler. Additional Comments: Low normal to mildy reduced LV function; EF 50.  LEFT VENTRICLE PLAX 2D LVIDd:         3.50 cm     Diastology LVIDs:         2.50 cm     LV e' medial:    3.26 cm/s LV PW:         1.00 cm     LV E/e' medial:  15.9 LV IVS:        1.00 cm     LV e' lateral:   4.79 cm/s LVOT diam:     2.40 cm     LV E/e' lateral: 10.8 LV SV:         90 LV SV Index:   61 LVOT Area:     4.52 cm  LV Volumes (MOD) LV vol d, MOD A2C: 76.0 ml LV vol d, MOD A4C: 89.1 ml LV vol s, MOD A2C: 33.2 ml LV vol s, MOD A4C: 40.2 ml LV SV MOD A2C:     42.8 ml LV SV MOD A4C:     89.1 ml LV SV MOD BP:      46.3 ml RIGHT VENTRICLE             IVC RV S prime:     10.30 cm/s  IVC diam: 2.00 cm TAPSE (M-mode): 2.2 cm LEFT ATRIUM             Index        RIGHT ATRIUM           Index LA diam:        3.60 cm 2.46 cm/m   RA Area:     13.70 cm LA Vol (A2C):   36.8 ml 25.12 ml/m  RA Volume:   35.80 ml  24.44 ml/m LA Vol (A4C):   47.9 ml 32.70 ml/m LA Biplane Vol: 42.5 ml 29.01 ml/m  AORTIC VALVE                    PULMONIC VALVE AV Area (Vmax):    3.05 cm     PR End Diast Vel: 1.27 msec AV Area (Vmean):   2.90 cm AV Area (VTI):     2.75 cm AV Vmax:  136.00 cm/s AV Vmean:          86.900 cm/s AV VTI:            0.326 m AV Peak Grad:      7.4 mmHg AV Mean Grad:      4.0 mmHg LVOT Vmax:         91.55 cm/s LVOT Vmean:        55.700 cm/s LVOT VTI:          0.198 m LVOT/AV VTI ratio: 0.61 AI PHT:            856 msec  AORTA Ao Root diam: 3.00 cm Ao Asc diam:  3.20 cm MITRAL VALVE                TRICUSPID VALVE MV Area (PHT): 2.22 cm     TR Peak grad:   25.4 mmHg MV Decel Time: 342 msec     TR Vmax:        252.00 cm/s MV E velocity: 51.90 cm/s MV A velocity: 111.00 cm/s  SHUNTS MV E/A ratio:  0.47         Systemic VTI:  0.20 m                             Systemic Diam: 2.40 cm Kirk Ruths MD Electronically signed by Kirk Ruths MD Signature Date/Time: 12/02/2022/11:13:04 AM    Final    CT Angio Chest PE W and/or Wo Contrast  Result Date: 12/01/2022 CLINICAL DATA:  Chronic dyspnea, epigastric abdominal pain EXAM: CT ANGIOGRAPHY CHEST WITH CONTRAST TECHNIQUE: Multidetector CT imaging of the chest was performed using the standard protocol during bolus administration of intravenous contrast. Multiplanar CT image reconstructions and MIPs were obtained to evaluate the vascular anatomy. RADIATION DOSE REDUCTION: This exam was performed according to the departmental dose-optimization program which includes automated exposure control, adjustment of the mA and/or kV according to patient size and/or use of iterative reconstruction technique. CONTRAST:  89m OMNIPAQUE IOHEXOL 350 MG/ML SOLN COMPARISON:  10/13/2020 FINDINGS: Cardiovascular: There is adequate opacification of the pulmonary arterial tree. There is a single central intraluminal filling defect within a subsegmental branch of the right lower lobe at axial image # 67/5 in keeping with acute pulmonary embolism. No additional intraluminal filling defects identified. Central pulmonary arteries are dilated in keeping with changes of pulmonary arterial hypertension. No significant coronary artery calcification. Global cardiac size within normal limits. No pericardial effusion. Mild atherosclerotic calcification within the thoracic aorta. The proximal descending thoracic aorta is dilated measuring 3.5 cm in diameter just beyond the arch. Ascending aorta is of normal caliber. Distal descending aorta is of normal caliber. Mediastinum/Nodes: Visualized thyroid is unremarkable. No pathologic adenopathy within the thorax. Esophagus unremarkable. Lungs/Pleura: Mild emphysema. Mild bibasilar atelectasis. 3 mm nodule within the right apex, axial image # 12/6, indeterminate. No pneumothorax or pleural effusion. No focal pulmonary infiltrate. Central  airways are widely patent. Upper Abdomen: No acute abnormality. Musculoskeletal: Remote appearing superior endplate fracture of T8 with mild loss of height. No acute bone abnormality. Degenerative changes are seen within the shoulders bilaterally. No lytic or blastic bone lesion. Review of the MIP images confirms the above findings. IMPRESSION: 1. Tiny, subsegmental acute pulmonary embolism. No CT evidence of right heart strain. 2. Morphologic changes in keeping with pulmonary arterial hypertension. 3. Mild emphysema. 4. 3 mm right solid pulmonary nodule within the upper lobe. Per Fleischner Society Guidelines, a  non-contrast Chest CT at 12 months is optional. If performed and the nodule is stable at 12 months, no further follow-up is recommended. These guidelines do not apply to immunocompromised patients and patients with cancer. Follow up in patients with significant comorbidities as clinically warranted. For lung cancer screening, adhere to Lung-RADS guidelines. Reference: Radiology. 2017; 284(1):228-43. 5. Aortic atherosclerosis. Aortic Atherosclerosis (ICD10-I70.0) and Emphysema (ICD10-J43.9). Electronically Signed   By: Fidela Salisbury M.D.   On: 12/01/2022 02:05   DG Chest Portable 1 View  Result Date: 12/01/2022 CLINICAL DATA:  Epigastric chest pain for 2 days EXAM: PORTABLE CHEST 1 VIEW COMPARISON:  07/29/2021 FINDINGS: Stable cardiomegaly. Aortic atherosclerotic calcification. No focal consolidation, pleural effusion, or pneumothorax. Chronic bronchovascular crowding in the lower lungs. Pulmonary vascular congestion. No acute osseous abnormality. IMPRESSION: No change from 07/29/2021. Cardiomegaly and pulmonary vascular congestion. Electronically Signed   By: Placido Sou M.D.   On: 12/01/2022 00:29   CT Renal Stone Study  Result Date: 11/30/2022 CLINICAL DATA:  Abdominal/flank pain. Epigastric pain. Stones suspected EXAM: CT ABDOMEN AND PELVIS WITHOUT CONTRAST TECHNIQUE: Multidetector CT  imaging of the abdomen and pelvis was performed following the standard protocol without IV contrast. RADIATION DOSE REDUCTION: This exam was performed according to the departmental dose-optimization program which includes automated exposure control, adjustment of the mA and/or kV according to patient size and/or use of iterative reconstruction technique. COMPARISON:  07/29/2021 FINDINGS: Lower chest: Patchy airspace opacities in the lower lungs suspicious for bronchopneumonia. Hepatobiliary: Calcified hepatic granulomas gallbladder is unremarkable. No biliary dilation. Pancreas: Unremarkable. Spleen: Unremarkable. Adrenals/Urinary Tract: Right adrenal gland is normal. 2.5 cm left adrenal adenoma. No follow-up recommended. No urinary calculi or hydronephrosis. Unremarkable bladder. Stomach/Bowel: Colonic diverticulosis without diverticulitis. Normal caliber large and small bowel. Normal appendix. Vascular/Lymphatic: Extensive calcified atherosclerosis of the aorta and its branches. No lymphadenopathy. Reproductive: Hysterectomy. Other: Trace fluid in the pelvis.  No free intraperitoneal air. Musculoskeletal: No fracture is seen. IMPRESSION: No acute abnormality in the abdomen or pelvis. Patchy opacities in the lower lung suspicious for bronchopneumonia. Colonic diverticulosis without diverticulitis. Extensive calcified atherosclerosis of the aorta and its branches. Electronically Signed   By: Placido Sou M.D.   On: 11/30/2022 23:54    Microbiology: Results for orders placed or performed during the hospital encounter of 11/30/22  Blood culture (routine x 2)     Status: None (Preliminary result)   Collection Time: 12/01/22  1:00 AM   Specimen: BLOOD  Result Value Ref Range Status   Specimen Description   Final    BLOOD LEFT ANTECUBITAL Performed at [facility], [address] [place], [place] [address]    Special Requests   Final    BOTTLES DRAWN AEROBIC AND ANAEROBIC Blood Culture results  may not be optimal due to an excessive volume of blood received in culture bottles Performed at [facility], [address] [place], [place] [address]    Culture   Final    NO GROWTH < 24 HOURS Performed at [facility], [place] [address] [place], [place] [address]    Report Status PENDING  Incomplete  Blood culture (routine x 2)     Status: None (Preliminary result)   Collection Time: 12/01/22  1:06 AM   Specimen: BLOOD  Result Value Ref Range Status   Specimen Description   Final    BLOOD RIGHT ANTECUBITAL Performed at [facility], [address], [place], [place] [address]  Special Requests   Final    BOTTLES DRAWN AEROBIC AND ANAEROBIC Blood Culture adequate volume Performed at [facility], [address] [place], [place] [address]    Culture   Final    NO GROWTH < 24 HOURS Performed at [facility], [place] [address] [place], [place] [address]    Report Status PENDING  Incomplete  Resp panel by RT-PCR (RSV, Flu A&B, Covid) Anterior Nasal Swab     Status: None   Collection Time: 12/01/22  1:53 AM   Specimen: Anterior Nasal Swab  Result Value Ref Range Status   SARS Coronavirus 2 by RT PCR NEGATIVE NEGATIVE Final    Comment: (NOTE) SARS-CoV-2 target nucleic acids are NOT DETECTED.  The SARS-CoV-2 RNA is generally detectable in upper respiratory specimens during the acute phase of infection. The lowest concentration of SARS-CoV-2 viral copies this assay can detect is 138 copies/mL. A negative result does not preclude SARS-Cov-2 infection and should not be used as the sole basis for treatment or other patient management decisions. A negative result may occur with  improper specimen collection/handling, submission of specimen other than nasopharyngeal swab, presence of viral mutation(s) within the areas targeted by this assay, and inadequate number of viral copies(<138 copies/mL). A negative result must be combined  with clinical observations, patient history, and epidemiological information. The expected result is Negative.  Fact Sheet for Patients:  EntrepreneurPulse.com.au  Fact Sheet for Healthcare Providers:  IncredibleEmployment.be  This test is no t yet approved or cleared by the [place] FDA and  has been authorized for detection and/or diagnosis of SARS-CoV-2 by FDA under an Emergency Use Authorization (EUA). This EUA will remain  in effect (meaning this test can be used) for the duration of the COVID-19 declaration under Section 564(b)(1) of the Act, 21 U.S.C.section 360bbb-3(b)(1), unless the authorization is terminated  or revoked sooner.       Influenza A by PCR NEGATIVE NEGATIVE Final   Influenza B by PCR NEGATIVE NEGATIVE Final    Comment: (NOTE) The Xpert Xpress SARS-CoV-2/FLU/RSV plus assay is intended as an aid in the diagnosis of influenza from Nasopharyngeal swab specimens and should not be used as a sole basis for treatment. Nasal washings and aspirates are unacceptable for Xpert Xpress SARS-CoV-2/FLU/RSV testing.  Fact Sheet for Patients: EntrepreneurPulse.com.au  Fact Sheet for Healthcare Providers: IncredibleEmployment.be  This test is not yet approved or cleared by the [place] FDA and has been authorized for detection and/or diagnosis of SARS-CoV-2 by FDA under an Emergency Use Authorization (EUA). This EUA will remain in effect (meaning this test can be used) for the duration of the COVID-19 declaration under Section 564(b)(1) of the Act, 21 U.S.C. section 360bbb-3(b)(1), unless the authorization is terminated or revoked.     Resp Syncytial Virus by PCR NEGATIVE NEGATIVE Final    Comment: (NOTE) Fact Sheet for Patients: EntrepreneurPulse.com.au  Fact Sheet for Healthcare Providers: IncredibleEmployment.be  This test is not yet approved  or cleared by the [place] FDA and has been authorized for detection and/or diagnosis of SARS-CoV-2 by FDA under an Emergency Use Authorization (EUA). This EUA will remain in effect (meaning this test can be used) for the duration of the COVID-19 declaration under Section 564(b)(1) of the Act, 21 U.S.C. section 360bbb-3(b)(1), unless the authorization is terminated or revoked.  Performed at [facility], [address], [place], [place] [address]    Labs: CBC: Recent Labs  Lab 11/30/22 1931 12/02/22  0734  WBC 4.7 4.1  HGB 13.2 12.9  HCT 40.4 39.6  MCV 91.2 93.0  PLT 311 098   Basic Metabolic Panel: Recent Labs  Lab 11/30/22 1931 12/01/22 1815 12/02/22 0734  NA 135 138 137  K 3.1* 3.8 3.9  CL 99 100 102  CO2 '25 27 25  '$ GLUCOSE 87 100* 99  BUN '18 13 11  '$ CREATININE 0.91 0.72 0.65  CALCIUM 9.3 9.3 9.0  MG  --  2.3  --    Liver Function Tests: Recent Labs  Lab 11/30/22 1931 12/02/22 0734  AST 25 20  ALT 16 15  ALKPHOS 60 50  BILITOT 0.7 0.3  PROT 7.9 6.6  ALBUMIN 4.2 3.5   CBG: No results for input(s): "GLUCAP" in the last 168 hours.  Discharge time spent: greater than 30 minutes.  Signed: Berle Mull, MD [facility] 12/02/2022

## 2022-12-02 NOTE — Progress Notes (Incomplete)
  Echocardiogram 2D Echocardiogram has been performed.  Amanda [name] 12/02/2022, 11:04 AM

## 2022-12-04 NOTE — Progress Notes (Signed)
[facility] [place]  ASSESSMENT / PLAN: Amanda [name] is a 89 y.o. female with atherosclerosis of native arteries of bilateral lower extremities causing intermittent claudication.  Patient counseled patients with asymptomatic peripheral arterial disease or claudication have a 1-2% risk of developing chronic limb threatening ischemia, but a 15-30% risk of mortality in the next 5 years. Intervention should only be considered for medically optimized patients with disabling symptoms.   Recommend the following which can slow the progression of atherosclerosis and reduce the risk of major adverse cardiac / limb events:  Complete cessation from all tobacco products. Blood glucose control with goal A1c < 7%. Blood pressure control with goal blood pressure < 140/90 mmHg. Lipid reduction therapy with goal LDL-C <100 mg/dL (<29 if symptomatic from PAD).  Aspirin 81mg  PO QD.  Atorvastatin 40-80mg  PO QD (or other "high intensity" statin therapy).  Patient reports fairly active lifestyle.  Claudication does not seem to be limiting this as of yet.  Encouraged the patient to adhere to a walking regimen.  She should follow-up with me in 1 year with repeat noninvasive testing.  CHIEF COMPLAINT: Cramping discomfort in the legs  HISTORY OF PRESENT ILLNESS: Amanda [name] is a 89 y.o. female referred to clinic for evaluation of incidental discovery of peripheral arterial disease on noninvasive testing for deep venous thrombosis.  Patient was admitted 1/12 to 12/02/2022 for PE.  She was started on anticoagulation.  Workup was initiated including a venous duplex, which discovered incidental peripheral arterial disease.  The patient is fairly active.  She does report some occasion type symptoms.  She enjoys gardening, and is still active and working in her garden.  She does not have ischemic rest pain.  She has no ulcers about her feet.  Past Medical History:  Diagnosis Date   Depression     Hyperlipidemia    Hypertension    Memory disorder    Osteoporosis    PVC (premature ventricular contraction)     Past Surgical History:  Procedure Laterality Date   PARTIAL HYSTERECTOMY      Family History  Problem Relation Age of Onset   Lung cancer Neg Hx    Memory loss Neg Hx     Social History   Socioeconomic History   Marital status: Divorced    Spouse name: Not on file   Number of children: Not on file   Years of education: Not on file   Highest education level: Not on file  Occupational History   Not on file  Tobacco Use   Smoking status: Former    Years: 17.00    Types: Cigarettes    Start date: 10/29/1954    Quit date: 10/30/1971    Years since quitting: 51.1   Smokeless tobacco: Never   Tobacco comments:    smoked 3-4/day  Substance and Sexual Activity   Alcohol use: No   Drug use: No   Sexual activity: Not on file  Other Topics Concern   Not on file  Social History Narrative   Worked in mills.   Divorced 10    Lives alone   Son lives in town and two daughter lives out of town.       Social Determinants of Health   Financial Resource Strain: Not on file  Food Insecurity: No Food Insecurity (12/01/2022)   Hunger Vital Sign    Worried About Running Out of Food in the Last Year: Never true    Ran Out of Food in the  Last Year: Never true  Transportation Needs: No Transportation Needs (12/01/2022)   PRAPARE - Administrator, Civil Service (Medical): No    Lack of Transportation (Non-Medical): No  Physical Activity: Not on file  Stress: Not on file  Social Connections: Not on file  Intimate Partner Violence: Not At Risk (12/01/2022)   Humiliation, Afraid, Rape, and Kick questionnaire    Fear of Current or Ex-Partner: No    Emotionally Abused: No    Physically Abused: No    Sexually Abused: No    No Known Allergies  Current Outpatient Medications  Medication Sig Dispense Refill   acetaminophen (TYLENOL) 325 MG tablet Take 2  tablets (650 mg total) by mouth every 6 (six) hours as needed for mild pain, moderate pain, fever or headache (or Fever >/= 101).     albuterol (PROVENTIL) (2.5 MG/3ML) 0.083% nebulizer solution Take 3 mLs (2.5 mg total) by nebulization every 6 (six) hours as needed for wheezing or shortness of breath. 75 mL 5   [START ON 12/24/2022] aspirin EC 81 MG tablet Take 1 tablet (81 mg total) by mouth daily.     citalopram (CELEXA) 20 MG tablet Take 1 tablet (20 mg total) by mouth every morning. 30 tablet 0   cyanocobalamin (VITAMIN B12) 1000 MCG tablet Take 1,000 mcg by mouth every morning.     [START ON 12/11/2022] hydrochlorothiazide (HYDRODIURIL) 12.5 MG tablet Take 1 tablet (12.5 mg total) by mouth every morning. 20 tablet 0   latanoprost (XALATAN) 0.005 % ophthalmic solution Place 1 drop into both eyes every evening.     metoprolol succinate (TOPROL-XL) 25 MG 24 hr tablet Take 1 tablet (25 mg total) by mouth daily. 30 tablet 0   Multiple Vitamins-Minerals (CENTRUM SILVER PO) Take 1 tablet by mouth every morning.     pantoprazole (PROTONIX) 40 MG tablet Take 1 tablet (40 mg total) by mouth 2 (two) times daily. 60 tablet 0   RIVAROXABAN (XARELTO) VTE STARTER PACK (15 & 20 MG) Follow package directions: Take one 15mg  tablet by mouth twice a day. On day 22, switch to one 20mg  tablet once a day. Take with food. 51 each 0   No current facility-administered medications for this visit.    PHYSICAL EXAM Vitals:   12/05/22 1000  BP: (!) 166/86  Pulse: (!) 49  Temp: 97.9 F (36.6 C)  SpO2: 96%  Weight: 112 lb (50.8 kg)  Height: 5\' 1"  (1.549 m)    Early woman in no acute distress Regular rate and rhythm Unlabored breathing No palpable pedal pulses    PERTINENT LABORATORY AND RADIOLOGIC DATA  Most recent CBC    Latest Ref Rng & Units 12/02/2022    7:34 AM 11/30/2022    7:31 PM 08/01/2021    3:47 AM  CBC  WBC 4.0 - 10.5 K/uL 4.1  4.7  3.6   Hemoglobin 12.0 - 15.0 g/dL 13.0  86.5  78.4    Hematocrit 36.0 - 46.0 % 39.6  40.4  39.9   Platelets 150 - 400 K/uL 250  311  244      Most recent CMP    Latest Ref Rng & Units 12/02/2022    7:34 AM 12/01/2022    6:15 PM 11/30/2022    7:31 PM  CMP  Glucose 70 - 99 mg/dL 99  696  87   BUN 8 - 23 mg/dL 11  13  18    Creatinine 0.44 - 1.00 mg/dL 2.95  2.84  1.32  Sodium 135 - 145 mmol/L 137  138  135   Potassium 3.5 - 5.1 mmol/L 3.9  3.8  3.1   Chloride 98 - 111 mmol/L 102  100  99   CO2 22 - 32 mmol/L 25  27  25    Calcium 8.9 - 10.3 mg/dL 9.0  9.3  9.3   Total Protein 6.5 - 8.1 g/dL 6.6   7.9   Total Bilirubin 0.3 - 1.2 mg/dL 0.3   0.7   Alkaline Phos 38 - 126 U/L 50   60   AST 15 - 41 U/L 20   25   ALT 0 - 44 U/L 15   16     Renal function Estimated Creatinine Clearance: 37.4 mL/min (by C-G formula based on SCr of 0.65 mg/dL).  Hgb A1c MFr Bld (%)  Date Value  01/06/2015 6.1 (H)    LDL Cholesterol  Date Value Ref Range Status  01/06/2015 116 (H) 0 - 99 mg/dL Final    Comment:           Total Cholesterol/HDL:CHD Risk Coronary Heart Disease Risk Table                     Men   Women  1/2 Average Risk   3.4   3.3  Average Risk       5.0   4.4  2 X Average Risk   9.6   7.1  3 X Average Risk  23.4   11.0        Use the calculated Patient Ratio above and the CHD Risk Table to determine the patient's CHD Risk.        ATP III CLASSIFICATION (LDL):  <100     mg/dL   Optimal  161-096  mg/dL   Near or Above                    Optimal  130-159  mg/dL   Borderline  045-409  mg/dL   High  >811     mg/dL   Very High      +-------+-----------+-----------+------------+------------+  ABI/TBIToday's ABIToday's TBIPrevious ABIPrevious TBI  +-------+-----------+-----------+------------+------------+  Right 0.82       0.54       0.97        0.75          +-------+-----------+-----------+------------+------------+  Left  0.67       0.3        0.84        0.56           +-------+-----------+-----------+------------+------------+   Rande Brunt. Lenell Antu, MD FACS [facility] Phone Number: [phone] 12/04/2022 9:07 PM   Total time spent on preparing this encounter including chart review, data review, collecting history, examining the patient, coordinating care for this new patient, 60 minutes.  Portions of this report may have been transcribed using voice recognition software.  Every effort has been made to ensure accuracy; however, inadvertent computerized transcription errors may still be present.

## 2022-12-05 ENCOUNTER — Encounter: Payer: Self-pay | Admitting: Vascular Surgery

## 2022-12-05 ENCOUNTER — Ambulatory Visit (HOSPITAL_COMMUNITY)
Admission: RE | Admit: 2022-12-05 | Discharge: 2022-12-05 | Disposition: A | Discharge status: Home / Self Care | Payer: Medicare Other | Source: Ambulatory Visit | Attending: Internal Medicine | Admitting: Internal Medicine

## 2022-12-05 ENCOUNTER — Ambulatory Visit: Payer: Medicare Other | Admitting: Vascular Surgery

## 2022-12-05 VITALS — BP 166/86 | HR 49 | Temp 97.9°F | Ht 61.0 in | Wt 112.0 lb

## 2022-12-05 DIAGNOSIS — I70213 Atherosclerosis of native arteries of extremities with intermittent claudication, bilateral legs: Secondary | ICD-10-CM

## 2022-12-05 DIAGNOSIS — I739 Peripheral vascular disease, unspecified: Secondary | ICD-10-CM | POA: Insufficient documentation

## 2022-12-06 DIAGNOSIS — Z09 Encounter for follow-up examination after completed treatment for conditions other than malignant neoplasm: Secondary | ICD-10-CM | POA: Diagnosis not present

## 2022-12-06 DIAGNOSIS — K219 Gastro-esophageal reflux disease without esophagitis: Secondary | ICD-10-CM | POA: Diagnosis not present

## 2022-12-06 DIAGNOSIS — R911 Solitary pulmonary nodule: Secondary | ICD-10-CM | POA: Diagnosis not present

## 2022-12-06 DIAGNOSIS — I2699 Other pulmonary embolism without acute cor pulmonale: Secondary | ICD-10-CM | POA: Diagnosis not present

## 2022-12-06 DIAGNOSIS — I739 Peripheral vascular disease, unspecified: Secondary | ICD-10-CM | POA: Diagnosis not present

## 2022-12-06 DIAGNOSIS — D6869 Other thrombophilia: Secondary | ICD-10-CM | POA: Diagnosis not present

## 2022-12-06 LAB — CULTURE, BLOOD (ROUTINE X 2)
Culture: NO GROWTH
Culture: NO GROWTH
Special Requests: ADEQUATE

## 2022-12-08 DIAGNOSIS — M25511 Pain in right shoulder: Secondary | ICD-10-CM | POA: Diagnosis not present

## 2022-12-08 DIAGNOSIS — M25512 Pain in left shoulder: Secondary | ICD-10-CM | POA: Diagnosis not present

## 2022-12-08 LAB — VAS US ABI WITH/WO TBI
Left ABI: 0.67
Right ABI: 0.82

## 2022-12-15 DIAGNOSIS — R040 Epistaxis: Secondary | ICD-10-CM | POA: Diagnosis not present

## 2022-12-21 DIAGNOSIS — H6123 Impacted cerumen, bilateral: Secondary | ICD-10-CM | POA: Diagnosis not present

## 2022-12-26 ENCOUNTER — Ambulatory Visit: Payer: Medicare Other | Admitting: Adult Health

## 2022-12-26 ENCOUNTER — Encounter: Payer: Self-pay | Admitting: Adult Health

## 2022-12-26 VITALS — BP 98/72 | HR 81 | Ht 61.0 in | Wt 108.2 lb

## 2022-12-26 DIAGNOSIS — F02B0 Dementia in other diseases classified elsewhere, moderate, without behavioral disturbance, psychotic disturbance, mood disturbance, and anxiety: Secondary | ICD-10-CM | POA: Diagnosis not present

## 2022-12-26 DIAGNOSIS — G309 Alzheimer's disease, unspecified: Secondary | ICD-10-CM

## 2022-12-26 NOTE — Progress Notes (Signed)
PATIENT: Amanda [name] DOB: September 07, 1933  REASON FOR VISIT: follow up HISTORY FROM: patient, granddaughter  PRIMARY NEUROLOGIST: Dr. Rexene Alberts  Chief Complaint  Patient presents with   Follow-up    Pt in 12 with granddaughter Granddaughter states short term memory has declined  Granddaughter states no questions or concerns for todays  visit     HISTORY OF PRESENT ILLNESS: Today 12/26/22:  Amanda [name] is a 89 y.o. female with a history of Alzheimer's disease. Returns today for follow-up.  She is here today with her granddaughter.  Her granddaughter feels that short-term memory has declined.  Patient continues to live in her home alone.  She is able to complete all ADLs independently.  She manages her own medication use and at pill pack.  Reports good appetite.  Denies any trouble sleeping.  Denies hallucinations.  No change in mood or behavior.    07/07/22:Amanda [name] is an 89 year old female with a history of memory disturbance.  She returns today for follow-up.  She reports that her memory has remained stable.  She Lives home alone. Able to complete all ADLs independently. Able to prepare her own meals. She manages her medications- uses a pill pack. If not in the pack she will forget to take. Good appetite. Sleeping ok. Family reports that she can be short-tempered at times. Denies hallucinations.  She was on Namenda but was having dizziness.  Medication was discontinued.  Granddaughter reports that she still complains of dizziness not sure that the medication was the cause.  11/28/2021: Amanda [name] is an 89 year old female with a history of memory disturbance.  She returns today for follow-up.  She is here today with her granddaughter.  She reports that she continues to live at home alone.  She does have family that checks on her daily or every other day.  Her medications come in a pill pack.  Her family reminds her of her appointments.  Reports that she has a good appetite.  She reports that she  has not been driving because the car was broke but she plans to resume and drive to the post office.  She was advised at her last office visit that she should not be driving.  She returns today for an evaluation.  HISTORY (copied from Dr. Guadelupe Sabin note) 05/16/21: She reports doing fairly well, no recent falls, denies any loss of appetite, weight has been fairly stable within 2 to 3 pounds range.  Her granddaughters check on her.  She has 4 granddaughters, these are Blondine's daughters.  Patient's other daughter is in [place] and visits frequently.  Her daughter in [place] does not have any kids.  Patient drives to her grocery store or post office, she does cook some food on her stove.  She lives alone.  Daughter reports that she was summoned for jury duty for this week but they are going to address this with the court house today.  She is tolerating the memantine 5 mg twice daily.  She has had intermittent dizziness but denies any significant lightheadedness.  Symptoms come and go, she denies any specific symptoms such as spinning sensation.  Sometimes she wonders if it is her nerves and she gets anxious at times.   The patient's allergies, current medications, family history, past medical history, past social history, past surgical history and problem list were reviewed and updated as appropriate.   REVIEW OF SYSTEMS: Out of a complete 14 system review of symptoms, the patient complains only of the following symptoms, and  all other reviewed systems are negative.  ALLERGIES: No Known Allergies  HOME MEDICATIONS: Outpatient Medications Prior to Visit  Medication Sig Dispense Refill   acetaminophen (TYLENOL) 325 MG tablet Take 2 tablets (650 mg total) by mouth every 6 (six) hours as needed for mild pain, moderate pain, fever or headache (or Fever >/= 101).     albuterol (PROVENTIL) (2.5 MG/3ML) 0.083% nebulizer solution Take 3 mLs (2.5 mg total) by nebulization every 6 (six) hours as needed for  wheezing or shortness of breath. 75 mL 5   citalopram (CELEXA) 20 MG tablet Take 1 tablet (20 mg total) by mouth every morning. 30 tablet 0   cyanocobalamin (VITAMIN B12) 1000 MCG tablet Take 1,000 mcg by mouth every morning.     donepezil (ARICEPT) 10 MG tablet Take 10 mg by mouth at bedtime.     latanoprost (XALATAN) 0.005 % ophthalmic solution Place 1 drop into both eyes every evening.     metoprolol succinate (TOPROL-XL) 25 MG 24 hr tablet Take 1 tablet (25 mg total) by mouth daily. 30 tablet 0   Multiple Vitamins-Minerals (CENTRUM SILVER PO) Take 1 tablet by mouth every morning.     pantoprazole (PROTONIX) 40 MG tablet Take 1 tablet (40 mg total) by mouth 2 (two) times daily. 60 tablet 0   RIVAROXABAN (XARELTO) VTE STARTER PACK (15 & 20 MG) Follow package directions: Take one '15mg'$  tablet by mouth twice a day. On day 22, switch to one '20mg'$  tablet once a day. Take with food. 51 each 0   aspirin EC 81 MG tablet Take 1 tablet (81 mg total) by mouth daily. (Patient not taking: Reported on 12/05/2022)     hydrochlorothiazide (HYDRODIURIL) 12.5 MG tablet Take 1 tablet (12.5 mg total) by mouth every morning. 20 tablet 0   No facility-administered medications prior to visit.    PAST MEDICAL HISTORY: Past Medical History:  Diagnosis Date   Depression    DVT (deep venous thrombosis) (HCC)    Hyperlipidemia    Hypertension    Memory disorder    Osteoporosis    PVC (premature ventricular contraction)     PAST SURGICAL HISTORY: Past Surgical History:  Procedure Laterality Date   PARTIAL HYSTERECTOMY      FAMILY HISTORY: Family History  Problem Relation Age of Onset   Alzheimer's disease Mother    Lung cancer Neg Hx    Memory loss Neg Hx     SOCIAL HISTORY: Social History   Socioeconomic History   Marital status: Divorced    Spouse name: Not on file   Number of children: Not on file   Years of education: Not on file   Highest education level: Not on file  Occupational History    Not on file  Tobacco Use   Smoking status: Former    Years: 17.00    Types: Cigarettes    Start date: 10/29/1954    Quit date: 10/30/1971    Years since quitting: 51.1   Smokeless tobacco: Never   Tobacco comments:    smoked 3-4/day  Substance and Sexual Activity   Alcohol use: No   Drug use: No   Sexual activity: Not on file  Other Topics Concern   Not on file  Social History Narrative   Worked in mills.   Divorced 10    Lives alone   Son lives in town and two daughter lives out of town.       Social Determinants of Health   Financial Resource Strain:  Not on file  Food Insecurity: No Food Insecurity (12/01/2022)   Hunger Vital Sign    Worried About Running Out of Food in the Last Year: Never true    Ran Out of Food in the Last Year: Never true  Transportation Needs: No Transportation Needs (12/01/2022)   PRAPARE - Hydrologist (Medical): No    Lack of Transportation (Non-Medical): No  Physical Activity: Not on file  Stress: Not on file  Social Connections: Not on file  Intimate Partner Violence: Not At Risk (12/01/2022)   Humiliation, Afraid, Rape, and Kick questionnaire    Fear of Current or Ex-Partner: No    Emotionally Abused: No    Physically Abused: No    Sexually Abused: No      PHYSICAL EXAM  Vitals:   12/26/22 1449  BP: 98/72  Pulse: 81  Weight: 108 lb 3.2 oz (49.1 kg)  Height: '5\' 1"'$  (1.549 m)   Body mass index is 20.44 kg/m.     12/26/2022    2:53 PM 07/07/2022    9:38 AM 11/28/2021   10:47 AM  MMSE - Mini Mental State Exam  Orientation to time '1 2 2  '$ Orientation to Place '4 4 4  '$ Registration '3 3 3  '$ Attention/ Calculation 0 0 1  Recall 0 0 1  Language- name 2 objects '1 2 2  '$ Language- repeat 1 1 0  Language- follow 3 step command '3 3 3  '$ Language- read & follow direction '1 1 1  '$ Write a sentence '1 1 1  '$ Copy design 0 1 0  Total score '15 18 18     '$ Generalized: Well developed, in no acute distress    Neurological examination  Mentation: Alert oriented to time, place, history taking. Follows all commands speech and language fluent Cranial nerve II-XII: Pupils were equal round reactive to light. Extraocular movements were full, visual field were full on confrontational test. Facial sensation and strength were normal. Uvula tongue midline. Head turning and shoulder shrug  were normal and symmetric. Motor: The motor testing reveals 5 over 5 strength of all 4 extremities. Good symmetric motor tone is noted throughout.  Sensory: Sensory testing is intact to soft touch on all 4 extremities. No evidence of extinction is noted.  Coordination: Cerebellar testing reveals good finger-nose-finger and heel-to-shin bilaterally.  Gait and station: Gait is normal. Reflexes: Deep tendon reflexes are symmetric and normal bilaterally.   DIAGNOSTIC DATA (LABS, IMAGING, TESTING) - I reviewed patient records, labs, notes, testing and imaging myself where available.  Lab Results  Component Value Date   WBC 4.1 12/02/2022   HGB 12.9 12/02/2022   HCT 39.6 12/02/2022   MCV 93.0 12/02/2022   PLT 250 12/02/2022      Component Value Date/Time   NA 137 12/02/2022 0734   K 3.9 12/02/2022 0734   CL 102 12/02/2022 0734   CO2 25 12/02/2022 0734   GLUCOSE 99 12/02/2022 0734   BUN 11 12/02/2022 0734   CREATININE 0.65 12/02/2022 0734   CALCIUM 9.0 12/02/2022 0734   PROT 6.6 12/02/2022 0734   ALBUMIN 3.5 12/02/2022 0734   AST 20 12/02/2022 0734   ALT 15 12/02/2022 0734   ALKPHOS 50 12/02/2022 0734   BILITOT 0.3 12/02/2022 0734   GFRNONAA >60 12/02/2022 0734   GFRAA >60 05/04/2020 1237   Lab Results  Component Value Date   CHOL 167 01/06/2015   HDL 40 01/06/2015   LDLCALC 116 (H) 01/06/2015  TRIG 53 01/06/2015   CHOLHDL 4.2 01/06/2015   Lab Results  Component Value Date   HGBA1C 6.1 (H) 01/06/2015   No results found for: "VITAMINB12" Lab Results  Component Value Date   TSH 0.88 03/01/2016       ASSESSMENT AND PLAN 89 y.o. year old female  has a past medical history of Depression, DVT (deep venous thrombosis) ([place]), Hyperlipidemia, Hypertension, Memory disorder, Osteoporosis, and PVC (premature ventricular contraction). here with :  1.  Dementia without behavioral disturbance  Continue Aricept 10 mg daily MMSE 15/30 Previously 18/30 Advised if symptoms worsen or she develops new symptoms she should let us know Follow-up in 6 months or sooner if needed     Ward Givens, MSN, NP-C 12/26/2022, 2:56 PM [facility] [address] [place], [place] [address] [phone]

## 2022-12-26 NOTE — Patient Instructions (Signed)
Your Plan:  Continue Aricept memory score is stable If your symptoms worsen or you develop new symptoms please let us know.       Thank you for coming to see [place] at [facility]. I hope we have been able to provide you high quality care today.  You may receive a patient satisfaction survey over the next few weeks. We would appreciate your feedback and comments so that we may continue to improve ourselves and the health of our patients.

## 2022-12-29 ENCOUNTER — Encounter (HOSPITAL_COMMUNITY): Payer: Self-pay

## 2022-12-29 ENCOUNTER — Ambulatory Visit (HOSPITAL_COMMUNITY)
Admission: EM | Admit: 2022-12-29 | Discharge: 2022-12-29 | Disposition: A | Discharge status: Home / Self Care | Payer: Medicare Other | Attending: Emergency Medicine | Admitting: Emergency Medicine

## 2022-12-29 DIAGNOSIS — Z86711 Personal history of pulmonary embolism: Secondary | ICD-10-CM | POA: Diagnosis not present

## 2022-12-29 DIAGNOSIS — R051 Acute cough: Secondary | ICD-10-CM | POA: Diagnosis not present

## 2022-12-29 DIAGNOSIS — Z87891 Personal history of nicotine dependence: Secondary | ICD-10-CM | POA: Diagnosis not present

## 2022-12-29 DIAGNOSIS — J449 Chronic obstructive pulmonary disease, unspecified: Secondary | ICD-10-CM | POA: Insufficient documentation

## 2022-12-29 DIAGNOSIS — Z1152 Encounter for screening for COVID-19: Secondary | ICD-10-CM | POA: Diagnosis not present

## 2022-12-29 DIAGNOSIS — R062 Wheezing: Secondary | ICD-10-CM | POA: Insufficient documentation

## 2022-12-29 DIAGNOSIS — F0390 Unspecified dementia without behavioral disturbance: Secondary | ICD-10-CM | POA: Diagnosis not present

## 2022-12-29 DIAGNOSIS — Z7901 Long term (current) use of anticoagulants: Secondary | ICD-10-CM | POA: Diagnosis not present

## 2022-12-29 LAB — SARS CORONAVIRUS 2 (TAT 6-24 HRS): SARS Coronavirus 2: NEGATIVE

## 2022-12-29 MED ORDER — BENZONATATE 100 MG PO CAPS: 100.0000 mg | ORAL_CAPSULE | Freq: Three times a day (TID) | ORAL | 0 refills | Status: DC | PRN

## 2022-12-29 MED ORDER — IPRATROPIUM-ALBUTEROL 0.5-2.5 (3) MG/3ML IN SOLN
RESPIRATORY_TRACT | Status: AC
  Filled 2022-12-29: qty 3

## 2022-12-29 MED ORDER — IPRATROPIUM-ALBUTEROL 0.5-2.5 (3) MG/3ML IN SOLN
3.0000 mL | Freq: Once | RESPIRATORY_TRACT | Status: AC
  Administered 2022-12-29: 3 mL via RESPIRATORY_TRACT

## 2022-12-29 MED ORDER — PREDNISONE 20 MG PO TABS: 40.0000 mg | ORAL_TABLET | Freq: Every day | ORAL | 0 refills | Status: AC

## 2022-12-29 MED ORDER — PREDNISONE 20 MG PO TABS: 40.0000 mg | ORAL_TABLET | Freq: Every day | ORAL | 0 refills | Status: DC

## 2022-12-29 NOTE — ED Triage Notes (Signed)
Pt states productive cough for the past 2 days. Granddaughter states the sputum is yellow/green.

## 2022-12-29 NOTE — ED Provider Notes (Signed)
[facility]    CSN: [id number] Arrival date & time: 12/29/22  0957     History   Chief Complaint Chief Complaint  Patient presents with   Cough    HPI Amanda [name] is a 89 y.o. female.  Hx of dementia - granddaughter helps provide history  Presents with 2 day history of cough, chest congestion Reports chest is sore with coughing. Denies chest pain at rest Cough is productive of yellow/green sputum. Patient showed this provider a tissue with some light green mucous No shortness of breath. No fever  Hx COPD. Reports feeling "wheezy". Has not used nebulizer. Has not taken any cough medicines No known sick contacts  Recently had small PE - started on xarelto   Past Medical History:  Diagnosis Date   Depression    DVT (deep venous thrombosis) (HCC)    Hyperlipidemia    Hypertension    Memory disorder    Osteoporosis    PVC (premature ventricular contraction)     Patient Active Problem List   Diagnosis Date Noted   Acute pulmonary embolism ([place]) 12/01/2022   Pulmonary nodule 12/01/2022   COVID-19 virus infection 07/29/2021   Personal history of colonic polyps 03/21/2021   Plantar wart of right foot 03/21/2021   Recurrent falls 03/21/2021   Recurrent major depression in remission ([place]) 03/21/2021   Urinary incontinence 03/21/2021   Vitamin B12 deficiency 03/21/2021   Abnormal gait 02/25/2021   Centrilobular emphysema ([place]) 02/25/2021   Chronic pain 02/25/2021   Dementia ([place]) 02/25/2021   Dizzy 02/25/2021   Frailty 02/25/2021   Hardening of the aorta (main artery of the heart) ([place]) 02/25/2021   Hemoglobin low 02/25/2021   Iron deficiency anemia 02/25/2021   Loss of appetite 02/25/2021   Malnutrition of moderate degree ([place]: 60% to less than 75% of standard weight) ([place]) 02/25/2021   Mild major depression, single episode ([place]) 02/25/2021   Localized, primary osteoarthritis of shoulder region 02/25/2021   Repeated falls 09/16/2020   Unspecified  dementia, unspecified severity, without behavioral disturbance, psychotic disturbance, mood disturbance, and anxiety ([place]) 09/15/2020   DOE (dyspnea on exertion) 12/11/2019   Educated about COVID-19 virus infection 12/11/2019   Age-related osteoporosis without current pathological fracture 11/21/2019   Migraine, unspecified, not intractable, without status migrainosus 11/21/2019   Moderate protein-calorie malnutrition ([place]) 11/21/2019   Personal history of nicotine dependence 11/21/2019   Peripheral vascular disease, unspecified ([place]) 11/21/2019   Loss of weight 03/01/2016   Hyperlipidemia 10/25/2015   PVC (premature ventricular contraction) 01/21/2015   Hypertensive cardiovascular disease 01/21/2015   Chest pain 01/05/2015   HTN (hypertension) 01/05/2015    Past Surgical History:  Procedure Laterality Date   PARTIAL HYSTERECTOMY      OB History   No obstetric history on file.      Home Medications    Prior to Admission medications   Medication Sig Start Date End Date Taking? Authorizing Provider  aspirin EC 81 MG tablet Take 1 tablet (81 mg total) by mouth daily. 12/24/22  Yes Lavina Hamman, MD  acetaminophen (TYLENOL) 325 MG tablet Take 2 tablets (650 mg total) by mouth every 6 (six) hours as needed for mild pain, moderate pain, fever or headache (or Fever >/= 101). 12/02/22   Lavina Hamman, MD  albuterol (PROVENTIL) (2.5 MG/3ML) 0.083% nebulizer solution Take 3 mLs (2.5 mg total) by nebulization every 6 (six) hours as needed for wheezing or shortness of breath. 10/11/21   Spero Geralds, MD  benzonatate [facility])  100 MG capsule Take 1 capsule (100 mg total) by mouth 3 (three) times daily as needed for cough. 12/29/22   Sakira Dahmer, Wells Guiles, PA-C  citalopram (CELEXA) 20 MG tablet Take 1 tablet (20 mg total) by mouth every morning. 12/02/22   Lavina Hamman, MD  cyanocobalamin (VITAMIN B12) 1000 MCG tablet Take 1,000 mcg by mouth every morning.    [provider]  donepezil  (ARICEPT) 10 MG tablet Take 10 mg by mouth at bedtime.    [provider]  latanoprost (XALATAN) 0.005 % ophthalmic solution Place 1 drop into both eyes every evening.    [provider]  metoprolol succinate (TOPROL-XL) 25 MG 24 hr tablet Take 1 tablet (25 mg total) by mouth daily. 12/03/22   Lavina Hamman, MD  Multiple Vitamins-Minerals (CENTRUM SILVER PO) Take 1 tablet by mouth every morning.    [provider]  pantoprazole (PROTONIX) 40 MG tablet Take 1 tablet (40 mg total) by mouth 2 (two) times daily. 12/02/22   Lavina Hamman, MD  predniSONE (DELTASONE) 20 MG tablet Take 2 tablets (40 mg total) by mouth daily with breakfast for 5 days. 12/29/22 01/03/23  Melisa Donofrio, Wells Guiles, PA-C  RIVAROXABAN [employer]) VTE STARTER PACK (15 & 20 MG) Follow package directions: Take one 34m tablet by mouth twice a day. On day 22, switch to one 238mtablet once a day. Take with food. 12/02/22   PaLavina HammanMD    Family History Family History  Problem Relation Age of Onset   Alzheimer's disease Mother    Lung cancer Neg Hx    Memory loss Neg Hx     Social History Social History   Tobacco Use   Smoking status: Former    Years: 17.00    Types: Cigarettes    Start date: 10/29/1954    Quit date: 10/30/1971    Years since quitting: 51.2   Smokeless tobacco: Never   Tobacco comments:    smoked 3-4/day  Substance Use Topics   Alcohol use: No   Drug use: No     Allergies   Patient has no known allergies.   Review of Systems Review of Systems  Respiratory:  Positive for cough.    As per HPI  Physical Exam Triage Vital Signs ED Triage Vitals  Enc Vitals Group     BP 12/29/22 1103 (!) 150/71     Pulse Rate 12/29/22 1103 74     Resp 12/29/22 1103 16     Temp 12/29/22 1103 98 F (36.7 C)     Temp Source 12/29/22 1103 Oral     SpO2 12/29/22 1103 93 %     Weight --      Height --      Head Circumference --      Peak Flow --      Pain Score 12/29/22 1105 0      Pain Loc --      Pain Edu? --      Excl. in [place]--    No data found.  Updated Vital Signs BP (!) 150/71 (BP Location: Right Arm)   Pulse 74   Temp 98 F (36.7 C) (Oral)   Resp 16   SpO2 93%   Physical Exam Vitals and nursing note reviewed.  Constitutional:      General: She is not in acute distress. HENT:     Nose: No congestion or rhinorrhea.     Mouth/Throat:     Mouth: Mucous membranes are  moist.     Pharynx: Oropharynx is clear. No posterior oropharyngeal erythema.  Eyes:     Conjunctiva/sclera: Conjunctivae normal.  Cardiovascular:     Rate and Rhythm: Normal rate and regular rhythm.     Pulses: Normal pulses.     Heart sounds: Normal heart sounds.  Pulmonary:     Effort: Pulmonary effort is normal.     Breath sounds: Wheezing present.     Comments: Exp wheezing throughout. Normal work of breathing.  Musculoskeletal:     Cervical back: Normal range of motion.  Lymphadenopathy:     Cervical: No cervical adenopathy.  Skin:    General: Skin is warm and dry.  Neurological:     Mental Status: She is alert. Mental status is at baseline.     Comments: Baseline per granddaughter      UC Treatments / Results  Labs (all labs ordered are listed, but only abnormal results are displayed) Labs Reviewed  SARS CORONAVIRUS 2 (TAT 6-24 HRS)    EKG  Radiology No results found.  Procedures Procedures   Medications Ordered in UC Medications  ipratropium-albuterol (DUONEB) 0.5-2.5 (3) MG/3ML nebulizer solution 3 mL (3 mLs Nebulization Given 12/29/22 1141)    Initial Impression / Assessment and Plan / UC Course  I have reviewed the triage vital signs and the nursing notes.  Pertinent labs & imaging results that were available during my care of the patient were reviewed by me and considered in my medical decision making (see chart for details).  DuoNeb given, patient reports she is feeling much better Discussed viral etiology vs start of COPD exacerbation Recommend  neb at home q6 hours for the next 3-4 days Prednisone 40 mg daily x 5 days Tessalon TID PRN  No indication for abx at this time - does not meet criteria (no dyspnea, increased sputum volume or purulence). Discussed signs to look for that would warrant return for re-evaluation  Covid test pending - pt and family understand not a candidate for antivirals given xarelto use  Return precautions discussed. Patient and family agrees to plan  Final Clinical Impressions(s) / UC Diagnoses   Final diagnoses:  Acute cough  Wheezing     Discharge Instructions      Please use your nebulizer 3 times daily (every 6 hours) for the next 3-4 days. Then continue as needed. Take the prednisone as prescribed, once daily for 5 days. You can use the tessalon three times daily for cough.  We will call you if your covid test returns positive.   Please follow with your primary care provider.      ED Prescriptions     Medication Sig Dispense Auth. Provider   predniSONE (DELTASONE) 20 MG tablet  (Status: Discontinued) Take 2 tablets (40 mg total) by mouth daily with breakfast for 5 days. 10 tablet Jaonna Word, PA-C   benzonatate (TESSALON) 100 MG capsule  (Status: Discontinued) Take 1 capsule (100 mg total) by mouth 3 (three) times daily as needed for cough. 21 capsule Aloys Hupfer, PA-C   benzonatate (TESSALON) 100 MG capsule Take 1 capsule (100 mg total) by mouth 3 (three) times daily as needed for cough. 21 capsule Orvin Netter, PA-C   predniSONE (DELTASONE) 20 MG tablet Take 2 tablets (40 mg total) by mouth daily with breakfast for 5 days. 10 tablet Wreatha Sturgeon, Wells Guiles, PA-C      PDMP not reviewed this encounter.   Les Pou, Hershal Coria 12/29/22 1237

## 2022-12-29 NOTE — Discharge Instructions (Addendum)
Please use your nebulizer 3 times daily (every 6 hours) for the next 3-4 days. Then continue as needed. Take the prednisone as prescribed, once daily for 5 days. You can use the tessalon three times daily for cough.  We will call you if your covid test returns positive.   Please follow with your primary care provider.

## 2023-01-05 DIAGNOSIS — Z9181 History of falling: Secondary | ICD-10-CM | POA: Diagnosis not present

## 2023-01-05 DIAGNOSIS — F0393 Unspecified dementia, unspecified severity, with mood disturbance: Secondary | ICD-10-CM | POA: Diagnosis not present

## 2023-01-05 DIAGNOSIS — Z0000 Encounter for general adult medical examination without abnormal findings: Secondary | ICD-10-CM | POA: Diagnosis not present

## 2023-01-05 DIAGNOSIS — Z79899 Other long term (current) drug therapy: Secondary | ICD-10-CM | POA: Diagnosis not present

## 2023-01-11 ENCOUNTER — Other Ambulatory Visit: Payer: Self-pay

## 2023-01-11 DIAGNOSIS — F0390 Unspecified dementia without behavioral disturbance: Secondary | ICD-10-CM

## 2023-01-15 ENCOUNTER — Telehealth: Payer: Self-pay | Admitting: *Deleted

## 2023-01-15 NOTE — Progress Notes (Signed)
  Care Coordination   Note   01/15/2023 Name: Amanda [name] MRN: [id number] DOB: [birth year]  Amanda [name] is a 89 y.o. year old female who sees Amanda Macadam, MD for primary care. I reached out to Amanda [name] by phone today to offer care coordination services.  Amanda [name] was given information about Care Coordination services today including:   The Care Coordination services include support from the care team which includes your Nurse Coordinator, Clinical Social Worker, or Pharmacist.  The Care Coordination team is here to help remove barriers to the health concerns and goals most important to you. Care Coordination services are voluntary, and the patient may decline or stop services at any time by request to their care team member.   Care Coordination Consent Status: Patient agreed to services and verbal consent obtained.   Follow up plan:  Telephone appointment with care coordination team member scheduled for:  01/17/23  Encounter Outcome:  Pt. Scheduled  [place]  Direct Dial: [phone]

## 2023-01-17 ENCOUNTER — Ambulatory Visit: Payer: Self-pay | Admitting: *Deleted

## 2023-01-17 DIAGNOSIS — I10 Essential (primary) hypertension: Secondary | ICD-10-CM | POA: Diagnosis not present

## 2023-01-17 DIAGNOSIS — D649 Anemia, unspecified: Secondary | ICD-10-CM | POA: Diagnosis not present

## 2023-01-17 DIAGNOSIS — F0390 Unspecified dementia without behavioral disturbance: Secondary | ICD-10-CM

## 2023-01-17 NOTE — Patient Outreach (Signed)
  Care Coordination   Initial Visit Note   01/17/2023 Name: Amanda [name] MRN: [id number] DOB: 1933/04/11  Amanda [name] is a 89 y.o. year old female who sees Caren Macadam, MD for primary care. I  spoke with granddaughter/HCPOA, Amanda [name], by phone today.  What matters to the patients health and wellness today?  "Need to get her out of the house some"    Goals Addressed             This Visit's Progress    Provide resources and support to optimize health and well-being       Care Coordination Interventions and Goals: Family reports pt with "mild" dementia- able to live alone; however, family concerned about pt getting overly "focused" and fixated on neighbors,etc and causing anxiety- would like to pursue options to support "getting her out of the house some" Reviewed Care Coordination Services:  Assessed Social Determinants of Health Made referral to Care Guide to assist with: resources for adult day care programs and senior center activities/offerings Advised family to  expect email with resources to review and consider- may even want to go tour an Adult Day Care program setting to see if a good fit   SCAT transportation (application to be emailed to granddaughter)  Assessed needs, level of care concerns, how currently meeting needs and barriers to care-lives alone and has family support. Pill packs set up, patient can prepare meals and family assists too. Transportation provided by family as pt does not drive Discuss community support options ( Adult day care, senior centers, Retirement/ALF community, etc) Discussed pay options for personal care needs ( not eligible for Medicaid per family) PACE Program (discussed option for private pay since not Medicaid eligible)  Solution-Focused Strategies employed            SDOH assessments and interventions completed:  Yes  SDOH Interventions Today    Flowsheet Row Most Recent Value  SDOH Interventions   Food Insecurity  Interventions Intervention Not Indicated  Housing Interventions Intervention Not Indicated  Transportation Interventions Intervention Not Indicated  Financial Strain Interventions Intervention Not Indicated        Care Coordination Interventions:  Yes, provided  Interventions Today    Flowsheet Row Most Recent Value  Chronic Disease   Chronic disease during today's visit Hypertension (HTN), Other  [dementia]  General Interventions   General Interventions Discussed/Reviewed General Interventions Discussed, General Interventions Reviewed, [place] Discussed/Reviewed Coping Strategies  Safety Interventions   Safety Discussed/Reviewed Safety Discussed  Advanced Directive Interventions   Advanced Directives Discussed/Reviewed Advanced Directives Discussed  [Granddaughter, Amanda [name] and daughter, Amanda [name] are HCPOA]      Follow up plan: Follow up call scheduled for 01/30/23    Encounter Outcome:  Pt. Visit Completed

## 2023-01-17 NOTE — Patient Instructions (Signed)
Visit Information  Thank you for taking time to visit with me today. Please don't hesitate to contact me if I can be of assistance to you.   Following are the goals we discussed today:   Goals Addressed             This Visit's Progress    Provide resources and support to optimize health and well-being       Care Coordination Interventions and Goals: Family reports pt with "mild" dementia- able to live alone; however, family concerned about pt getting overly "focused" and fixated on neighbors,etc and causing anxiety- would like to pursue options to support "getting her out of the house some" Reviewed Care Coordination Services:  Assessed Social Determinants of Health Made referral to Care Guide to assist with: resources for adult day care programs and senior center activities/offerings Advised family to  expect email with resources to review and consider- may even want to go tour an Adult Day Care program setting to see if a good fit   SCAT transportation (application to be emailed to granddaughter)  Assessed needs, level of care concerns, how currently meeting needs and barriers to care-lives alone and has family support. Pill packs set up, patient can prepare meals and family assists too. Transportation provided by family as pt does not drive Discuss community support options ( Adult day care, senior centers, Retirement/ALF community, etc) Discussed pay options for personal care needs ( not eligible for Medicaid per family) PACE Program (discussed option for private pay since not Medicaid eligible)  Solution-Focused Strategies employed            Our next appointment is by telephone on 01/30/23 Please call the care guide team at [phone] if you need to cancel or reschedule your appointment.   If you are experiencing a Mental Health or [place] or need someone to talk to, please call the Suicide and Crisis Lifeline: 988 call 911   The patient verbalized  understanding of instructions, educational materials, and care plan provided today and DECLINED offer to receive copy of patient instructions, educational materials, and care plan.   Telephone follow up appointment with care management team member scheduled for:  Eduard Clos, MSW, [place] Worker [facility] [phone]

## 2023-01-19 ENCOUNTER — Telehealth: Payer: Self-pay

## 2023-01-19 NOTE — Telephone Encounter (Signed)
   Telephone encounter was:  Unsuccessful.  01/19/2023 Name: Amanda [name] MRN: [id number] DOB: 13-Jan-1933  Unsuccessful outbound call made today to assist with:  PACE, Adult Day Care, [place] information.  Outreach Attempt:  1st Attempt  A HIPAA compliant voice message was left requesting a return call.  Instructed patient to call back at [phone]. Left voicemail message for patient's granddaughter [name] to return my call regarding resources for PACE, Adult Day Care, Senior Centers and SCAT. Emailed information to patient's granddaughter and daughter as requested.  [place] Resource Care Guide   ??[email or web address]  ?? [email or web address]   Website: [email or web address]  [email or web address]

## 2023-01-22 ENCOUNTER — Encounter: Payer: Self-pay | Admitting: Podiatry

## 2023-01-22 ENCOUNTER — Telehealth: Payer: Self-pay

## 2023-01-22 ENCOUNTER — Ambulatory Visit: Payer: Medicare Other | Admitting: Podiatry

## 2023-01-22 VITALS — BP 147/74

## 2023-01-22 DIAGNOSIS — M79672 Pain in left foot: Secondary | ICD-10-CM

## 2023-01-22 DIAGNOSIS — M79674 Pain in right toe(s): Secondary | ICD-10-CM | POA: Diagnosis not present

## 2023-01-22 DIAGNOSIS — B351 Tinea unguium: Secondary | ICD-10-CM | POA: Diagnosis not present

## 2023-01-22 DIAGNOSIS — L84 Corns and callosities: Secondary | ICD-10-CM | POA: Diagnosis not present

## 2023-01-22 DIAGNOSIS — M79675 Pain in left toe(s): Secondary | ICD-10-CM | POA: Diagnosis not present

## 2023-01-22 DIAGNOSIS — D689 Coagulation defect, unspecified: Secondary | ICD-10-CM | POA: Diagnosis not present

## 2023-01-22 DIAGNOSIS — Q828 Other specified congenital malformations of skin: Secondary | ICD-10-CM | POA: Diagnosis not present

## 2023-01-22 DIAGNOSIS — M79671 Pain in right foot: Secondary | ICD-10-CM | POA: Diagnosis not present

## 2023-01-22 NOTE — Progress Notes (Signed)
  Subjective:  Patient ID: Amanda [name], female    DOB: Oct 08, 1933,  MRN: [id number]  [name] presents to clinic today for {jgcomplaint:23593}  Chief Complaint  Patient presents with   Nail Problem    [facility] PCP-[facility] PCP VST-01/05/2023   New problem(s): None. {jgcomplaint:23593}  PCP is Caren Macadam, MD.  No Known Allergies  Review of Systems: Negative except as noted in the HPI.  Objective: No changes noted in today's physical examination. Vitals:   01/22/23 0826  BP: (!) 147/74   [name] is a pleasant 89 y.o. female {jgbodyhabitus:24098} AAO x 3. Vascular Examination: Palpable pedal pulses b/l LE.  No pedal edema b/l. Skin temperature gradient WNL b/l. No varicosities b/l. CFT <3 seconds b/l LE. Pedal hair absent.  Dermatological Examination: Pedal skin with normal turgor, texture and tone b/l. No open wounds. No interdigital macerations b/l. Toenails 1-5 b/l thickened, discolored, dystrophic with subungual debris. There is pain on palpation to dorsal aspect of nailplates.   Hyperkeratotic lesion(s) distal tip of right 4th toe.  No erythema, no edema, no drainage, no fluctuance.   Porokeratotic lesion(s) plantar heel pad of right foot, submet head 1 right foot, submet head 5 left foot. No erythema, no edema, no drainage, no fluctuance.  Neurological Examination: Protective sensation intact with 10 gram monofilament b/l LE. Vibratory sensation intact b/l LE.   Musculoskeletal Examination: Muscle strength 5/5 to all LE muscle groups b/l. Hammertoe deformity noted 2-5 b/l. Patient ambulates independent of any assistive aids.  Assessment/Plan: 1. Pain due to onychomycosis of toenails of both feet   2. Porokeratosis   3. Callus   4. Pain in both feet     No orders of the defined types were placed in this encounter.   None -Patient/POA to call should there be question/concern in the interim.   Return in about 3 months (around 04/24/2023).  Marzetta Board, DPM

## 2023-01-22 NOTE — Telephone Encounter (Signed)
   Telephone encounter was:  Successful.  01/22/2023 Name: [name] MRN: [id number] DOB: 12-29-1932  Amanda [name] is a 89 y.o. year old female who is a primary care patient of Hagler, Apolonio Schneiders, MD . The community resource team was consulted for assistance with PACE, Adult Day Care, Senior Centers and SCAT.  Care guide performed the following interventions: Spoke with patient's granddaughter [name], verified email addresses forr her and daughter Hadley Pen. Sent information for PACE, Adult Day Care, [place] as requested.    Follow Up Plan:  No further follow up planned at this time. The patient has been provided with needed resources.  [place] Resource Care Guide   ??[email or web address]  ?? [email or web address]   Website: [email or web address]  [email or web address]

## 2023-01-30 ENCOUNTER — Ambulatory Visit: Payer: Self-pay | Admitting: *Deleted

## 2023-01-30 NOTE — Patient Outreach (Signed)
  Care Coordination   Follow Up Visit Note   01/30/2023 Name: Amanda [name] MRN: [id number] DOB: [birth year]  Amanda [name] is a 89 y.o. year old female who sees Caren Macadam, MD for primary care. I spoke with  Amanda [name] by phone today.  What matters to the patients health and wellness today?  Resources received and plan to review.    Goals Addressed             This Visit's Progress    Provide resources and support to optimize health and well-being       Care Coordination Interventions and Goals: Review material mailed with your family Pt with "mild" dementia- able to live alone; however, family concerned about pt getting overly "focused" and fixated on neighbors,etc and causing anxiety- would like to pursue options to support "getting her out of the house some" Reviewed Care Coordination Services:  Assessed Social Determinants of Health Referral to Care Guide completed for resources for adult day care programs and senior center activities/offerings Email sent with resources to review and consider- may even want to go tour an Adult Day Care program setting to see if a good fit  SCAT application emailed to granddaughter) Discuss community support options ( Adult day care, senior centers, Retirement/ALF community, etc) Discussed pay options for personal care needs ( not eligible for Medicaid per family) PACE Program (discussed option for private pay since not Medicaid eligible)  Solution-Focused Strategies employed            SDOH assessments and interventions completed:  Yes     Care Coordination Interventions:  Yes, provided  Interventions Today    Flowsheet Row Most Recent Value  General Interventions   General Interventions Discussed/Reviewed [employer]  [Family has additional family coming to town this weekend and they plan to review resources sent to consider furhter]       Follow up plan: Follow up call scheduled for 02/13/23    Encounter  Outcome:  Pt. Visit Completed

## 2023-01-30 NOTE — Patient Instructions (Signed)
Visit Information  Thank you for taking time to visit with me today. Please don't hesitate to contact me if I can be of assistance to you.   Following are the goals we discussed today:   Goals Addressed             This Visit's Progress    Provide resources and support to optimize health and well-being       Care Coordination Interventions and Goals: Review material mailed with your family Pt with "mild" dementia- able to live alone; however, family concerned about pt getting overly "focused" and fixated on neighbors,etc and causing anxiety- would like to pursue options to support "getting her out of the house some" Reviewed Care Coordination Services:  Assessed Social Determinants of Health Referral to Care Guide completed for resources for adult day care programs and senior center activities/offerings Email sent with resources to review and consider- may even want to go tour an Adult Day Care program setting to see if a good fit  SCAT application emailed to granddaughter) Discuss community support options ( Adult day care, senior centers, Retirement/ALF community, etc) Discussed pay options for personal care needs ( not eligible for Medicaid per family) PACE Program (discussed option for private pay since not Medicaid eligible)  Solution-Focused Strategies employed            Our next appointment is by telephone on 02/13/23    Please call the care guide team at [phone] if you need to cancel or reschedule your appointment.   If you are experiencing a Mental Health or [place] or need someone to talk to, please call the Suicide and Crisis Lifeline: 988 call 911   The patient verbalized understanding of instructions, educational materials, and care plan provided today and DECLINED offer to receive copy of patient instructions, educational materials, and care plan.   Telephone follow up appointment with care management team member scheduled for:  02/13/23  Eduard Clos, MSW, [place] Worker [facility] [phone]

## 2023-02-05 DIAGNOSIS — I10 Essential (primary) hypertension: Secondary | ICD-10-CM | POA: Diagnosis not present

## 2023-02-13 ENCOUNTER — Telehealth: Payer: Self-pay | Admitting: *Deleted

## 2023-02-13 ENCOUNTER — Encounter: Payer: Self-pay | Admitting: *Deleted

## 2023-02-13 NOTE — Patient Outreach (Signed)
  Care Coordination   02/13/2023 Name: Amanda [name] MRN: [id number] DOB: [birth year]   Care Coordination Outreach Attempts:  An unsuccessful telephone outreach was attempted today to offer the patient information about available care coordination services as a benefit of their health plan.   Follow Up Plan:  Additional outreach attempts will be made to offer the patient care coordination information and services.   Encounter Outcome:  No Answer   Care Coordination Interventions:  No, not indicated    Eduard Clos, MSW, [place] Worker [facility] [phone]

## 2023-02-15 ENCOUNTER — Telehealth: Payer: Self-pay | Admitting: *Deleted

## 2023-02-15 NOTE — Progress Notes (Signed)
  Care Coordination Note  02/15/2023 Name: Amanda [name] MRN: [id number] DOB: 1932/12/08  Amanda [name] is a 89 y.o. year old female who is a primary care patient of Hagler, Apolonio Schneiders, MD and is actively engaged with the care management team. I reached out to [name] by phone today to assist with re-scheduling a follow up visit with the Licensed Clinical Social Worker  Follow up plan: Unsuccessful telephone outreach attempt made. A HIPAA compliant phone message was left for the patient providing contact information and requesting a return call.   Julian Hy, [place] Direct Dial: [phone]

## 2023-03-01 NOTE — Progress Notes (Signed)
  Care Coordination Note  03/01/2023 Name: [name] MRN: [id number] DOB: 1933-04-05  Amanda [name] is a 89 y.o. year old female who is a primary care patient of Hagler, Fleet Contras, MD and is actively engaged with the care management team. I reached out to Vance Peper by phone today to assist with re-scheduling a follow up visit with the Licensed Clinical Social Worker  Follow up plan: We have been unable to make contact with the patient for follow up.   Burman Nieves, CCMA Care Coordination Care Guide Direct Dial: [phone]

## 2023-03-29 ENCOUNTER — Ambulatory Visit: Payer: Medicare Other | Admitting: Internal Medicine

## 2023-03-29 ENCOUNTER — Encounter: Payer: Self-pay | Admitting: Internal Medicine

## 2023-03-29 ENCOUNTER — Telehealth: Payer: Self-pay

## 2023-03-29 VITALS — BP 132/72

## 2023-03-29 DIAGNOSIS — J432 Centrilobular emphysema: Secondary | ICD-10-CM | POA: Diagnosis not present

## 2023-03-29 DIAGNOSIS — I2699 Other pulmonary embolism without acute cor pulmonale: Secondary | ICD-10-CM

## 2023-03-29 MED ORDER — REVEFENACIN 175 MCG/3ML IN SOLN: 175.0000 ug | Freq: Every day | RESPIRATORY_TRACT | 5 refills | Status: DC

## 2023-03-29 MED ORDER — ARFORMOTEROL TARTRATE 15 MCG/2ML IN NEBU: 15.0000 ug | INHALATION_SOLUTION | Freq: Two times a day (BID) | RESPIRATORY_TRACT | 6 refills | Status: DC

## 2023-03-29 NOTE — Progress Notes (Signed)
Amanda [name]    [id number]    1933/10/05  Primary Care Physician:Hagler, Fleet Contras, MD Date of Appointment: 03/29/2023 Established Patient Visit  Chief complaint:   Chief Complaint  Patient presents with   Acute Visit    Dizzy, SOB, lightheaded, coughing and wheezing     HPI: [name] is a 89 y.o. woman with shortness of breath and history of tobacco use with centrilobular emphysema.  Interval Updates: Here for follow up with since 2022. Here with her granddaughter today.  She is here for acute visit for shortness of breath. She is still taking breathing treatments about twice a day, but symptoms don't abate for long - she feels   Oxygen levels have been dropping at home per family she has been in the 15s with exertion and being dizzy and lightheaded.   In  January 2024 she was hospitalized for acute subsegmental pulmonary embolism, felt to be unprovoked. She had a history of DVT prior to this and xarelto was resumed at discharge. Recommended lifelong anticoagulation. Vitals were stable at that time without hypoxemia.   She has been adherent to xarelto. No falls.   I have reviewed the patient's family social and past medical history and updated as appropriate.   Past Medical History:  Diagnosis Date   Depression    DVT (deep venous thrombosis) (HCC)    Hyperlipidemia    Hypertension    Memory disorder    Osteoporosis    PVC (premature ventricular contraction)     Past Surgical History:  Procedure Laterality Date   PARTIAL HYSTERECTOMY      Family History  Problem Relation Age of Onset   Alzheimer's disease Mother    Lung cancer Neg Hx    Memory loss Neg Hx     Social History   Occupational History   Not on file  Tobacco Use   Smoking status: Former    Years: 17    Types: Cigarettes    Start date: 10/29/1954    Quit date: 10/30/1971    Years since quitting: 51.4   Smokeless tobacco: Never   Tobacco comments:    smoked 3-4/day  Substance  and Sexual Activity   Alcohol use: No   Drug use: No   Sexual activity: Not on file     Physical Exam: Blood pressure 132/72, SpO2 91 %.  Gen:     Nad, kyphosis Lungs:    ctab no wheezes or crackles CV:         RRR no mrg   Data Reviewed: Imaging: I have personally reviewed the CT Chest Nov 2021 with centrilobular emphysema  PFTs:    Latest Ref Rng & Units 12/17/2020    9:59 AM  PFT Results  FVC-Pre L 1.40   FVC-Predicted Pre % 93   FVC-Post L 1.43   FVC-Predicted Post % 95   Pre FEV1/FVC % % 84   Post FEV1/FCV % % 83   FEV1-Pre L 1.18   FEV1-Predicted Pre % 102   FEV1-Post L 1.19   DLCO uncorrected ml/min/mmHg 10.99   DLCO UNC% % 65   DLCO corrected ml/min/mmHg 10.99   DLCO COR %Predicted % 65   DLVA Predicted % 105   TLC L 3.50   TLC % Predicted % 75   RV % Predicted % 80    I have personally reviewed the patient's PFTs and they show no airflow limitation without a BD response.   Echocardiogram Left Ventricle:  Left ventricular ejection fraction, by estimation, is 50  to 55%. The left ventricle has low normal function. The left ventricle has  no regional wall motion abnormalities. The left ventricular internal  cavity size was normal in size.  There is no left ventricular hypertrophy. Left ventricular diastolic  parameters are consistent with Grade I diastolic dysfunction (impaired  relaxation).   Right Ventricle: The right ventricular size is normal. No increase in  right ventricular wall thickness. Right ventricular systolic function is  normal. There is normal pulmonary artery systolic pressure. The tricuspid  regurgitant velocity is 2.75 m/s, and   with an assumed right atrial pressure of 3 mmHg, the estimated right  ventricular systolic pressure is 33.2 mmHg.   Left Atrium: Left atrial size was normal in size.   Right Atrium: Right atrial size was normal in size.   Pericardium: There is no evidence of pericardial effusion.   Mitral Valve: The  mitral valve is normal in structure. No evidence of  mitral valve regurgitation. No evidence of mitral valve stenosis.   Tricuspid Valve: The tricuspid valve is normal in structure. Tricuspid  valve regurgitation is moderate . No evidence of tricuspid stenosis.   Aortic Valve: The aortic valve is normal in structure. Aortic valve  regurgitation is mild. Aortic regurgitation PHT measures 1063 msec. Mild  aortic valve sclerosis is present, with no evidence of aortic valve  stenosis.   Pulmonic Valve: The pulmonic valve was normal in structure. Pulmonic valve  regurgitation is not visualized. No evidence of pulmonic stenosis.   Aorta: The aortic root is normal in size and structure.   Venous: The inferior vena cava is normal in size with greater than 50%  respiratory variability, suggesting right atrial pressure of 3 mmHg.   IAS/Shunts: No atrial level shunt detected by color flow Doppler.   Labs: Lab Results  Component Value Date   WBC 4.1 12/02/2022   HGB 12.9 12/02/2022   HCT 39.6 12/02/2022   MCV 93.0 12/02/2022   PLT 250 12/02/2022   Lab Results  Component Value Date   NA 137 12/02/2022   K 3.9 12/02/2022   CL 102 12/02/2022   CO2 25 12/02/2022     Immunization status: Immunization History  Administered Date(s) Administered   Influenza, High Dose Seasonal PF 09/03/2015, 08/22/2016, 10/16/2017, 11/26/2018, 08/01/2019, 09/10/2020, 08/24/2021   Influenza,inj,Quad PF,6+ Mos 01/06/2015   PFIZER(Purple Top)SARS-COV-2 Vaccination 12/17/2019, 01/07/2020, 09/13/2020   Pneumococcal Conjugate-13 10/01/2015, 08/22/2016   Pneumococcal Polysaccharide-23 11/26/2018   Tdap 10/25/2011   Zoster, Live 12/03/2020, 02/24/2021    Assessment:  Centrilobular emphysema with worsening shortness of breath Unprovoked pulmonary embolism  Plan/Recommendations:  Will prescribe maintenance yupelri and brovana nebulizers through [facility].   She did not qualify for home oxygen based on  ambulatory desat study but we discussed having her come back for a walk test to see if she exerts enough to desaturate.  Continue prn albuterol. She is not able to use inhalers.   Continue xarelto lifelong given unprovoked recurrent nature of dvt.    Return to Care: Return in about 3 months (around 06/29/2023).   Durel Salts, MD Pulmonary and Critical Care Medicine [facility] Office:[phone]

## 2023-03-29 NOTE — Telephone Encounter (Signed)
Granddaughter would like pt to be walked long to see if O2 sats would drop with further ambulation. Pt was placed on 6 min walk schedule to complete qualification walk.

## 2023-03-29 NOTE — Patient Instructions (Addendum)
Please schedule follow up scheduled with myself in 3 months.  If my schedule is not open yet, we will contact you with a reminder closer to that time. Please call [phone] if you haven't heard from [place] a month before.   Will prescribe maintenance yupelri and brovana nebulizers through [facility]. They will mail these to you.   Continue prn albuterol.   Continue xarelto

## 2023-03-30 ENCOUNTER — Telehealth: Payer: Self-pay | Admitting: Internal Medicine

## 2023-03-30 DIAGNOSIS — J432 Centrilobular emphysema: Secondary | ICD-10-CM | POA: Diagnosis not present

## 2023-03-30 NOTE — Telephone Encounter (Signed)
Marquita granddaughter would like to know if Rosalyn Gess and Guinevere Ferrari can be sent to [facility]. Marquita phone number is [phone].

## 2023-04-02 ENCOUNTER — Other Ambulatory Visit: Payer: Self-pay

## 2023-04-02 DIAGNOSIS — J432 Centrilobular emphysema: Secondary | ICD-10-CM

## 2023-04-02 MED ORDER — REVEFENACIN 175 MCG/3ML IN SOLN: 175.0000 ug | Freq: Every day | RESPIRATORY_TRACT | 5 refills | Status: DC

## 2023-04-02 MED ORDER — ARFORMOTEROL TARTRATE 15 MCG/2ML IN NEBU: 15.0000 ug | INHALATION_SOLUTION | Freq: Two times a day (BID) | RESPIRATORY_TRACT | 6 refills | Status: AC

## 2023-04-02 NOTE — Telephone Encounter (Signed)
Called and spoke w/ pt granddaughter - let her know I have sent in the medications requested to preferred pharmacy. NFN att.

## 2023-04-04 ENCOUNTER — Telehealth: Payer: Self-pay | Admitting: Internal Medicine

## 2023-04-04 NOTE — Telephone Encounter (Signed)
Clinicals cut off when we fax'd them to [facility] and she needs Ins info. As well  Fax # [phone]

## 2023-04-04 NOTE — Telephone Encounter (Signed)
Insurance info and OV notes faxed to [facility]. NFN

## 2023-04-05 ENCOUNTER — Ambulatory Visit: Payer: Medicare Other

## 2023-04-09 ENCOUNTER — Ambulatory Visit (INDEPENDENT_AMBULATORY_CARE_PROVIDER_SITE_OTHER): Payer: Medicare Other | Admitting: Internal Medicine

## 2023-04-09 DIAGNOSIS — J432 Centrilobular emphysema: Secondary | ICD-10-CM

## 2023-04-09 NOTE — Progress Notes (Unsigned)
Six Minute Walk - 04/09/23 0957       Six Minute Walk   Medications taken before test (dose and time) None    Supplemental oxygen during test? No    Lap distance in meters  34 meters    Laps Completed 10    Baseline BP (sitting) 138/76    Baseline Heartrate 55    Baseline Dyspnea (Borg Scale) 1    Baseline Fatigue (Borg Scale) 1    Baseline SPO2 97 %      End of Test Values    BP (sitting) 140/78    Heartrate 65    Dyspnea (Borg Scale) 5    Fatigue (Borg Scale) 5    SPO2 93 %      2 Minutes Post Walk Values   Stopped or paused before six minutes? Yes    Reason: Pt had to sit after working for aprox 4 mins for 1.5 mins. O2 sat at time was 95%, pt stated legs felt wobbley. Pt completed walk after rest      Interpretation   Tech Comments: Lowest pt sat was 93% on RA

## 2023-04-12 ENCOUNTER — Telehealth: Payer: Self-pay | Admitting: Internal Medicine

## 2023-04-12 NOTE — Telephone Encounter (Signed)
Amanda [name] daughter states Mikael Spray is too expensive. Pharmacy is [facility]. Amanda [name] phone number is [phone] and [phone] until 3:30 pm.

## 2023-04-12 NOTE — Telephone Encounter (Signed)
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

## 2023-04-13 NOTE — Telephone Encounter (Signed)
Please run ticket on patients insurance to see what else is covered?  Please route back to triage

## 2023-04-20 ENCOUNTER — Other Ambulatory Visit (HOSPITAL_COMMUNITY): Payer: Self-pay

## 2023-04-20 NOTE — Telephone Encounter (Signed)
Medication is covered per test claim under Part C. Typically nebulizer solutions are also covered under Part B.  Co-pay is $274.75

## 2023-05-15 ENCOUNTER — Telehealth: Payer: Self-pay | Admitting: *Deleted

## 2023-05-15 NOTE — Patient Outreach (Signed)
  Care Coordination   05/15/2023 Name: Amanda [name] MRN: [id number] DOB: 06/07/1933   Care Coordination Outreach Attempts:  A second unsuccessful outreach was attempted today to offer the patient with information about available care coordination services.  Follow Up Plan:  Additional outreach attempts will be made to offer the patient care coordination information and services.   Encounter Outcome:  No Answer   Care Coordination Interventions:  No, not indicated    Reece Levy, MSW, LCSW Clinical Social Worker [facility] [phone]

## 2023-05-22 ENCOUNTER — Ambulatory Visit: Payer: Medicare Other | Admitting: Podiatry

## 2023-05-22 DIAGNOSIS — M79675 Pain in left toe(s): Secondary | ICD-10-CM | POA: Diagnosis not present

## 2023-05-22 DIAGNOSIS — Q828 Other specified congenital malformations of skin: Secondary | ICD-10-CM

## 2023-05-22 DIAGNOSIS — M79674 Pain in right toe(s): Secondary | ICD-10-CM | POA: Diagnosis not present

## 2023-05-22 DIAGNOSIS — L84 Corns and callosities: Secondary | ICD-10-CM | POA: Diagnosis not present

## 2023-05-22 DIAGNOSIS — B351 Tinea unguium: Secondary | ICD-10-CM

## 2023-05-22 DIAGNOSIS — D689 Coagulation defect, unspecified: Secondary | ICD-10-CM

## 2023-05-22 NOTE — Progress Notes (Signed)
  Subjective:  Patient ID: Vance Peper, female    DOB: Apr 05, 1933,  MRN: [id number]  Dorthie Houseknecht presents to clinic today for: at risk foot care with h/o clotting disorder and painful porokeratotic lesion(s) both feet and painful mycotic toenails that limit ambulation. Painful toenails interfere with ambulation. Aggravating factors include wearing enclosed shoe gear. Pain is relieved with periodic professional debridement. Painful porokeratotic lesions are aggravated when weightbearing with and without shoegear. Pain is relieved with periodic professional debridement.   Her granddaughter is present during today's visit. Chief Complaint  Patient presents with   NAIL CARE    RFC    PCP is Aliene Beams, MD.  No Known Allergies  Review of Systems: Negative except as noted in the HPI.  Objective: No changes noted in today's physical examination. There were no vitals filed for this visit.  Kirbie Shimon is a pleasant 89 y.o. female in NAD. AAO x 3.  Vascular Examination: Capillary refill time <3 seconds b/l LE. Palpable pedal pulses b/l LE. Digital hair present b/l. No pedal edema b/l. Skin temperature gradient WNL b/l. No varicosities b/l. No ischemia or gangrene noted b/l LE. No cyanosis or clubbing noted b/l LE.Marland Kitchen  Dermatological Examination: Pedal skin with normal turgor, texture and tone b/l. No open wounds. No interdigital macerations b/l. Toenails 1-5 b/l thickened, discolored, dystrophic with subungual debris. There is pain on palpation to dorsal aspect of nailplates. Hyperkeratotic lesion(s) R 4th toe.  No erythema, no edema, no drainage, no fluctuance. Porokeratotic lesion(s) right heel, submet head 1 right foot, and submet head 5 left foot. No erythema, no edema, no drainage, no fluctuance..  Neurological Examination: Protective sensation intact with 10 gram monofilament b/l LE. Vibratory sensation intact b/l LE.   Musculoskeletal Examination: Normal muscle strength 5/5 to  all lower extremity muscle groups bilaterally. Hammertoe(s) noted to the 2-5 bilaterally.. No pain, crepitus or joint limitation noted with ROM b/l LE.  Patient ambulates independently without assistive aids.  Assessment/Plan: 1. Pain due to onychomycosis of toenails of both feet   2. Porokeratosis   3. Clotting disorder (HCC)    -Consent given for treatment as described below: -Examined patient. -Continue supportive shoe gear daily. -Toenails 1-5 b/l were debrided in length and girth with sterile nail nippers and dremel without iatrogenic bleeding.  -Corn(s) R 4th toe pared utilizing sterile scalpel blade without complication or incident. Total number debrided=1. -Porokeratotic lesion(s) right heel, submet head 1 right foot, and submet head 5 left foot pared and enucleated with sterile currette without incident. Total number of lesions debrided=3. -Patient/POA to call should there be question/concern in the interim.   Return in about 3 months (around 08/22/2023).  Freddie Breech, DPM

## 2023-05-28 ENCOUNTER — Encounter: Payer: Self-pay | Admitting: Podiatry

## 2023-05-29 ENCOUNTER — Telehealth: Payer: Self-pay | Admitting: *Deleted

## 2023-05-29 NOTE — Patient Outreach (Signed)
  Care Coordination   Follow Up Visit Note   05/29/2023 Name: Amanda [name] MRN: [id number] DOB: 10/28/1933  Amanda [name] is a 89 y.o. year old female who sees Aliene Beams, MD for primary care. I  spoke with pt's daughter Amanda [name]  What matters to the patients health and wellness today?  Daughter coming to visit and wants to have phone call with CSW and pt on that day.    Goals Addressed   None     SDOH assessments and interventions completed:  Yes     Care Coordination Interventions:  Yes, provided  Interventions Today    Flowsheet Row Most Recent Value  General Interventions   General Interventions Discussed/Reviewed [employer], [place], will be coming to visit pt and wants to plan a phone visit with CSW for 05/1923]       Follow up plan: Follow up call scheduled for 06/08/23    Encounter Outcome:  Pt. Visit Completed

## 2023-06-08 ENCOUNTER — Ambulatory Visit: Payer: Self-pay | Admitting: *Deleted

## 2023-06-08 NOTE — Patient Outreach (Signed)
  Care Coordination   Follow Up Visit Note   06/08/2023 Name: Amanda [name] MRN: [id number] DOB: 04-26-1933  Amanda [name] is a 89 y.o. year old female who sees Aliene Beams, MD for primary care. I spoke with  Vance Peper by phone today. Daughter, Donnel Saxon, also on phone call as she is here visiting mom  What matters to the patients health and wellness today?  Getting some help in home to improve pt's safety, activity and overall wellness.     Goals Addressed             This Visit's Progress    Provide resources and support to optimize health and well-being       Care Coordination Interventions and Goals: Review material mailed with your family- also sending email with private duty care agencies to vet Pt with "mild" dementia- able to live alone; however, family concerned about pt getting overly "focused" and fixated on neighbors,etc and causing anxiety- would like to pursue options to support "getting her out of the house some" Reviewed Care Coordination Services:  Assessed Social Determinants of Health Referral to Care Guide completed for resources for adult day care programs and senior center activities/offerings Previous email sent with resources to review and consider- may even want to go tour an Adult Day Care program setting to see if a good fit  SCAT application emailed to granddaughter) Discuss community support options ( Adult day care, senior centers, Retirement/ALF community, etc) Discussed pay options for personal care needs ( not eligible for Medicaid per family) PACE Program (discussed option for private pay since not Medicaid eligible)  Solution-Focused Strategies employed            SDOH assessments and interventions completed:  Yes     Care Coordination Interventions:  Yes, provided  Interventions Today    Flowsheet Row Most Recent Value  Chronic Disease   Chronic disease during today's visit Other  [mild dementia]  General Interventions   General  Interventions Discussed/Reviewed General Interventions Discussed, Community Resources, Level of Care  Level of Care Adult Daycare, Applications  [Family wants to pursue private duty caregiver to work with pt on exercise (walking more to increase endurance/balance/safety) for upcoming family trip as well as to take her to the [facility] to get more engaged and active-]  Applications Other  [CSW will email list of private duty agencies to daughter, Alfreda]  Exercise Interventions   Exercise Discussed/Reviewed Exercise Discussed, Physical Activity  [Daughter, Alfreda, is here visiting and found her mom to not be able to walk far- wants to hire someone to work with  her on increasing and improving her mobility as well as someone to go with her to [facility]. Family plans to split the cost]  Physical Activity Discussed/Reviewed Physical Activity Discussed, Types of exercise  Education Interventions   Education Provided Provided Education  Provided Verbal Education On [employer]  [Discussed community programs, resources and will email list of pvt duty care agencies for family to investigate and select]  Applications Other  [CSW will email list of private duty agencies to daughter, Alfreda]  Safety Interventions   Safety Discussed/Reviewed Safety Discussed       Follow up plan: Follow up call scheduled for 06/15/23    Encounter Outcome:  Pt. Visit Completed

## 2023-06-08 NOTE — Patient Instructions (Signed)
Visit Information  Thank you for taking time to visit with me today. Please don't hesitate to contact me if I can be of assistance to you.   Following are the goals we discussed today:   Goals Addressed             This Visit's Progress    Provide resources and support to optimize health and well-being       Care Coordination Interventions and Goals: Review material mailed with your family- also sending email with private duty care agencies to vet Pt with "mild" dementia- able to live alone; however, family concerned about pt getting overly "focused" and fixated on neighbors,etc and causing anxiety- would like to pursue options to support "getting her out of the house some" Reviewed Care Coordination Services:  Assessed Social Determinants of Health Referral to Care Guide completed for resources for adult day care programs and senior center activities/offerings Previous email sent with resources to review and consider- may even want to go tour an Adult Day Care program setting to see if a good fit  SCAT application emailed to granddaughter) Discuss community support options ( Adult day care, senior centers, Retirement/ALF community, etc) Discussed pay options for personal care needs ( not eligible for Medicaid per family) PACE Program (discussed option for private pay since not Medicaid eligible)  Solution-Focused Strategies employed            Our next appointment is by telephone on 06/15/23  Please call the care guide team at [phone] if you need to cancel or reschedule your appointment.   If you are experiencing a Mental Health or Behavioral Health Crisis or need someone to talk to, please call the Suicide and Crisis Lifeline: 988 call 911   The patient verbalized understanding of instructions, educational materials, and care plan provided today and DECLINED offer to receive copy of patient instructions, educational materials, and care plan.   Telephone follow up  appointment with care management team member scheduled for:06/15/23  Reece Levy, MSW, LCSW Clinical Social Worker [facility] [phone]

## 2023-06-15 ENCOUNTER — Encounter: Payer: Self-pay | Admitting: *Deleted

## 2023-06-21 DIAGNOSIS — D509 Iron deficiency anemia, unspecified: Secondary | ICD-10-CM | POA: Diagnosis not present

## 2023-06-21 DIAGNOSIS — R300 Dysuria: Secondary | ICD-10-CM | POA: Diagnosis not present

## 2023-06-21 DIAGNOSIS — R531 Weakness: Secondary | ICD-10-CM | POA: Diagnosis not present

## 2023-06-26 IMAGING — CR DG FOOT COMPLETE 3+V*R*
3 series · 3 of 3 positions shown · non-contrast
Comparison: None.

CLINICAL DATA: Pain, injury yesterday.  Struck foot against a door.

EXAM:
RIGHT FOOT COMPLETE - 3+ VIEW

[foot ap]
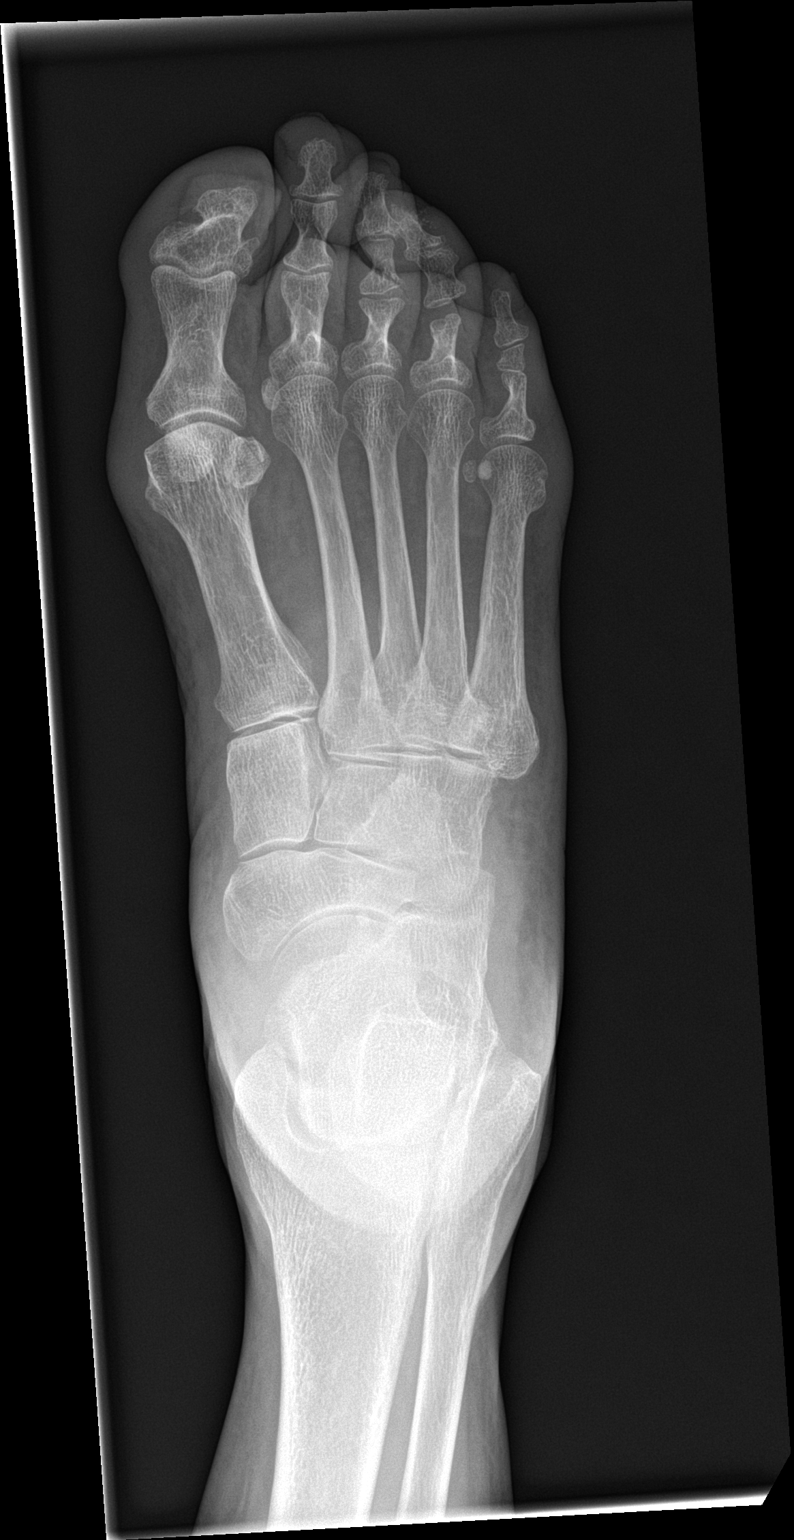

[foot obl]
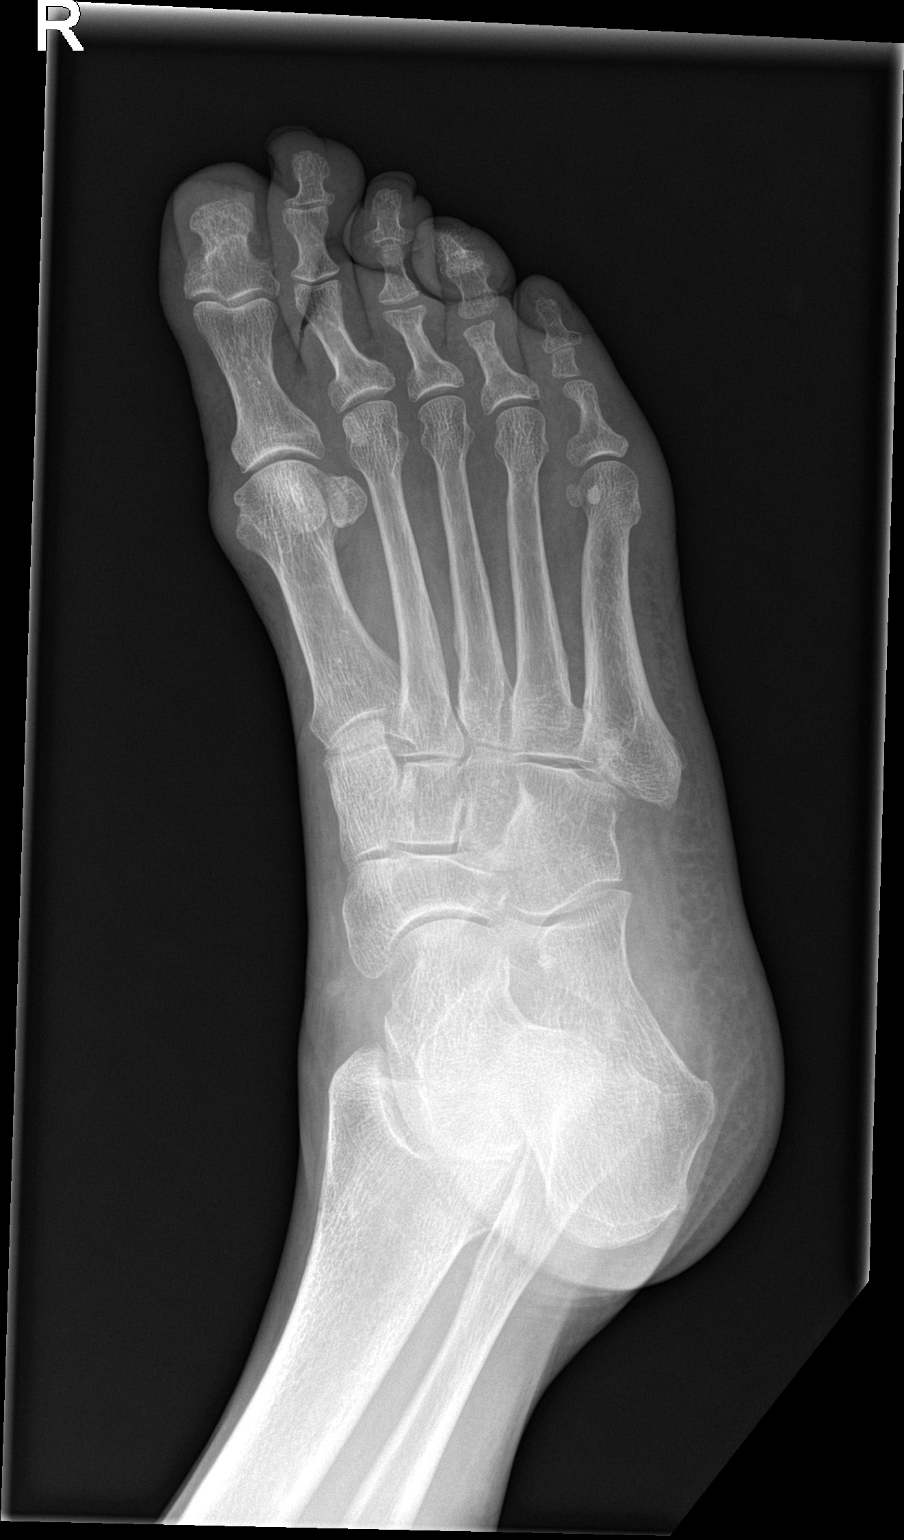

[foot lat]
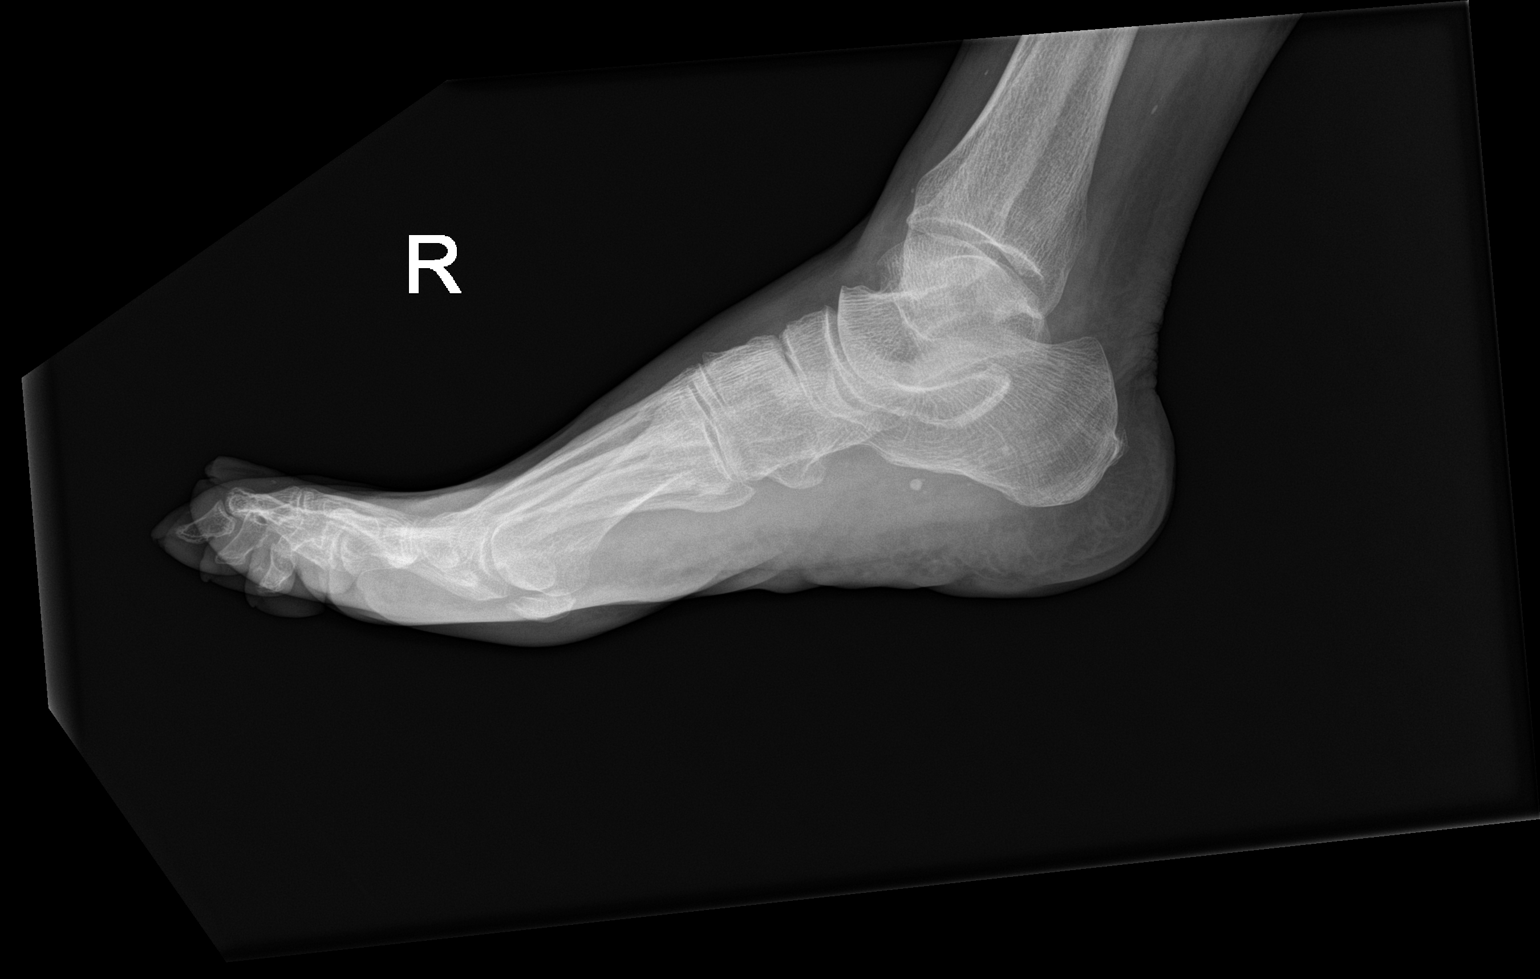

[3 of 3 positions shown; findings below may reference images not displayed]

FINDINGS: There is no evidence of fracture or dislocation. Scattered
osteoarthritis with spurring and joint space narrowing. No erosion.
Small Achilles tendon enthesophyte.
IMPRESSION: No fracture or subluxation of the right foot. Scattered
osteoarthritis.

## 2023-06-26 IMAGING — CR DG ANKLE COMPLETE 3+V*R*
3 series · 3 of 3 positions shown · non-contrast
Comparison: None.

CLINICAL DATA: Right leg pain following right leg injury

EXAM:
RIGHT ANKLE - COMPLETE 3+ VIEW

[ankle ap]
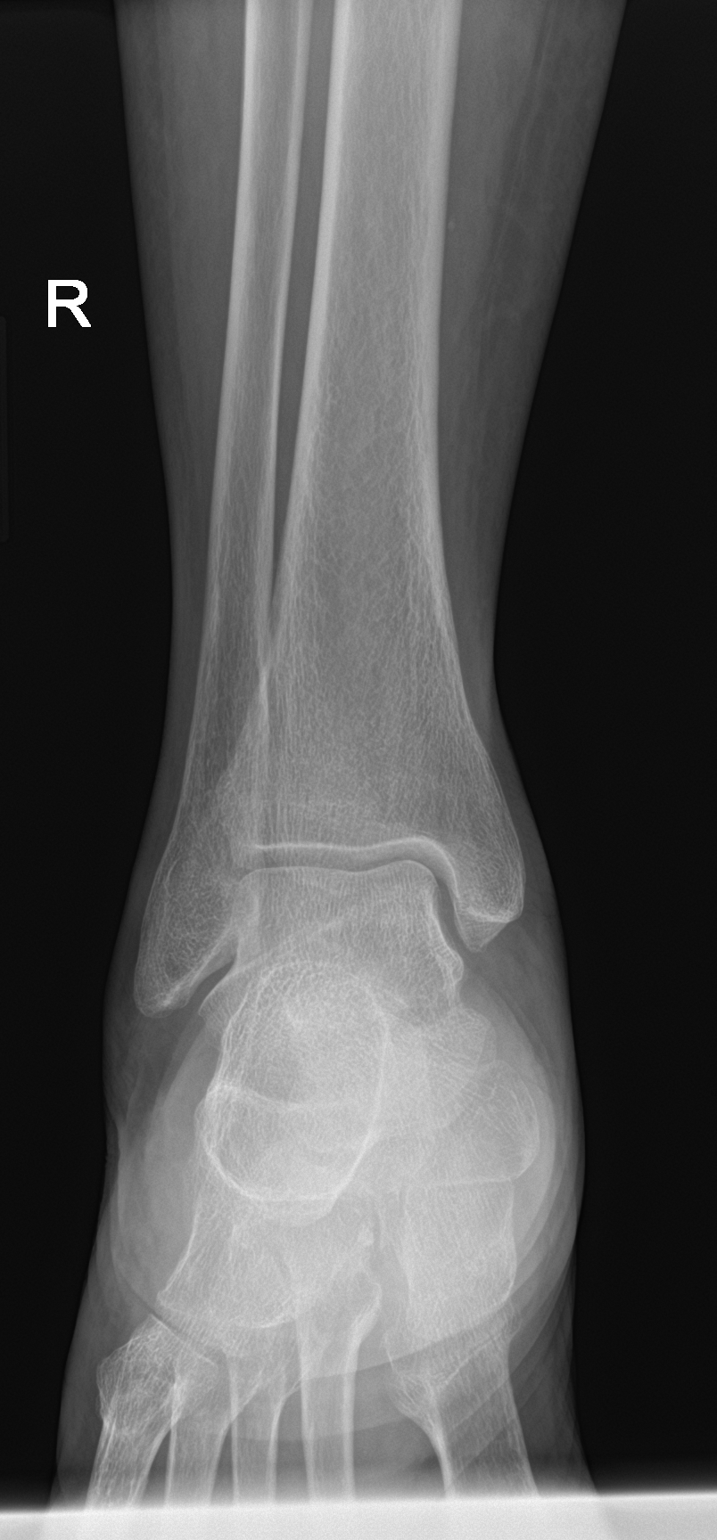

[ankle obl]
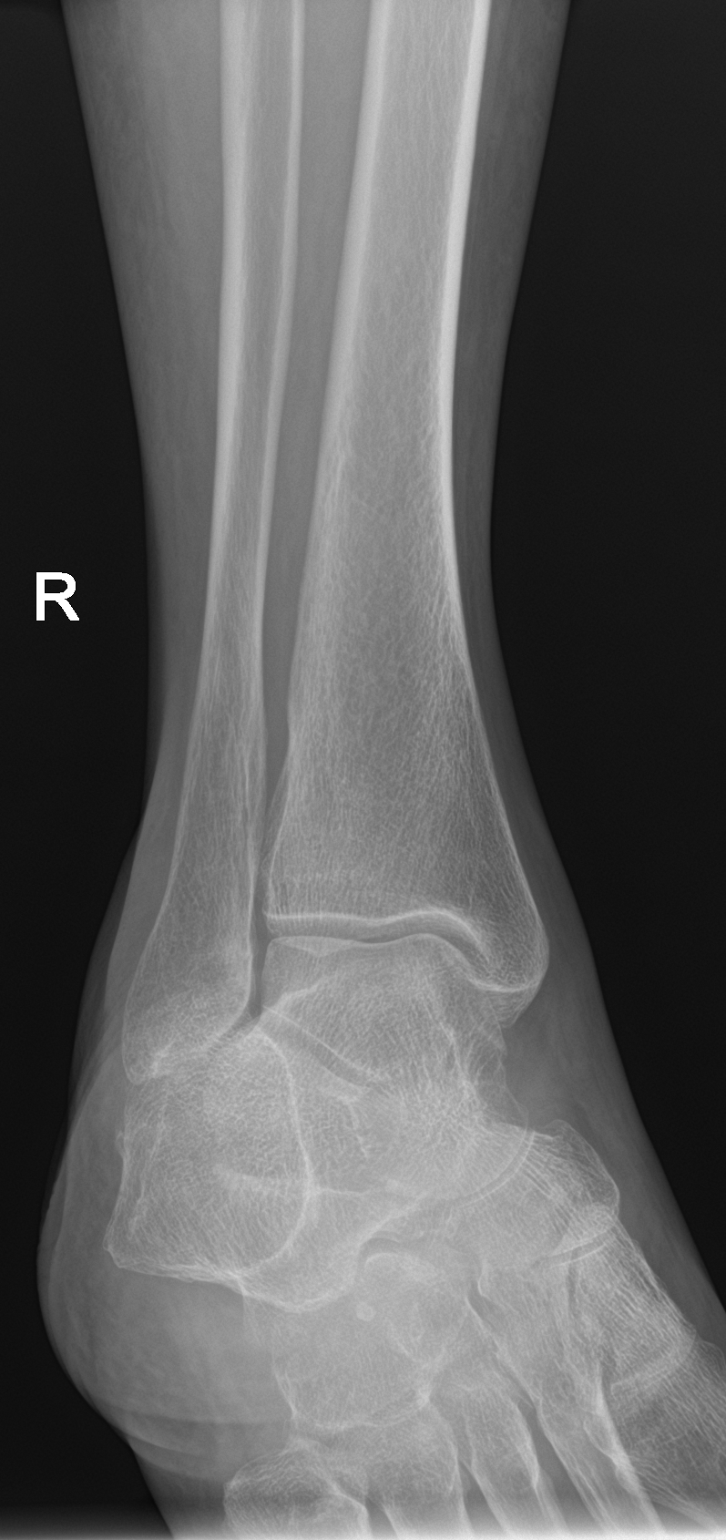

[ankle lat]
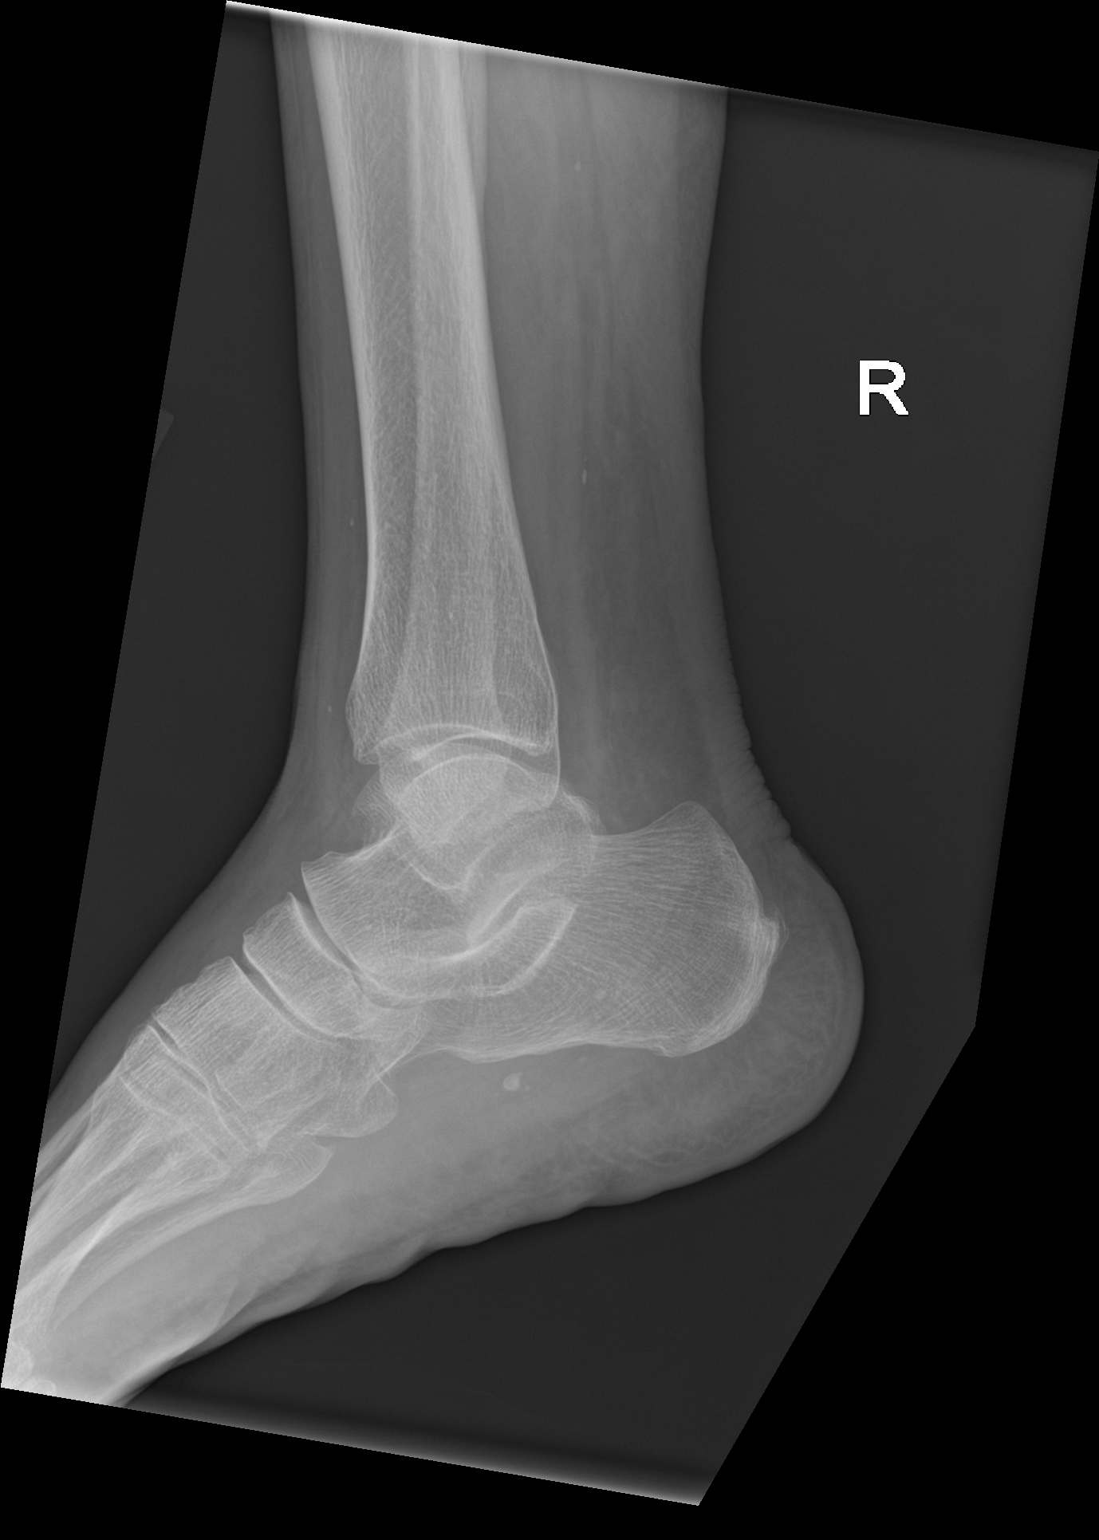

[3 of 3 positions shown; findings below may reference images not displayed]

FINDINGS: Imaging is slightly limited by obliquity on lateral examination,
however, there is normal alignment. No definite fracture or
dislocation. Ankle mortise appears aligned. No ankle effusion. Mild
soft tissue swelling is noted dorsal to the midfoot.
IMPRESSION: Mild soft tissue swelling dorsal to the midfoot. No acute fracture
or dislocation.

## 2023-06-28 ENCOUNTER — Ambulatory Visit: Payer: Medicare Other | Admitting: Adult Health

## 2023-06-29 ENCOUNTER — Encounter: Payer: Self-pay | Admitting: *Deleted

## 2023-06-29 ENCOUNTER — Telehealth: Payer: Self-pay | Admitting: *Deleted

## 2023-06-29 NOTE — Patient Outreach (Signed)
  Care Coordination   06/29/2023 Name: Amanda [name] MRN: [id number] DOB: 12-22-1932   Care Coordination Outreach Attempts:  An unsuccessful telephone outreach was attempted today to offer the patient information about available care coordination services.  Follow Up Plan:  Additional outreach attempts will be made to offer the patient care coordination information and services.   Encounter Outcome:  No Answer   Care Coordination Interventions:  No, not indicated    Reece Levy, MSW, LCSW Clinical Social Worker [facility] [phone]

## 2023-07-02 DIAGNOSIS — D649 Anemia, unspecified: Secondary | ICD-10-CM | POA: Diagnosis not present

## 2023-07-02 DIAGNOSIS — Z1211 Encounter for screening for malignant neoplasm of colon: Secondary | ICD-10-CM | POA: Diagnosis not present

## 2023-07-06 ENCOUNTER — Telehealth: Payer: Self-pay | Admitting: *Deleted

## 2023-07-06 NOTE — Progress Notes (Signed)
  Care Coordination Note  07/06/2023 Name: Tala Sawyers MRN: [id number] DOB: Nov 15, 1933  Amanda [name] is a 89 y.o. year old female who is a primary care patient of Hagler, Fleet Contras, MD and is actively engaged with the care management team. I reached out to Vance Peper by phone today to assist with re-scheduling a follow up visit with the Licensed Clinical Social Worker  Follow up plan: Spoke with Alfreda who declines to reschedule at this time - says she will call back   Burman Nieves, [facility] Care Coordination Care Guide Direct Dial: [phone]

## 2023-08-16 ENCOUNTER — Encounter (HOSPITAL_BASED_OUTPATIENT_CLINIC_OR_DEPARTMENT_OTHER): Payer: Self-pay | Admitting: Emergency Medicine

## 2023-08-16 ENCOUNTER — Emergency Department (HOSPITAL_BASED_OUTPATIENT_CLINIC_OR_DEPARTMENT_OTHER): Payer: Medicare Other

## 2023-08-16 ENCOUNTER — Emergency Department (HOSPITAL_BASED_OUTPATIENT_CLINIC_OR_DEPARTMENT_OTHER): Payer: Medicare Other | Admitting: Radiology

## 2023-08-16 ENCOUNTER — Other Ambulatory Visit: Payer: Self-pay

## 2023-08-16 ENCOUNTER — Emergency Department (HOSPITAL_BASED_OUTPATIENT_CLINIC_OR_DEPARTMENT_OTHER)
Admission: EM | Admit: 2023-08-16 | Discharge: 2023-08-17 | Disposition: A | Discharge status: Home / Self Care | Payer: Medicare Other | Attending: Emergency Medicine | Admitting: Emergency Medicine

## 2023-08-16 DIAGNOSIS — M1811 Unilateral primary osteoarthritis of first carpometacarpal joint, right hand: Secondary | ICD-10-CM | POA: Diagnosis not present

## 2023-08-16 DIAGNOSIS — R4182 Altered mental status, unspecified: Secondary | ICD-10-CM | POA: Diagnosis not present

## 2023-08-16 DIAGNOSIS — J439 Emphysema, unspecified: Secondary | ICD-10-CM | POA: Diagnosis not present

## 2023-08-16 DIAGNOSIS — Z043 Encounter for examination and observation following other accident: Secondary | ICD-10-CM | POA: Diagnosis not present

## 2023-08-16 DIAGNOSIS — F0390 Unspecified dementia without behavioral disturbance: Secondary | ICD-10-CM | POA: Insufficient documentation

## 2023-08-16 DIAGNOSIS — R202 Paresthesia of skin: Secondary | ICD-10-CM | POA: Diagnosis not present

## 2023-08-16 DIAGNOSIS — Z7982 Long term (current) use of aspirin: Secondary | ICD-10-CM | POA: Insufficient documentation

## 2023-08-16 DIAGNOSIS — R600 Localized edema: Secondary | ICD-10-CM | POA: Insufficient documentation

## 2023-08-16 DIAGNOSIS — M19011 Primary osteoarthritis, right shoulder: Secondary | ICD-10-CM | POA: Diagnosis not present

## 2023-08-16 DIAGNOSIS — M7989 Other specified soft tissue disorders: Secondary | ICD-10-CM | POA: Diagnosis not present

## 2023-08-16 DIAGNOSIS — M47812 Spondylosis without myelopathy or radiculopathy, cervical region: Secondary | ICD-10-CM | POA: Diagnosis not present

## 2023-08-16 DIAGNOSIS — M85821 Other specified disorders of bone density and structure, right upper arm: Secondary | ICD-10-CM | POA: Diagnosis not present

## 2023-08-16 DIAGNOSIS — Z7901 Long term (current) use of anticoagulants: Secondary | ICD-10-CM | POA: Diagnosis not present

## 2023-08-16 DIAGNOSIS — Z86711 Personal history of pulmonary embolism: Secondary | ICD-10-CM | POA: Insufficient documentation

## 2023-08-16 DIAGNOSIS — M5030 Other cervical disc degeneration, unspecified cervical region: Secondary | ICD-10-CM | POA: Diagnosis not present

## 2023-08-16 DIAGNOSIS — M79601 Pain in right arm: Secondary | ICD-10-CM | POA: Diagnosis not present

## 2023-08-16 DIAGNOSIS — W19XXXA Unspecified fall, initial encounter: Secondary | ICD-10-CM | POA: Diagnosis not present

## 2023-08-16 DIAGNOSIS — I6523 Occlusion and stenosis of bilateral carotid arteries: Secondary | ICD-10-CM | POA: Diagnosis not present

## 2023-08-16 DIAGNOSIS — I517 Cardiomegaly: Secondary | ICD-10-CM | POA: Diagnosis not present

## 2023-08-16 NOTE — Discharge Instructions (Signed)
Please follow-up with your primary care provider in regards to his symptoms and ER visit.  Today your imaging was reassuring including your hand, arm, shoulder, head, neck.  You do have arthritis in your right shoulder that is severe and will need to talk to your primary care provider about a possible orthopedic follow-up.  Your swelling may be due to inflammation as you are on Xarelto and will swell very easily.  Ultrasound did not find a blood clot today.  You may take Tylenol every 6 hours as needed for pain.  Ice your hand and follow-up with your primary care provider.  If symptoms worsen please return to ER.

## 2023-08-16 NOTE — ED Provider Notes (Signed)
[place] [facility] AT [facility] Provider Note   CSN: [id number] Arrival date & time: 08/16/23  1831     History  Chief Complaint  Patient presents with   Arm Swelling    Amanda [name] is a 89 y.o. female history of dementia, PEs on Xarelto presented with right arm swelling that began 3 to 4 days ago.  Patient was urgent care was referred here.  Patient does have dementia and unable to provide history however this is mental baseline.  Granddaughter is present to provide history and states that patient had a mechanical fall a few days ago which she landed on the right side of her body.  Granddaughter denies head trauma and states the patient has not had any head or neck pain but states that her right arm does appear swollen.  Patient has been able to move and use her hand without weakness.  Unable to determine if patient said decree sensation.  Patient has been compliant with Xarelto.  Granddaughter states patient has not endorsed any chest pain, shortness of breath, nausea vomiting, fevers, recent illnesses, abdominal pain, new onset weakness.  HPI     Home Medications Prior to Admission medications   Medication Sig Start Date End Date Taking? Authorizing Provider  acetaminophen (TYLENOL) 325 MG tablet Take 2 tablets (650 mg total) by mouth every 6 (six) hours as needed for mild pain, moderate pain, fever or headache (or Fever >/= 101). 12/02/22   Rolly Salter, MD  albuterol (PROVENTIL) (2.5 MG/3ML) 0.083% nebulizer solution Take 3 mLs (2.5 mg total) by nebulization every 6 (six) hours as needed for wheezing or shortness of breath. 10/11/21   Charlott Holler, MD  arformoterol (BROVANA) 15 MCG/2ML NEBU Take 2 mLs (15 mcg total) by nebulization 2 (two) times daily. 04/02/23   Charlott Holler, MD  aspirin EC 81 MG tablet Take 1 tablet (81 mg total) by mouth daily. 12/24/22   Rolly Salter, MD  benzonatate (TESSALON) 100 MG capsule Take 1 capsule (100 mg total) by  mouth 3 (three) times daily as needed for cough. 12/29/22   Rising, [name], PA-C  citalopram (CELEXA) 20 MG tablet Take 1 tablet (20 mg total) by mouth every morning. 12/02/22   Rolly Salter, MD  cyanocobalamin (VITAMIN B12) 1000 MCG tablet Take 1,000 mcg by mouth every morning.    [provider]  donepezil (ARICEPT) 10 MG tablet Take 10 mg by mouth at bedtime.    [provider]  latanoprost (XALATAN) 0.005 % ophthalmic solution Place 1 drop into both eyes every evening.    [provider]  metoprolol succinate (TOPROL-XL) 25 MG 24 hr tablet Take 1 tablet (25 mg total) by mouth daily. 12/03/22   Rolly Salter, MD  Multiple Vitamins-Minerals (CENTRUM SILVER PO) Take 1 tablet by mouth every morning.    [provider]  pantoprazole (PROTONIX) 40 MG tablet Take 1 tablet (40 mg total) by mouth 2 (two) times daily. 12/02/22   Rolly Salter, MD  revefenacin [facility]) 175 MCG/3ML nebulizer solution Take 3 mLs (175 mcg total) by nebulization daily. 04/02/23   Charlott Holler, MD  RIVAROXABAN Carlena Hurl) VTE STARTER PACK (15 & 20 MG) Follow package directions: Take one 15mg  tablet by mouth twice a day. On day 22, switch to one 20mg  tablet once a day. Take with food. 12/02/22   Rolly Salter, MD      Allergies    Memantine    Review of  Systems   Review of Systems  Physical Exam Updated Vital Signs BP (!) 165/89   Pulse 63   Temp 98.7 F (37.1 C)   Resp 20   Ht 5\' 1"  (1.549 m)   Wt 49 kg   SpO2 94%   BMI 20.41 kg/m  Physical Exam Constitutional:      Comments: Dementia, baseline  HENT:     Right Ear: Tympanic membrane, ear canal and external ear normal.     Left Ear: Tympanic membrane, ear canal and external ear normal.  Eyes:     Extraocular Movements: Extraocular movements intact.     Conjunctiva/sclera: Conjunctivae normal.     Pupils: Pupils are equal, round, and reactive to light.  Cardiovascular:     Rate and Rhythm: Normal rate and regular  rhythm.     Pulses: Normal pulses.     Heart sounds: Normal heart sounds.  Pulmonary:     Effort: Pulmonary effort is normal. No respiratory distress.     Breath sounds: Normal breath sounds.  Abdominal:     Palpations: Abdomen is soft.     Tenderness: There is no abdominal tenderness. There is no guarding or rebound.  Musculoskeletal:     Cervical back: Normal range of motion. No tenderness.     Comments: Right upper extremity: Does appear edematous when compared to left, no overlying skin color changes or warmth noted, 5 out of 5 right-sided grip strength and able to flex and extend arm as well, no bony abnormalities were palpated Ribs: Ribs nontender to palpation without bony abnormalities Midline spine: No abnormalities or tenderness Soft compartments Pain not out of proportion  Skin:    General: Skin is warm and dry.     Capillary Refill: Capillary refill takes less than 2 seconds.     Findings: No erythema.  Neurological:     Mental Status: She is alert.  Psychiatric:     Comments: At baseline     ED Results / Procedures / Treatments   Labs (all labs ordered are listed, but only abnormal results are displayed) Labs Reviewed - No data to display  EKG None  Radiology DG Hand Complete Right  Result Date: 08/16/2023 CLINICAL DATA:  Hand swelling with redness EXAM: RIGHT HAND - COMPLETE 3+ VIEW COMPARISON:  None Available. FINDINGS: No fracture or malalignment. Mild degenerative change at the fifth PIP joint. Mild first CMC joint degenerative change. No soft tissue emphysema IMPRESSION: Mild degenerative changes. Electronically Signed   By: Jasmine Pang M.D.   On: 08/16/2023 23:48   US Venous Img Upper Right (DVT Study)  Result Date: 08/16/2023 CLINICAL DATA:  Right arm swelling. EXAM: RIGHT UPPER EXTREMITY VENOUS DOPPLER ULTRASOUND TECHNIQUE: Gray-scale sonography with graded compression, as well as color Doppler and duplex ultrasound were performed to evaluate the upper  extremity deep venous system from the level of the subclavian vein and including the jugular, axillary, basilic, radial, ulnar and upper cephalic vein. Spectral Doppler was utilized to evaluate flow at rest and with distal augmentation maneuvers. COMPARISON:  None Available. FINDINGS: Contralateral Subclavian Vein: Respiratory phasicity is normal and symmetric with the symptomatic side. No evidence of thrombus. Normal compressibility. Internal Jugular Vein: No evidence of thrombus. Normal compressibility, respiratory phasicity and response to augmentation. Subclavian Vein: No evidence of thrombus. Normal compressibility, respiratory phasicity and response to augmentation. Axillary Vein: No evidence of thrombus. Normal compressibility, respiratory phasicity and response to augmentation. Cephalic Vein: No evidence of thrombus. Normal compressibility, respiratory phasicity and response  to augmentation. Basilic Vein: No evidence of thrombus. Normal compressibility, respiratory phasicity and response to augmentation. Brachial Veins: No evidence of thrombus. Normal compressibility, respiratory phasicity and response to augmentation. Radial Veins: No evidence of thrombus. Normal compressibility, respiratory phasicity and response to augmentation. Ulnar Veins: No evidence of thrombus. Normal compressibility, respiratory phasicity and response to augmentation. Venous Reflux:  None visualized. Other Findings:  None visualized. IMPRESSION: No evidence of DVT within the RIGHT upper extremity. Electronically Signed   By: Aram Candela M.D.   On: 08/16/2023 22:04   DG Chest Port 1 View  Result Date: 08/16/2023 CLINICAL DATA:  Fall EXAM: PORTABLE CHEST 1 VIEW COMPARISON:  Chest x-ray 12/01/2022 FINDINGS: The heart is mildly enlarged. The lungs are clear. There is no pleural effusion or pneumothorax. No acute fractures are identified. Degenerative changes affect both shoulders, right greater than left. IMPRESSION: Mild  cardiomegaly. No evidence for acute chest injury. Electronically Signed   By: Darliss Cheney M.D.   On: 08/16/2023 21:52   DG Humerus Right  Result Date: 08/16/2023 CLINICAL DATA:  Arm swelling EXAM: RIGHT HUMERUS - 2+ VIEW COMPARISON:  None Available. FINDINGS: There severe degenerative changes of the glenohumeral joint with joint space narrowing, sclerosis, osteophyte formation and subchondral cystic change. There are moderate degenerative changes of the acromioclavicular joint. The bones are mildly osteopenic. Soft tissues are within normal limits. IMPRESSION: Severe degenerative changes of the glenohumeral joint and moderate degenerative changes of the acromioclavicular joint. Electronically Signed   By: Darliss Cheney M.D.   On: 08/16/2023 21:51   CT Cervical Spine Wo Contrast  Result Date: 08/16/2023 CLINICAL DATA:  Fall EXAM: CT CERVICAL SPINE WITHOUT CONTRAST TECHNIQUE: Multidetector CT imaging of the cervical spine was performed without intravenous contrast. Multiplanar CT image reconstructions were also generated. RADIATION DOSE REDUCTION: This exam was performed according to the departmental dose-optimization program which includes automated exposure control, adjustment of the mA and/or kV according to patient size and/or use of iterative reconstruction technique. COMPARISON:  None Available. FINDINGS: Alignment: There is 2 mm of anterolisthesis at C4-C5 and T2-T3 which is favored is degenerative. Alignment is otherwise anatomic. Skull base and vertebrae: No acute fracture. No primary bone lesion or focal pathologic process. Bones are diffusely osteopenic. Soft tissues and spinal canal: No prevertebral fluid or swelling. No visible canal hematoma. Disc levels: There is mild disc space narrowing throughout the cervical spine. Facet arthropathy is seen predominantly in the upper cervical levels. No significant central canal or neural foraminal stenosis identified. Upper chest: Emphysema present.  Other: None. IMPRESSION: 1. No acute fracture or traumatic subluxation. 2. Mild degenerative changes. 3. Emphysema. Emphysema (ICD10-J43.9). Electronically Signed   By: Darliss Cheney M.D.   On: 08/16/2023 21:48   CT Head Wo Contrast  Result Date: 08/16/2023 CLINICAL DATA:  Fall EXAM: CT HEAD WITHOUT CONTRAST TECHNIQUE: Contiguous axial images were obtained from the base of the skull through the vertex without intravenous contrast. RADIATION DOSE REDUCTION: This exam was performed according to the departmental dose-optimization program which includes automated exposure control, adjustment of the mA and/or kV according to patient size and/or use of iterative reconstruction technique. COMPARISON:  Head CT 07/29/2021 FINDINGS: Brain: No evidence of acute infarction, hemorrhage, hydrocephalus, extra-axial collection or mass lesion/mass effect. There is moderate diffuse atrophy. There is an old lacunar infarct in the left basal ganglia. Vascular: Atherosclerotic calcifications are present within the cavernous internal carotid arteries. Skull: Normal. Negative for fracture or focal lesion. Sinuses/Orbits: No acute finding. Other: None. IMPRESSION:  1. No acute intracranial process. 2. Moderate diffuse atrophy. 3. Old left basal ganglia lacunar infarct. Electronically Signed   By: Darliss Cheney M.D.   On: 08/16/2023 21:46    Procedures Procedures    Medications Ordered in ED Medications - No data to display  ED Course/ Medical Decision Making/ A&P                                 Medical Decision Making Amount and/or Complexity of Data Reviewed Radiology: ordered.   Amanda [name] 89 y.o. presented today for right arm swelling after a fall. Working DDx that I considered at this time includes, but not limited to, traumatic DVT, fracture, ICH, epidural/subdural hematoma, cervical vertebral fracture, basilar skull fracture.  R/o DDx: traumatic DVT, fracture, ICH, epidural/subdural hematoma, cervical  vertebral fracture, basilar skull fracture: These are considered less likely due to history of present illness, physical exam, labs/imaging findings  Review of prior external notes: 05/22/2023 office visit  Unique Tests and My Interpretation:  Chest x-ray: Unremarkable Right humerus x-ray: Severe glenohumeral arthritis noted CT head without contrast: Unremarkable CT cervical spine without contrast: Unremarkable Right hand x-ray: No acute changes DVT study: No acute findings  Discussion with Independent Historian:  Granddaughter  Discussion of Management of Tests: None  Risk: Low: based on diagnostic testing/clinical impression and treatment plan  Risk Stratification Score: None  Staffed with Charm Barges, MD  Plan: On exam patient was in no acute distress with stable vitals however was noted to be altered on exam.  Granddaughter stated that this is her mental baseline.  Patient did have witnessed mechanical fall few days ago and does not sound like she hit her head however given the fact she is on Xarelto and has dementia will obtain head and neck imaging.  Patient landed on her right side and so will obtain shoulder x-ray along with chest x-ray to rule out rib fracture.  Patient's right arm does appear edematous when compared to the left with tenderness when I palpate without skin color changes and so will obtain ultrasound to rule out traumatic DVT.  Skin was not warm or erythematous and indicative of cellulitis.  Patient's arm may be swollen from her fall as she is on Xarelto however need to rule out other pathology at this time.  Ultrasound negative for DVT and imaging was negative as well.  Patient is hand was swollen when I went to go recheck on the patient and although I did not palpate any bony abnormalities or patient did not endorse any tenderness we will get x-ray to rule out fractures as patient most likely bruised her hand when she caught himself when she fell.  I spoke to the  granddaughter she states that patient is alone however they check on her every day or so to make sure that she is taking her Xarelto and they have not noted any missed doses.  Anticipate discharge afterwards.  Right hand x-ray negative.  Will discharge with primary care follow-up and Tylenol every 6 hours and ice hand.  Patient was given return precautions. Patient stable for discharge at this time.  Patient verbalized understanding of plan.         Final Clinical Impression(s) / ED Diagnoses Final diagnoses:  Edema of right upper arm    Rx / DC Orders ED Discharge Orders     None         Netta Corrigan,  PA-C 08/16/23 2352    Terrilee Files, MD 08/17/23 1013

## 2023-08-16 NOTE — ED Notes (Signed)
ED Provider at bedside. 

## 2023-08-16 NOTE — ED Triage Notes (Signed)
Pt c/o RT arm pain, redness and swelling. Family reports knowing today, pt reports x 3-4 days. Referred by UC

## 2023-08-17 DIAGNOSIS — R54 Age-related physical debility: Secondary | ICD-10-CM | POA: Diagnosis not present

## 2023-08-17 DIAGNOSIS — L0889 Other specified local infections of the skin and subcutaneous tissue: Secondary | ICD-10-CM | POA: Diagnosis not present

## 2023-08-17 DIAGNOSIS — E538 Deficiency of other specified B group vitamins: Secondary | ICD-10-CM | POA: Diagnosis not present

## 2023-08-17 DIAGNOSIS — M19011 Primary osteoarthritis, right shoulder: Secondary | ICD-10-CM | POA: Diagnosis not present

## 2023-08-17 DIAGNOSIS — R2681 Unsteadiness on feet: Secondary | ICD-10-CM | POA: Diagnosis not present

## 2023-08-17 DIAGNOSIS — T148XXA Other injury of unspecified body region, initial encounter: Secondary | ICD-10-CM | POA: Diagnosis not present

## 2023-08-17 DIAGNOSIS — Z9181 History of falling: Secondary | ICD-10-CM | POA: Diagnosis not present

## 2023-08-17 DIAGNOSIS — I10 Essential (primary) hypertension: Secondary | ICD-10-CM | POA: Diagnosis not present

## 2023-08-17 DIAGNOSIS — Z79899 Other long term (current) drug therapy: Secondary | ICD-10-CM | POA: Diagnosis not present

## 2023-08-17 DIAGNOSIS — S50862A Insect bite (nonvenomous) of left forearm, initial encounter: Secondary | ICD-10-CM | POA: Diagnosis not present

## 2023-08-17 DIAGNOSIS — Z23 Encounter for immunization: Secondary | ICD-10-CM | POA: Diagnosis not present

## 2023-08-17 DIAGNOSIS — D509 Iron deficiency anemia, unspecified: Secondary | ICD-10-CM | POA: Diagnosis not present

## 2023-08-22 ENCOUNTER — Encounter: Payer: Self-pay | Admitting: Podiatry

## 2023-08-22 ENCOUNTER — Ambulatory Visit: Payer: Medicare Other | Admitting: Podiatry

## 2023-08-22 DIAGNOSIS — Q828 Other specified congenital malformations of skin: Secondary | ICD-10-CM | POA: Diagnosis not present

## 2023-08-22 DIAGNOSIS — L84 Corns and callosities: Secondary | ICD-10-CM | POA: Diagnosis not present

## 2023-08-22 DIAGNOSIS — D689 Coagulation defect, unspecified: Secondary | ICD-10-CM | POA: Diagnosis not present

## 2023-08-22 DIAGNOSIS — M79674 Pain in right toe(s): Secondary | ICD-10-CM | POA: Diagnosis not present

## 2023-08-22 DIAGNOSIS — M79675 Pain in left toe(s): Secondary | ICD-10-CM

## 2023-08-22 DIAGNOSIS — B351 Tinea unguium: Secondary | ICD-10-CM | POA: Diagnosis not present

## 2023-08-22 NOTE — Progress Notes (Signed)
  Subjective:  Patient ID: Amanda [name], female    DOB: Jan 07, 1933,  MRN: [id number]  Amanda [name] presents to clinic today for: at risk foot care with h/o clotting disorder and corn(s)  right foot, porokeratotic lesion(s) of both feet and painful mycotic nails. Painful toenails interfere with ambulation. Aggravating factors include wearing enclosed shoe gear. Pain is relieved with periodic professional debridement. Painful corns and porokeratotic lesion(s) aggravated when weightbearing with and without shoegear. Pain is relieved with periodic professional debridement. Patient is accompanied by her granddaughter on today's visit. Chief Complaint  Patient presents with   RFC    RFC     PCP is Aliene Beams, MD. Amanda [name] 08/17/2023.  Allergies  Allergen Reactions   Memantine Other (See Comments)    Review of Systems: Negative except as noted in the HPI.  Objective: No changes noted in today's physical examination. There were no vitals filed for this visit.  Amanda [name] is a pleasant 89 y.o. female in NAD. AAO x 3.  Vascular Examination: Capillary refill time <3 seconds b/l LE. Palpable pedal pulses b/l LE. Digital hair present b/l. No pedal edema b/l. Skin temperature gradient WNL b/l. No varicosities b/l. No cyanosis or clubbing noted b/l LE.Marland Kitchen  Dermatological Examination: Pedal skin with normal turgor, texture and tone b/l. No open wounds. No interdigital macerations b/l. Toenails 1-5 b/l thickened, discolored, dystrophic with subungual debris. There is pain on palpation to dorsal aspect of nailplates. Porokeratotic lesion(s) right heel, submet head 1 right foot, and submet head 5 left foot. No erythema, no edema, no drainage, no fluctuance..  Neurological Examination: Protective sensation intact with 10 gram monofilament b/l LE. Vibratory sensation intact b/l LE.   Musculoskeletal Examination: Muscle strength 5/5 to all lower extremity muscle groups bilaterally. Hammertoe(s) noted  to the 2-5 bilaterally.  Assessment/Plan: 1. Pain due to onychomycosis of toenails of both feet   2. Corns   3. Porokeratosis   4. Clotting disorder [facility])     -Patient was evaluated and treated. All patient's and/or POA's questions/concerns answered on today's visit. -No new findings. No new orders. -Family member/caregiver/POA instructed to continue pressure precautions floating heels when patient is in bed. -Patient to continue soft, supportive shoe gear daily. -Mycotic toenails 1-5 bilaterally were debrided in length and girth with sterile nail nippers and dremel without incident. -Corn(s) R 4th toe pared utilizing sterile scalpel blade without complication or incident. Total number debrided=1. -Porokeratotic lesion(s) right heel, submet head 1 right foot, and submet head 5 left foot pared and enucleated with sterile currette without incident. Total number of lesions debrided=3. -Patient/POA to call should there be question/concern in the interim.   Return in about 3 months (around 11/22/2023).  Freddie Breech, DPM

## 2023-09-10 ENCOUNTER — Telehealth: Payer: Self-pay | Admitting: Internal Medicine

## 2023-09-10 DIAGNOSIS — E538 Deficiency of other specified B group vitamins: Secondary | ICD-10-CM | POA: Diagnosis not present

## 2023-09-10 DIAGNOSIS — D6869 Other thrombophilia: Secondary | ICD-10-CM | POA: Diagnosis not present

## 2023-09-10 DIAGNOSIS — D509 Iron deficiency anemia, unspecified: Secondary | ICD-10-CM | POA: Diagnosis not present

## 2023-09-10 DIAGNOSIS — G301 Alzheimer's disease with late onset: Secondary | ICD-10-CM | POA: Diagnosis not present

## 2023-09-10 DIAGNOSIS — I10 Essential (primary) hypertension: Secondary | ICD-10-CM | POA: Diagnosis not present

## 2023-09-10 DIAGNOSIS — Z09 Encounter for follow-up examination after completed treatment for conditions other than malignant neoplasm: Secondary | ICD-10-CM | POA: Diagnosis not present

## 2023-09-10 NOTE — Telephone Encounter (Signed)
No number left for daughter. Left voicemail on mobile number on file.

## 2023-09-10 NOTE — Telephone Encounter (Signed)
Pt daughter came in bc her mother has been getting to [employer] for the arformoterol [facility]) 15 MCG/2ML NEBU [phone]

## 2023-09-11 NOTE — Telephone Encounter (Signed)
Patient's daughter called pharmacy and put medication on hold. She spoke with Patty @ [facility]. She says her mother has 6 boxes of [place].Nothing further needed.

## 2023-09-17 ENCOUNTER — Other Ambulatory Visit: Payer: Self-pay | Admitting: Nurse Practitioner

## 2023-09-17 DIAGNOSIS — R195 Other fecal abnormalities: Secondary | ICD-10-CM

## 2023-09-17 DIAGNOSIS — K219 Gastro-esophageal reflux disease without esophagitis: Secondary | ICD-10-CM | POA: Diagnosis not present

## 2023-09-17 DIAGNOSIS — D649 Anemia, unspecified: Secondary | ICD-10-CM | POA: Diagnosis not present

## 2023-09-19 ENCOUNTER — Ambulatory Visit
Admission: RE | Admit: 2023-09-19 | Discharge: 2023-09-19 | Disposition: A | Discharge status: Home / Self Care | Payer: Medicare Other | Source: Ambulatory Visit | Attending: Nurse Practitioner | Admitting: Nurse Practitioner

## 2023-09-19 DIAGNOSIS — D649 Anemia, unspecified: Secondary | ICD-10-CM

## 2023-09-19 DIAGNOSIS — K5730 Diverticulosis of large intestine without perforation or abscess without bleeding: Secondary | ICD-10-CM | POA: Diagnosis not present

## 2023-09-19 DIAGNOSIS — I700 Atherosclerosis of aorta: Secondary | ICD-10-CM | POA: Diagnosis not present

## 2023-09-19 DIAGNOSIS — R195 Other fecal abnormalities: Secondary | ICD-10-CM

## 2023-09-19 DIAGNOSIS — K921 Melena: Secondary | ICD-10-CM | POA: Diagnosis not present

## 2023-09-19 DIAGNOSIS — R933 Abnormal findings on diagnostic imaging of other parts of digestive tract: Secondary | ICD-10-CM | POA: Diagnosis not present

## 2023-09-19 MED ORDER — IOPAMIDOL (ISOVUE-300) INJECTION 61%
100.0000 mL | Freq: Once | INTRAVENOUS | Status: AC | PRN
  Administered 2023-09-19: 100 mL via INTRAVENOUS

## 2023-09-21 DIAGNOSIS — I5022 Chronic systolic (congestive) heart failure: Secondary | ICD-10-CM | POA: Diagnosis not present

## 2023-09-21 DIAGNOSIS — M19011 Primary osteoarthritis, right shoulder: Secondary | ICD-10-CM | POA: Diagnosis not present

## 2023-09-21 DIAGNOSIS — J432 Centrilobular emphysema: Secondary | ICD-10-CM | POA: Diagnosis not present

## 2023-09-21 DIAGNOSIS — D6869 Other thrombophilia: Secondary | ICD-10-CM | POA: Diagnosis not present

## 2023-09-21 DIAGNOSIS — G301 Alzheimer's disease with late onset: Secondary | ICD-10-CM | POA: Diagnosis not present

## 2023-09-21 DIAGNOSIS — I739 Peripheral vascular disease, unspecified: Secondary | ICD-10-CM | POA: Diagnosis not present

## 2023-09-21 DIAGNOSIS — D509 Iron deficiency anemia, unspecified: Secondary | ICD-10-CM | POA: Diagnosis not present

## 2023-09-21 DIAGNOSIS — Z87891 Personal history of nicotine dependence: Secondary | ICD-10-CM | POA: Diagnosis not present

## 2023-09-21 DIAGNOSIS — E7800 Pure hypercholesterolemia, unspecified: Secondary | ICD-10-CM | POA: Diagnosis not present

## 2023-09-21 DIAGNOSIS — M810 Age-related osteoporosis without current pathological fracture: Secondary | ICD-10-CM | POA: Diagnosis not present

## 2023-09-21 DIAGNOSIS — I272 Pulmonary hypertension, unspecified: Secondary | ICD-10-CM | POA: Diagnosis not present

## 2023-09-21 DIAGNOSIS — I700 Atherosclerosis of aorta: Secondary | ICD-10-CM | POA: Diagnosis not present

## 2023-09-21 DIAGNOSIS — E440 Moderate protein-calorie malnutrition: Secondary | ICD-10-CM | POA: Diagnosis not present

## 2023-09-21 DIAGNOSIS — Z7901 Long term (current) use of anticoagulants: Secondary | ICD-10-CM | POA: Diagnosis not present

## 2023-09-21 DIAGNOSIS — G43909 Migraine, unspecified, not intractable, without status migrainosus: Secondary | ICD-10-CM | POA: Diagnosis not present

## 2023-09-21 DIAGNOSIS — Z602 Problems related to living alone: Secondary | ICD-10-CM | POA: Diagnosis not present

## 2023-09-21 DIAGNOSIS — I2699 Other pulmonary embolism without acute cor pulmonale: Secondary | ICD-10-CM | POA: Diagnosis not present

## 2023-09-21 DIAGNOSIS — Z7982 Long term (current) use of aspirin: Secondary | ICD-10-CM | POA: Diagnosis not present

## 2023-09-21 DIAGNOSIS — I110 Hypertensive heart disease with heart failure: Secondary | ICD-10-CM | POA: Diagnosis not present

## 2023-09-21 DIAGNOSIS — E538 Deficiency of other specified B group vitamins: Secondary | ICD-10-CM | POA: Diagnosis not present

## 2023-09-21 DIAGNOSIS — M19012 Primary osteoarthritis, left shoulder: Secondary | ICD-10-CM | POA: Diagnosis not present

## 2023-09-22 DIAGNOSIS — I110 Hypertensive heart disease with heart failure: Secondary | ICD-10-CM | POA: Diagnosis not present

## 2023-09-22 DIAGNOSIS — I5022 Chronic systolic (congestive) heart failure: Secondary | ICD-10-CM | POA: Diagnosis not present

## 2023-09-22 DIAGNOSIS — I739 Peripheral vascular disease, unspecified: Secondary | ICD-10-CM | POA: Diagnosis not present

## 2023-09-22 DIAGNOSIS — D509 Iron deficiency anemia, unspecified: Secondary | ICD-10-CM | POA: Diagnosis not present

## 2023-09-22 DIAGNOSIS — E538 Deficiency of other specified B group vitamins: Secondary | ICD-10-CM | POA: Diagnosis not present

## 2023-09-22 DIAGNOSIS — I272 Pulmonary hypertension, unspecified: Secondary | ICD-10-CM | POA: Diagnosis not present

## 2023-09-22 DIAGNOSIS — E7800 Pure hypercholesterolemia, unspecified: Secondary | ICD-10-CM | POA: Diagnosis not present

## 2023-09-22 DIAGNOSIS — Z7982 Long term (current) use of aspirin: Secondary | ICD-10-CM | POA: Diagnosis not present

## 2023-09-22 DIAGNOSIS — E440 Moderate protein-calorie malnutrition: Secondary | ICD-10-CM | POA: Diagnosis not present

## 2023-09-22 DIAGNOSIS — G43909 Migraine, unspecified, not intractable, without status migrainosus: Secondary | ICD-10-CM | POA: Diagnosis not present

## 2023-09-22 DIAGNOSIS — Z602 Problems related to living alone: Secondary | ICD-10-CM | POA: Diagnosis not present

## 2023-09-22 DIAGNOSIS — M19012 Primary osteoarthritis, left shoulder: Secondary | ICD-10-CM | POA: Diagnosis not present

## 2023-09-22 DIAGNOSIS — Z87891 Personal history of nicotine dependence: Secondary | ICD-10-CM | POA: Diagnosis not present

## 2023-09-22 DIAGNOSIS — D6869 Other thrombophilia: Secondary | ICD-10-CM | POA: Diagnosis not present

## 2023-09-22 DIAGNOSIS — M810 Age-related osteoporosis without current pathological fracture: Secondary | ICD-10-CM | POA: Diagnosis not present

## 2023-09-22 DIAGNOSIS — M19011 Primary osteoarthritis, right shoulder: Secondary | ICD-10-CM | POA: Diagnosis not present

## 2023-09-22 DIAGNOSIS — G301 Alzheimer's disease with late onset: Secondary | ICD-10-CM | POA: Diagnosis not present

## 2023-09-22 DIAGNOSIS — I2699 Other pulmonary embolism without acute cor pulmonale: Secondary | ICD-10-CM | POA: Diagnosis not present

## 2023-09-22 DIAGNOSIS — I700 Atherosclerosis of aorta: Secondary | ICD-10-CM | POA: Diagnosis not present

## 2023-09-22 DIAGNOSIS — Z7901 Long term (current) use of anticoagulants: Secondary | ICD-10-CM | POA: Diagnosis not present

## 2023-09-22 DIAGNOSIS — J432 Centrilobular emphysema: Secondary | ICD-10-CM | POA: Diagnosis not present

## 2023-09-25 DIAGNOSIS — E7800 Pure hypercholesterolemia, unspecified: Secondary | ICD-10-CM | POA: Diagnosis not present

## 2023-09-25 DIAGNOSIS — I2699 Other pulmonary embolism without acute cor pulmonale: Secondary | ICD-10-CM | POA: Diagnosis not present

## 2023-09-25 DIAGNOSIS — D6869 Other thrombophilia: Secondary | ICD-10-CM | POA: Diagnosis not present

## 2023-09-25 DIAGNOSIS — G301 Alzheimer's disease with late onset: Secondary | ICD-10-CM | POA: Diagnosis not present

## 2023-09-25 DIAGNOSIS — M19012 Primary osteoarthritis, left shoulder: Secondary | ICD-10-CM | POA: Diagnosis not present

## 2023-09-25 DIAGNOSIS — I5022 Chronic systolic (congestive) heart failure: Secondary | ICD-10-CM | POA: Diagnosis not present

## 2023-09-25 DIAGNOSIS — G43909 Migraine, unspecified, not intractable, without status migrainosus: Secondary | ICD-10-CM | POA: Diagnosis not present

## 2023-09-25 DIAGNOSIS — H401234 Low-tension glaucoma, bilateral, indeterminate stage: Secondary | ICD-10-CM | POA: Diagnosis not present

## 2023-09-25 DIAGNOSIS — I272 Pulmonary hypertension, unspecified: Secondary | ICD-10-CM | POA: Diagnosis not present

## 2023-09-25 DIAGNOSIS — M19011 Primary osteoarthritis, right shoulder: Secondary | ICD-10-CM | POA: Diagnosis not present

## 2023-09-25 DIAGNOSIS — Z7901 Long term (current) use of anticoagulants: Secondary | ICD-10-CM | POA: Diagnosis not present

## 2023-09-25 DIAGNOSIS — Z7982 Long term (current) use of aspirin: Secondary | ICD-10-CM | POA: Diagnosis not present

## 2023-09-25 DIAGNOSIS — Z87891 Personal history of nicotine dependence: Secondary | ICD-10-CM | POA: Diagnosis not present

## 2023-09-25 DIAGNOSIS — Z961 Presence of intraocular lens: Secondary | ICD-10-CM | POA: Diagnosis not present

## 2023-09-25 DIAGNOSIS — I110 Hypertensive heart disease with heart failure: Secondary | ICD-10-CM | POA: Diagnosis not present

## 2023-09-25 DIAGNOSIS — D509 Iron deficiency anemia, unspecified: Secondary | ICD-10-CM | POA: Diagnosis not present

## 2023-09-25 DIAGNOSIS — H1045 Other chronic allergic conjunctivitis: Secondary | ICD-10-CM | POA: Diagnosis not present

## 2023-09-25 DIAGNOSIS — J432 Centrilobular emphysema: Secondary | ICD-10-CM | POA: Diagnosis not present

## 2023-09-25 DIAGNOSIS — M810 Age-related osteoporosis without current pathological fracture: Secondary | ICD-10-CM | POA: Diagnosis not present

## 2023-09-25 DIAGNOSIS — H04123 Dry eye syndrome of bilateral lacrimal glands: Secondary | ICD-10-CM | POA: Diagnosis not present

## 2023-09-25 DIAGNOSIS — Z602 Problems related to living alone: Secondary | ICD-10-CM | POA: Diagnosis not present

## 2023-09-25 DIAGNOSIS — E538 Deficiency of other specified B group vitamins: Secondary | ICD-10-CM | POA: Diagnosis not present

## 2023-09-25 DIAGNOSIS — I700 Atherosclerosis of aorta: Secondary | ICD-10-CM | POA: Diagnosis not present

## 2023-09-25 DIAGNOSIS — I739 Peripheral vascular disease, unspecified: Secondary | ICD-10-CM | POA: Diagnosis not present

## 2023-09-25 DIAGNOSIS — E440 Moderate protein-calorie malnutrition: Secondary | ICD-10-CM | POA: Diagnosis not present

## 2023-09-26 DIAGNOSIS — E440 Moderate protein-calorie malnutrition: Secondary | ICD-10-CM | POA: Diagnosis not present

## 2023-09-26 DIAGNOSIS — M19011 Primary osteoarthritis, right shoulder: Secondary | ICD-10-CM | POA: Diagnosis not present

## 2023-09-26 DIAGNOSIS — J432 Centrilobular emphysema: Secondary | ICD-10-CM | POA: Diagnosis not present

## 2023-09-26 DIAGNOSIS — I739 Peripheral vascular disease, unspecified: Secondary | ICD-10-CM | POA: Diagnosis not present

## 2023-09-26 DIAGNOSIS — I5022 Chronic systolic (congestive) heart failure: Secondary | ICD-10-CM | POA: Diagnosis not present

## 2023-09-26 DIAGNOSIS — M19012 Primary osteoarthritis, left shoulder: Secondary | ICD-10-CM | POA: Diagnosis not present

## 2023-09-26 DIAGNOSIS — D6869 Other thrombophilia: Secondary | ICD-10-CM | POA: Diagnosis not present

## 2023-09-26 DIAGNOSIS — M810 Age-related osteoporosis without current pathological fracture: Secondary | ICD-10-CM | POA: Diagnosis not present

## 2023-09-26 DIAGNOSIS — Z602 Problems related to living alone: Secondary | ICD-10-CM | POA: Diagnosis not present

## 2023-09-26 DIAGNOSIS — Z87891 Personal history of nicotine dependence: Secondary | ICD-10-CM | POA: Diagnosis not present

## 2023-09-26 DIAGNOSIS — E7800 Pure hypercholesterolemia, unspecified: Secondary | ICD-10-CM | POA: Diagnosis not present

## 2023-09-26 DIAGNOSIS — I110 Hypertensive heart disease with heart failure: Secondary | ICD-10-CM | POA: Diagnosis not present

## 2023-09-26 DIAGNOSIS — Z7901 Long term (current) use of anticoagulants: Secondary | ICD-10-CM | POA: Diagnosis not present

## 2023-09-26 DIAGNOSIS — E538 Deficiency of other specified B group vitamins: Secondary | ICD-10-CM | POA: Diagnosis not present

## 2023-09-26 DIAGNOSIS — Z7982 Long term (current) use of aspirin: Secondary | ICD-10-CM | POA: Diagnosis not present

## 2023-09-26 DIAGNOSIS — G43909 Migraine, unspecified, not intractable, without status migrainosus: Secondary | ICD-10-CM | POA: Diagnosis not present

## 2023-09-26 DIAGNOSIS — I2699 Other pulmonary embolism without acute cor pulmonale: Secondary | ICD-10-CM | POA: Diagnosis not present

## 2023-09-26 DIAGNOSIS — D509 Iron deficiency anemia, unspecified: Secondary | ICD-10-CM | POA: Diagnosis not present

## 2023-09-26 DIAGNOSIS — I272 Pulmonary hypertension, unspecified: Secondary | ICD-10-CM | POA: Diagnosis not present

## 2023-09-26 DIAGNOSIS — G301 Alzheimer's disease with late onset: Secondary | ICD-10-CM | POA: Diagnosis not present

## 2023-09-26 DIAGNOSIS — I700 Atherosclerosis of aorta: Secondary | ICD-10-CM | POA: Diagnosis not present

## 2023-09-27 DIAGNOSIS — I272 Pulmonary hypertension, unspecified: Secondary | ICD-10-CM | POA: Diagnosis not present

## 2023-09-27 DIAGNOSIS — I5022 Chronic systolic (congestive) heart failure: Secondary | ICD-10-CM | POA: Diagnosis not present

## 2023-09-27 DIAGNOSIS — M19012 Primary osteoarthritis, left shoulder: Secondary | ICD-10-CM | POA: Diagnosis not present

## 2023-09-27 DIAGNOSIS — D6869 Other thrombophilia: Secondary | ICD-10-CM | POA: Diagnosis not present

## 2023-09-27 DIAGNOSIS — D509 Iron deficiency anemia, unspecified: Secondary | ICD-10-CM | POA: Diagnosis not present

## 2023-09-27 DIAGNOSIS — G43909 Migraine, unspecified, not intractable, without status migrainosus: Secondary | ICD-10-CM | POA: Diagnosis not present

## 2023-09-27 DIAGNOSIS — Z7901 Long term (current) use of anticoagulants: Secondary | ICD-10-CM | POA: Diagnosis not present

## 2023-09-27 DIAGNOSIS — M19011 Primary osteoarthritis, right shoulder: Secondary | ICD-10-CM | POA: Diagnosis not present

## 2023-09-27 DIAGNOSIS — J432 Centrilobular emphysema: Secondary | ICD-10-CM | POA: Diagnosis not present

## 2023-09-27 DIAGNOSIS — E538 Deficiency of other specified B group vitamins: Secondary | ICD-10-CM | POA: Diagnosis not present

## 2023-09-27 DIAGNOSIS — M810 Age-related osteoporosis without current pathological fracture: Secondary | ICD-10-CM | POA: Diagnosis not present

## 2023-09-27 DIAGNOSIS — E440 Moderate protein-calorie malnutrition: Secondary | ICD-10-CM | POA: Diagnosis not present

## 2023-09-27 DIAGNOSIS — I700 Atherosclerosis of aorta: Secondary | ICD-10-CM | POA: Diagnosis not present

## 2023-09-27 DIAGNOSIS — I110 Hypertensive heart disease with heart failure: Secondary | ICD-10-CM | POA: Diagnosis not present

## 2023-09-27 DIAGNOSIS — Z602 Problems related to living alone: Secondary | ICD-10-CM | POA: Diagnosis not present

## 2023-09-27 DIAGNOSIS — G301 Alzheimer's disease with late onset: Secondary | ICD-10-CM | POA: Diagnosis not present

## 2023-09-27 DIAGNOSIS — Z87891 Personal history of nicotine dependence: Secondary | ICD-10-CM | POA: Diagnosis not present

## 2023-09-27 DIAGNOSIS — E7800 Pure hypercholesterolemia, unspecified: Secondary | ICD-10-CM | POA: Diagnosis not present

## 2023-09-27 DIAGNOSIS — Z7982 Long term (current) use of aspirin: Secondary | ICD-10-CM | POA: Diagnosis not present

## 2023-09-27 DIAGNOSIS — I739 Peripheral vascular disease, unspecified: Secondary | ICD-10-CM | POA: Diagnosis not present

## 2023-09-27 DIAGNOSIS — I2699 Other pulmonary embolism without acute cor pulmonale: Secondary | ICD-10-CM | POA: Diagnosis not present

## 2023-10-01 DIAGNOSIS — J432 Centrilobular emphysema: Secondary | ICD-10-CM | POA: Diagnosis not present

## 2023-10-01 DIAGNOSIS — Z7982 Long term (current) use of aspirin: Secondary | ICD-10-CM | POA: Diagnosis not present

## 2023-10-01 DIAGNOSIS — I2699 Other pulmonary embolism without acute cor pulmonale: Secondary | ICD-10-CM | POA: Diagnosis not present

## 2023-10-01 DIAGNOSIS — M19012 Primary osteoarthritis, left shoulder: Secondary | ICD-10-CM | POA: Diagnosis not present

## 2023-10-01 DIAGNOSIS — Z87891 Personal history of nicotine dependence: Secondary | ICD-10-CM | POA: Diagnosis not present

## 2023-10-01 DIAGNOSIS — I5022 Chronic systolic (congestive) heart failure: Secondary | ICD-10-CM | POA: Diagnosis not present

## 2023-10-01 DIAGNOSIS — D509 Iron deficiency anemia, unspecified: Secondary | ICD-10-CM | POA: Diagnosis not present

## 2023-10-01 DIAGNOSIS — I110 Hypertensive heart disease with heart failure: Secondary | ICD-10-CM | POA: Diagnosis not present

## 2023-10-01 DIAGNOSIS — I739 Peripheral vascular disease, unspecified: Secondary | ICD-10-CM | POA: Diagnosis not present

## 2023-10-01 DIAGNOSIS — G43909 Migraine, unspecified, not intractable, without status migrainosus: Secondary | ICD-10-CM | POA: Diagnosis not present

## 2023-10-01 DIAGNOSIS — G301 Alzheimer's disease with late onset: Secondary | ICD-10-CM | POA: Diagnosis not present

## 2023-10-01 DIAGNOSIS — I700 Atherosclerosis of aorta: Secondary | ICD-10-CM | POA: Diagnosis not present

## 2023-10-01 DIAGNOSIS — Z7901 Long term (current) use of anticoagulants: Secondary | ICD-10-CM | POA: Diagnosis not present

## 2023-10-01 DIAGNOSIS — M810 Age-related osteoporosis without current pathological fracture: Secondary | ICD-10-CM | POA: Diagnosis not present

## 2023-10-01 DIAGNOSIS — E440 Moderate protein-calorie malnutrition: Secondary | ICD-10-CM | POA: Diagnosis not present

## 2023-10-01 DIAGNOSIS — D6869 Other thrombophilia: Secondary | ICD-10-CM | POA: Diagnosis not present

## 2023-10-01 DIAGNOSIS — M19011 Primary osteoarthritis, right shoulder: Secondary | ICD-10-CM | POA: Diagnosis not present

## 2023-10-01 DIAGNOSIS — Z602 Problems related to living alone: Secondary | ICD-10-CM | POA: Diagnosis not present

## 2023-10-01 DIAGNOSIS — E7800 Pure hypercholesterolemia, unspecified: Secondary | ICD-10-CM | POA: Diagnosis not present

## 2023-10-01 DIAGNOSIS — I272 Pulmonary hypertension, unspecified: Secondary | ICD-10-CM | POA: Diagnosis not present

## 2023-10-01 DIAGNOSIS — E538 Deficiency of other specified B group vitamins: Secondary | ICD-10-CM | POA: Diagnosis not present

## 2023-10-03 DIAGNOSIS — Z87891 Personal history of nicotine dependence: Secondary | ICD-10-CM | POA: Diagnosis not present

## 2023-10-03 DIAGNOSIS — I5022 Chronic systolic (congestive) heart failure: Secondary | ICD-10-CM | POA: Diagnosis not present

## 2023-10-03 DIAGNOSIS — Z7982 Long term (current) use of aspirin: Secondary | ICD-10-CM | POA: Diagnosis not present

## 2023-10-03 DIAGNOSIS — Z602 Problems related to living alone: Secondary | ICD-10-CM | POA: Diagnosis not present

## 2023-10-03 DIAGNOSIS — M810 Age-related osteoporosis without current pathological fracture: Secondary | ICD-10-CM | POA: Diagnosis not present

## 2023-10-03 DIAGNOSIS — D6869 Other thrombophilia: Secondary | ICD-10-CM | POA: Diagnosis not present

## 2023-10-03 DIAGNOSIS — G301 Alzheimer's disease with late onset: Secondary | ICD-10-CM | POA: Diagnosis not present

## 2023-10-03 DIAGNOSIS — E440 Moderate protein-calorie malnutrition: Secondary | ICD-10-CM | POA: Diagnosis not present

## 2023-10-03 DIAGNOSIS — I700 Atherosclerosis of aorta: Secondary | ICD-10-CM | POA: Diagnosis not present

## 2023-10-03 DIAGNOSIS — D509 Iron deficiency anemia, unspecified: Secondary | ICD-10-CM | POA: Diagnosis not present

## 2023-10-03 DIAGNOSIS — Z7901 Long term (current) use of anticoagulants: Secondary | ICD-10-CM | POA: Diagnosis not present

## 2023-10-03 DIAGNOSIS — E7800 Pure hypercholesterolemia, unspecified: Secondary | ICD-10-CM | POA: Diagnosis not present

## 2023-10-03 DIAGNOSIS — I110 Hypertensive heart disease with heart failure: Secondary | ICD-10-CM | POA: Diagnosis not present

## 2023-10-03 DIAGNOSIS — I2699 Other pulmonary embolism without acute cor pulmonale: Secondary | ICD-10-CM | POA: Diagnosis not present

## 2023-10-03 DIAGNOSIS — I272 Pulmonary hypertension, unspecified: Secondary | ICD-10-CM | POA: Diagnosis not present

## 2023-10-03 DIAGNOSIS — E538 Deficiency of other specified B group vitamins: Secondary | ICD-10-CM | POA: Diagnosis not present

## 2023-10-03 DIAGNOSIS — M19011 Primary osteoarthritis, right shoulder: Secondary | ICD-10-CM | POA: Diagnosis not present

## 2023-10-03 DIAGNOSIS — J432 Centrilobular emphysema: Secondary | ICD-10-CM | POA: Diagnosis not present

## 2023-10-03 DIAGNOSIS — G43909 Migraine, unspecified, not intractable, without status migrainosus: Secondary | ICD-10-CM | POA: Diagnosis not present

## 2023-10-03 DIAGNOSIS — M19012 Primary osteoarthritis, left shoulder: Secondary | ICD-10-CM | POA: Diagnosis not present

## 2023-10-03 DIAGNOSIS — I739 Peripheral vascular disease, unspecified: Secondary | ICD-10-CM | POA: Diagnosis not present

## 2023-10-08 ENCOUNTER — Telehealth: Payer: Self-pay | Admitting: Adult Health

## 2023-10-08 NOTE — Telephone Encounter (Signed)
Pt's granddaughter rescheduled appointment due to no one to bring her to appointment

## 2023-10-09 ENCOUNTER — Ambulatory Visit: Payer: Medicare Other | Admitting: Adult Health

## 2023-10-09 DIAGNOSIS — E7800 Pure hypercholesterolemia, unspecified: Secondary | ICD-10-CM | POA: Diagnosis not present

## 2023-10-09 DIAGNOSIS — Z7901 Long term (current) use of anticoagulants: Secondary | ICD-10-CM | POA: Diagnosis not present

## 2023-10-09 DIAGNOSIS — I5022 Chronic systolic (congestive) heart failure: Secondary | ICD-10-CM | POA: Diagnosis not present

## 2023-10-09 DIAGNOSIS — I700 Atherosclerosis of aorta: Secondary | ICD-10-CM | POA: Diagnosis not present

## 2023-10-09 DIAGNOSIS — G301 Alzheimer's disease with late onset: Secondary | ICD-10-CM | POA: Diagnosis not present

## 2023-10-09 DIAGNOSIS — E538 Deficiency of other specified B group vitamins: Secondary | ICD-10-CM | POA: Diagnosis not present

## 2023-10-09 DIAGNOSIS — I739 Peripheral vascular disease, unspecified: Secondary | ICD-10-CM | POA: Diagnosis not present

## 2023-10-09 DIAGNOSIS — M19011 Primary osteoarthritis, right shoulder: Secondary | ICD-10-CM | POA: Diagnosis not present

## 2023-10-09 DIAGNOSIS — D509 Iron deficiency anemia, unspecified: Secondary | ICD-10-CM | POA: Diagnosis not present

## 2023-10-09 DIAGNOSIS — Z602 Problems related to living alone: Secondary | ICD-10-CM | POA: Diagnosis not present

## 2023-10-09 DIAGNOSIS — M19012 Primary osteoarthritis, left shoulder: Secondary | ICD-10-CM | POA: Diagnosis not present

## 2023-10-09 DIAGNOSIS — E440 Moderate protein-calorie malnutrition: Secondary | ICD-10-CM | POA: Diagnosis not present

## 2023-10-09 DIAGNOSIS — I110 Hypertensive heart disease with heart failure: Secondary | ICD-10-CM | POA: Diagnosis not present

## 2023-10-09 DIAGNOSIS — J432 Centrilobular emphysema: Secondary | ICD-10-CM | POA: Diagnosis not present

## 2023-10-09 DIAGNOSIS — D6869 Other thrombophilia: Secondary | ICD-10-CM | POA: Diagnosis not present

## 2023-10-09 DIAGNOSIS — M810 Age-related osteoporosis without current pathological fracture: Secondary | ICD-10-CM | POA: Diagnosis not present

## 2023-10-09 DIAGNOSIS — G43909 Migraine, unspecified, not intractable, without status migrainosus: Secondary | ICD-10-CM | POA: Diagnosis not present

## 2023-10-09 DIAGNOSIS — I272 Pulmonary hypertension, unspecified: Secondary | ICD-10-CM | POA: Diagnosis not present

## 2023-10-09 DIAGNOSIS — Z87891 Personal history of nicotine dependence: Secondary | ICD-10-CM | POA: Diagnosis not present

## 2023-10-09 DIAGNOSIS — I2699 Other pulmonary embolism without acute cor pulmonale: Secondary | ICD-10-CM | POA: Diagnosis not present

## 2023-10-09 DIAGNOSIS — Z7982 Long term (current) use of aspirin: Secondary | ICD-10-CM | POA: Diagnosis not present

## 2023-10-11 DIAGNOSIS — G301 Alzheimer's disease with late onset: Secondary | ICD-10-CM | POA: Diagnosis not present

## 2023-10-11 DIAGNOSIS — D509 Iron deficiency anemia, unspecified: Secondary | ICD-10-CM | POA: Diagnosis not present

## 2023-10-11 DIAGNOSIS — I272 Pulmonary hypertension, unspecified: Secondary | ICD-10-CM | POA: Diagnosis not present

## 2023-10-11 DIAGNOSIS — G43909 Migraine, unspecified, not intractable, without status migrainosus: Secondary | ICD-10-CM | POA: Diagnosis not present

## 2023-10-11 DIAGNOSIS — E7800 Pure hypercholesterolemia, unspecified: Secondary | ICD-10-CM | POA: Diagnosis not present

## 2023-10-11 DIAGNOSIS — J432 Centrilobular emphysema: Secondary | ICD-10-CM | POA: Diagnosis not present

## 2023-10-11 DIAGNOSIS — D6869 Other thrombophilia: Secondary | ICD-10-CM | POA: Diagnosis not present

## 2023-10-11 DIAGNOSIS — I5022 Chronic systolic (congestive) heart failure: Secondary | ICD-10-CM | POA: Diagnosis not present

## 2023-10-11 DIAGNOSIS — Z87891 Personal history of nicotine dependence: Secondary | ICD-10-CM | POA: Diagnosis not present

## 2023-10-11 DIAGNOSIS — Z7901 Long term (current) use of anticoagulants: Secondary | ICD-10-CM | POA: Diagnosis not present

## 2023-10-11 DIAGNOSIS — M19011 Primary osteoarthritis, right shoulder: Secondary | ICD-10-CM | POA: Diagnosis not present

## 2023-10-11 DIAGNOSIS — M19012 Primary osteoarthritis, left shoulder: Secondary | ICD-10-CM | POA: Diagnosis not present

## 2023-10-11 DIAGNOSIS — I110 Hypertensive heart disease with heart failure: Secondary | ICD-10-CM | POA: Diagnosis not present

## 2023-10-11 DIAGNOSIS — I2699 Other pulmonary embolism without acute cor pulmonale: Secondary | ICD-10-CM | POA: Diagnosis not present

## 2023-10-11 DIAGNOSIS — E538 Deficiency of other specified B group vitamins: Secondary | ICD-10-CM | POA: Diagnosis not present

## 2023-10-11 DIAGNOSIS — I739 Peripheral vascular disease, unspecified: Secondary | ICD-10-CM | POA: Diagnosis not present

## 2023-10-11 DIAGNOSIS — Z602 Problems related to living alone: Secondary | ICD-10-CM | POA: Diagnosis not present

## 2023-10-11 DIAGNOSIS — M810 Age-related osteoporosis without current pathological fracture: Secondary | ICD-10-CM | POA: Diagnosis not present

## 2023-10-11 DIAGNOSIS — Z7982 Long term (current) use of aspirin: Secondary | ICD-10-CM | POA: Diagnosis not present

## 2023-10-11 DIAGNOSIS — I700 Atherosclerosis of aorta: Secondary | ICD-10-CM | POA: Diagnosis not present

## 2023-10-11 DIAGNOSIS — E440 Moderate protein-calorie malnutrition: Secondary | ICD-10-CM | POA: Diagnosis not present

## 2023-10-18 DIAGNOSIS — J432 Centrilobular emphysema: Secondary | ICD-10-CM | POA: Diagnosis not present

## 2023-10-18 DIAGNOSIS — G301 Alzheimer's disease with late onset: Secondary | ICD-10-CM | POA: Diagnosis not present

## 2023-10-18 DIAGNOSIS — D509 Iron deficiency anemia, unspecified: Secondary | ICD-10-CM | POA: Diagnosis not present

## 2023-10-18 DIAGNOSIS — Z7982 Long term (current) use of aspirin: Secondary | ICD-10-CM | POA: Diagnosis not present

## 2023-10-18 DIAGNOSIS — G43909 Migraine, unspecified, not intractable, without status migrainosus: Secondary | ICD-10-CM | POA: Diagnosis not present

## 2023-10-18 DIAGNOSIS — I700 Atherosclerosis of aorta: Secondary | ICD-10-CM | POA: Diagnosis not present

## 2023-10-18 DIAGNOSIS — M810 Age-related osteoporosis without current pathological fracture: Secondary | ICD-10-CM | POA: Diagnosis not present

## 2023-10-18 DIAGNOSIS — I110 Hypertensive heart disease with heart failure: Secondary | ICD-10-CM | POA: Diagnosis not present

## 2023-10-18 DIAGNOSIS — I5022 Chronic systolic (congestive) heart failure: Secondary | ICD-10-CM | POA: Diagnosis not present

## 2023-10-18 DIAGNOSIS — M19011 Primary osteoarthritis, right shoulder: Secondary | ICD-10-CM | POA: Diagnosis not present

## 2023-10-18 DIAGNOSIS — I739 Peripheral vascular disease, unspecified: Secondary | ICD-10-CM | POA: Diagnosis not present

## 2023-10-18 DIAGNOSIS — Z7901 Long term (current) use of anticoagulants: Secondary | ICD-10-CM | POA: Diagnosis not present

## 2023-10-18 DIAGNOSIS — M19012 Primary osteoarthritis, left shoulder: Secondary | ICD-10-CM | POA: Diagnosis not present

## 2023-10-18 DIAGNOSIS — D6869 Other thrombophilia: Secondary | ICD-10-CM | POA: Diagnosis not present

## 2023-10-18 DIAGNOSIS — E7800 Pure hypercholesterolemia, unspecified: Secondary | ICD-10-CM | POA: Diagnosis not present

## 2023-10-18 DIAGNOSIS — E538 Deficiency of other specified B group vitamins: Secondary | ICD-10-CM | POA: Diagnosis not present

## 2023-10-18 DIAGNOSIS — I272 Pulmonary hypertension, unspecified: Secondary | ICD-10-CM | POA: Diagnosis not present

## 2023-10-18 DIAGNOSIS — Z602 Problems related to living alone: Secondary | ICD-10-CM | POA: Diagnosis not present

## 2023-10-18 DIAGNOSIS — Z87891 Personal history of nicotine dependence: Secondary | ICD-10-CM | POA: Diagnosis not present

## 2023-10-18 DIAGNOSIS — E440 Moderate protein-calorie malnutrition: Secondary | ICD-10-CM | POA: Diagnosis not present

## 2023-10-18 DIAGNOSIS — I2699 Other pulmonary embolism without acute cor pulmonale: Secondary | ICD-10-CM | POA: Diagnosis not present

## 2023-10-26 DIAGNOSIS — M19011 Primary osteoarthritis, right shoulder: Secondary | ICD-10-CM | POA: Diagnosis not present

## 2023-10-26 DIAGNOSIS — D509 Iron deficiency anemia, unspecified: Secondary | ICD-10-CM | POA: Diagnosis not present

## 2023-10-26 DIAGNOSIS — Z7982 Long term (current) use of aspirin: Secondary | ICD-10-CM | POA: Diagnosis not present

## 2023-10-26 DIAGNOSIS — Z87891 Personal history of nicotine dependence: Secondary | ICD-10-CM | POA: Diagnosis not present

## 2023-10-26 DIAGNOSIS — I700 Atherosclerosis of aorta: Secondary | ICD-10-CM | POA: Diagnosis not present

## 2023-10-26 DIAGNOSIS — E440 Moderate protein-calorie malnutrition: Secondary | ICD-10-CM | POA: Diagnosis not present

## 2023-10-26 DIAGNOSIS — M810 Age-related osteoporosis without current pathological fracture: Secondary | ICD-10-CM | POA: Diagnosis not present

## 2023-10-26 DIAGNOSIS — M19012 Primary osteoarthritis, left shoulder: Secondary | ICD-10-CM | POA: Diagnosis not present

## 2023-10-26 DIAGNOSIS — Z602 Problems related to living alone: Secondary | ICD-10-CM | POA: Diagnosis not present

## 2023-10-26 DIAGNOSIS — J432 Centrilobular emphysema: Secondary | ICD-10-CM | POA: Diagnosis not present

## 2023-10-26 DIAGNOSIS — E538 Deficiency of other specified B group vitamins: Secondary | ICD-10-CM | POA: Diagnosis not present

## 2023-10-26 DIAGNOSIS — I2699 Other pulmonary embolism without acute cor pulmonale: Secondary | ICD-10-CM | POA: Diagnosis not present

## 2023-10-26 DIAGNOSIS — I110 Hypertensive heart disease with heart failure: Secondary | ICD-10-CM | POA: Diagnosis not present

## 2023-10-26 DIAGNOSIS — G43909 Migraine, unspecified, not intractable, without status migrainosus: Secondary | ICD-10-CM | POA: Diagnosis not present

## 2023-10-26 DIAGNOSIS — I739 Peripheral vascular disease, unspecified: Secondary | ICD-10-CM | POA: Diagnosis not present

## 2023-10-26 DIAGNOSIS — I5022 Chronic systolic (congestive) heart failure: Secondary | ICD-10-CM | POA: Diagnosis not present

## 2023-10-26 DIAGNOSIS — E7800 Pure hypercholesterolemia, unspecified: Secondary | ICD-10-CM | POA: Diagnosis not present

## 2023-10-26 DIAGNOSIS — G301 Alzheimer's disease with late onset: Secondary | ICD-10-CM | POA: Diagnosis not present

## 2023-10-26 DIAGNOSIS — D6869 Other thrombophilia: Secondary | ICD-10-CM | POA: Diagnosis not present

## 2023-10-26 DIAGNOSIS — Z7901 Long term (current) use of anticoagulants: Secondary | ICD-10-CM | POA: Diagnosis not present

## 2023-10-26 DIAGNOSIS — I272 Pulmonary hypertension, unspecified: Secondary | ICD-10-CM | POA: Diagnosis not present

## 2023-10-30 DIAGNOSIS — M19012 Primary osteoarthritis, left shoulder: Secondary | ICD-10-CM | POA: Diagnosis not present

## 2023-10-30 DIAGNOSIS — I110 Hypertensive heart disease with heart failure: Secondary | ICD-10-CM | POA: Diagnosis not present

## 2023-10-30 DIAGNOSIS — I272 Pulmonary hypertension, unspecified: Secondary | ICD-10-CM | POA: Diagnosis not present

## 2023-10-30 DIAGNOSIS — M19011 Primary osteoarthritis, right shoulder: Secondary | ICD-10-CM | POA: Diagnosis not present

## 2023-10-30 DIAGNOSIS — I5022 Chronic systolic (congestive) heart failure: Secondary | ICD-10-CM | POA: Diagnosis not present

## 2023-10-30 DIAGNOSIS — I700 Atherosclerosis of aorta: Secondary | ICD-10-CM | POA: Diagnosis not present

## 2023-10-30 DIAGNOSIS — E440 Moderate protein-calorie malnutrition: Secondary | ICD-10-CM | POA: Diagnosis not present

## 2023-10-30 DIAGNOSIS — Z7901 Long term (current) use of anticoagulants: Secondary | ICD-10-CM | POA: Diagnosis not present

## 2023-10-30 DIAGNOSIS — G43909 Migraine, unspecified, not intractable, without status migrainosus: Secondary | ICD-10-CM | POA: Diagnosis not present

## 2023-10-30 DIAGNOSIS — G301 Alzheimer's disease with late onset: Secondary | ICD-10-CM | POA: Diagnosis not present

## 2023-10-30 DIAGNOSIS — I2699 Other pulmonary embolism without acute cor pulmonale: Secondary | ICD-10-CM | POA: Diagnosis not present

## 2023-10-30 DIAGNOSIS — Z87891 Personal history of nicotine dependence: Secondary | ICD-10-CM | POA: Diagnosis not present

## 2023-10-30 DIAGNOSIS — J432 Centrilobular emphysema: Secondary | ICD-10-CM | POA: Diagnosis not present

## 2023-10-30 DIAGNOSIS — E7800 Pure hypercholesterolemia, unspecified: Secondary | ICD-10-CM | POA: Diagnosis not present

## 2023-10-30 DIAGNOSIS — D509 Iron deficiency anemia, unspecified: Secondary | ICD-10-CM | POA: Diagnosis not present

## 2023-10-30 DIAGNOSIS — Z7982 Long term (current) use of aspirin: Secondary | ICD-10-CM | POA: Diagnosis not present

## 2023-10-30 DIAGNOSIS — E538 Deficiency of other specified B group vitamins: Secondary | ICD-10-CM | POA: Diagnosis not present

## 2023-10-30 DIAGNOSIS — D6869 Other thrombophilia: Secondary | ICD-10-CM | POA: Diagnosis not present

## 2023-10-30 DIAGNOSIS — M810 Age-related osteoporosis without current pathological fracture: Secondary | ICD-10-CM | POA: Diagnosis not present

## 2023-10-30 DIAGNOSIS — Z602 Problems related to living alone: Secondary | ICD-10-CM | POA: Diagnosis not present

## 2023-10-30 DIAGNOSIS — I739 Peripheral vascular disease, unspecified: Secondary | ICD-10-CM | POA: Diagnosis not present

## 2023-11-01 DIAGNOSIS — D509 Iron deficiency anemia, unspecified: Secondary | ICD-10-CM | POA: Diagnosis not present

## 2023-11-01 DIAGNOSIS — I700 Atherosclerosis of aorta: Secondary | ICD-10-CM | POA: Diagnosis not present

## 2023-11-01 DIAGNOSIS — Z7982 Long term (current) use of aspirin: Secondary | ICD-10-CM | POA: Diagnosis not present

## 2023-11-01 DIAGNOSIS — G43909 Migraine, unspecified, not intractable, without status migrainosus: Secondary | ICD-10-CM | POA: Diagnosis not present

## 2023-11-01 DIAGNOSIS — M810 Age-related osteoporosis without current pathological fracture: Secondary | ICD-10-CM | POA: Diagnosis not present

## 2023-11-01 DIAGNOSIS — E538 Deficiency of other specified B group vitamins: Secondary | ICD-10-CM | POA: Diagnosis not present

## 2023-11-01 DIAGNOSIS — G301 Alzheimer's disease with late onset: Secondary | ICD-10-CM | POA: Diagnosis not present

## 2023-11-01 DIAGNOSIS — D6869 Other thrombophilia: Secondary | ICD-10-CM | POA: Diagnosis not present

## 2023-11-01 DIAGNOSIS — Z7901 Long term (current) use of anticoagulants: Secondary | ICD-10-CM | POA: Diagnosis not present

## 2023-11-01 DIAGNOSIS — E440 Moderate protein-calorie malnutrition: Secondary | ICD-10-CM | POA: Diagnosis not present

## 2023-11-01 DIAGNOSIS — I272 Pulmonary hypertension, unspecified: Secondary | ICD-10-CM | POA: Diagnosis not present

## 2023-11-01 DIAGNOSIS — Z87891 Personal history of nicotine dependence: Secondary | ICD-10-CM | POA: Diagnosis not present

## 2023-11-01 DIAGNOSIS — I2699 Other pulmonary embolism without acute cor pulmonale: Secondary | ICD-10-CM | POA: Diagnosis not present

## 2023-11-01 DIAGNOSIS — E7800 Pure hypercholesterolemia, unspecified: Secondary | ICD-10-CM | POA: Diagnosis not present

## 2023-11-01 DIAGNOSIS — M19011 Primary osteoarthritis, right shoulder: Secondary | ICD-10-CM | POA: Diagnosis not present

## 2023-11-01 DIAGNOSIS — I739 Peripheral vascular disease, unspecified: Secondary | ICD-10-CM | POA: Diagnosis not present

## 2023-11-01 DIAGNOSIS — I5022 Chronic systolic (congestive) heart failure: Secondary | ICD-10-CM | POA: Diagnosis not present

## 2023-11-01 DIAGNOSIS — J432 Centrilobular emphysema: Secondary | ICD-10-CM | POA: Diagnosis not present

## 2023-11-01 DIAGNOSIS — Z602 Problems related to living alone: Secondary | ICD-10-CM | POA: Diagnosis not present

## 2023-11-01 DIAGNOSIS — M19012 Primary osteoarthritis, left shoulder: Secondary | ICD-10-CM | POA: Diagnosis not present

## 2023-11-01 DIAGNOSIS — I110 Hypertensive heart disease with heart failure: Secondary | ICD-10-CM | POA: Diagnosis not present

## 2023-11-02 ENCOUNTER — Other Ambulatory Visit (HOSPITAL_COMMUNITY): Payer: Self-pay | Admitting: Family Medicine

## 2023-11-02 DIAGNOSIS — I739 Peripheral vascular disease, unspecified: Secondary | ICD-10-CM

## 2023-11-15 DIAGNOSIS — E7800 Pure hypercholesterolemia, unspecified: Secondary | ICD-10-CM | POA: Diagnosis not present

## 2023-11-15 DIAGNOSIS — J432 Centrilobular emphysema: Secondary | ICD-10-CM | POA: Diagnosis not present

## 2023-11-15 DIAGNOSIS — I5022 Chronic systolic (congestive) heart failure: Secondary | ICD-10-CM | POA: Diagnosis not present

## 2023-11-15 DIAGNOSIS — Z87891 Personal history of nicotine dependence: Secondary | ICD-10-CM | POA: Diagnosis not present

## 2023-11-15 DIAGNOSIS — E538 Deficiency of other specified B group vitamins: Secondary | ICD-10-CM | POA: Diagnosis not present

## 2023-11-15 DIAGNOSIS — Z7901 Long term (current) use of anticoagulants: Secondary | ICD-10-CM | POA: Diagnosis not present

## 2023-11-15 DIAGNOSIS — I2699 Other pulmonary embolism without acute cor pulmonale: Secondary | ICD-10-CM | POA: Diagnosis not present

## 2023-11-15 DIAGNOSIS — M19011 Primary osteoarthritis, right shoulder: Secondary | ICD-10-CM | POA: Diagnosis not present

## 2023-11-15 DIAGNOSIS — E440 Moderate protein-calorie malnutrition: Secondary | ICD-10-CM | POA: Diagnosis not present

## 2023-11-15 DIAGNOSIS — I110 Hypertensive heart disease with heart failure: Secondary | ICD-10-CM | POA: Diagnosis not present

## 2023-11-15 DIAGNOSIS — I700 Atherosclerosis of aorta: Secondary | ICD-10-CM | POA: Diagnosis not present

## 2023-11-15 DIAGNOSIS — M19012 Primary osteoarthritis, left shoulder: Secondary | ICD-10-CM | POA: Diagnosis not present

## 2023-11-15 DIAGNOSIS — M810 Age-related osteoporosis without current pathological fracture: Secondary | ICD-10-CM | POA: Diagnosis not present

## 2023-11-15 DIAGNOSIS — G43909 Migraine, unspecified, not intractable, without status migrainosus: Secondary | ICD-10-CM | POA: Diagnosis not present

## 2023-11-15 DIAGNOSIS — D6869 Other thrombophilia: Secondary | ICD-10-CM | POA: Diagnosis not present

## 2023-11-15 DIAGNOSIS — Z7982 Long term (current) use of aspirin: Secondary | ICD-10-CM | POA: Diagnosis not present

## 2023-11-15 DIAGNOSIS — G301 Alzheimer's disease with late onset: Secondary | ICD-10-CM | POA: Diagnosis not present

## 2023-11-15 DIAGNOSIS — I272 Pulmonary hypertension, unspecified: Secondary | ICD-10-CM | POA: Diagnosis not present

## 2023-11-15 DIAGNOSIS — D509 Iron deficiency anemia, unspecified: Secondary | ICD-10-CM | POA: Diagnosis not present

## 2023-11-15 DIAGNOSIS — I739 Peripheral vascular disease, unspecified: Secondary | ICD-10-CM | POA: Diagnosis not present

## 2023-11-15 DIAGNOSIS — Z602 Problems related to living alone: Secondary | ICD-10-CM | POA: Diagnosis not present

## 2023-11-16 DIAGNOSIS — I110 Hypertensive heart disease with heart failure: Secondary | ICD-10-CM | POA: Diagnosis not present

## 2023-11-16 DIAGNOSIS — D509 Iron deficiency anemia, unspecified: Secondary | ICD-10-CM | POA: Diagnosis not present

## 2023-11-16 DIAGNOSIS — J432 Centrilobular emphysema: Secondary | ICD-10-CM | POA: Diagnosis not present

## 2023-11-16 DIAGNOSIS — D6869 Other thrombophilia: Secondary | ICD-10-CM | POA: Diagnosis not present

## 2023-11-16 DIAGNOSIS — M810 Age-related osteoporosis without current pathological fracture: Secondary | ICD-10-CM | POA: Diagnosis not present

## 2023-11-16 DIAGNOSIS — Z7901 Long term (current) use of anticoagulants: Secondary | ICD-10-CM | POA: Diagnosis not present

## 2023-11-16 DIAGNOSIS — I739 Peripheral vascular disease, unspecified: Secondary | ICD-10-CM | POA: Diagnosis not present

## 2023-11-16 DIAGNOSIS — I5022 Chronic systolic (congestive) heart failure: Secondary | ICD-10-CM | POA: Diagnosis not present

## 2023-11-16 DIAGNOSIS — I2699 Other pulmonary embolism without acute cor pulmonale: Secondary | ICD-10-CM | POA: Diagnosis not present

## 2023-11-16 DIAGNOSIS — M19012 Primary osteoarthritis, left shoulder: Secondary | ICD-10-CM | POA: Diagnosis not present

## 2023-11-16 DIAGNOSIS — Z602 Problems related to living alone: Secondary | ICD-10-CM | POA: Diagnosis not present

## 2023-11-16 DIAGNOSIS — M19011 Primary osteoarthritis, right shoulder: Secondary | ICD-10-CM | POA: Diagnosis not present

## 2023-11-16 DIAGNOSIS — I272 Pulmonary hypertension, unspecified: Secondary | ICD-10-CM | POA: Diagnosis not present

## 2023-11-16 DIAGNOSIS — E440 Moderate protein-calorie malnutrition: Secondary | ICD-10-CM | POA: Diagnosis not present

## 2023-11-16 DIAGNOSIS — Z87891 Personal history of nicotine dependence: Secondary | ICD-10-CM | POA: Diagnosis not present

## 2023-11-16 DIAGNOSIS — Z7982 Long term (current) use of aspirin: Secondary | ICD-10-CM | POA: Diagnosis not present

## 2023-11-16 DIAGNOSIS — I700 Atherosclerosis of aorta: Secondary | ICD-10-CM | POA: Diagnosis not present

## 2023-11-16 DIAGNOSIS — G43909 Migraine, unspecified, not intractable, without status migrainosus: Secondary | ICD-10-CM | POA: Diagnosis not present

## 2023-11-16 DIAGNOSIS — E7800 Pure hypercholesterolemia, unspecified: Secondary | ICD-10-CM | POA: Diagnosis not present

## 2023-11-16 DIAGNOSIS — E538 Deficiency of other specified B group vitamins: Secondary | ICD-10-CM | POA: Diagnosis not present

## 2023-11-16 DIAGNOSIS — G301 Alzheimer's disease with late onset: Secondary | ICD-10-CM | POA: Diagnosis not present

## 2023-11-19 NOTE — Progress Notes (Signed)
 [facility] [place]  ASSESSMENT / PLAN: Amanda [name] is a 89 y.o. female with atherosclerosis of native arteries of bilateral lower extremities causing intermittent claudication.  Patient counseled patients with asymptomatic peripheral arterial disease or claudication have a 1-2% risk of developing chronic limb threatening ischemia, but a 15-30% risk of mortality in the next 5 years. Intervention should only be considered for medically optimized patients with disabling symptoms.   Recommend the following which can slow the progression of atherosclerosis and reduce the risk of major adverse cardiac / limb events:  Complete cessation from all tobacco products. Blood glucose control with goal A1c < 7%. Blood pressure control with goal blood pressure < 140/90 mmHg. Lipid reduction therapy with goal LDL-C <100 mg/dL (<29 if symptomatic from PAD).  Aspirin  81mg  PO QD.  Atorvastatin 40-80mg  PO QD (or other high intensity statin therapy).  No new or worrisome symptoms.  She is overall doing well.  Follow-up with me in a year for surveillance.  CHIEF COMPLAINT: Cramping discomfort in the legs  HISTORY OF PRESENT ILLNESS: Amanda [name] is a 89 y.o. female referred to clinic for evaluation of incidental discovery of peripheral arterial disease on noninvasive testing for deep venous thrombosis.  Patient was admitted 1/12 to 12/02/2022 for PE.  She was started on anticoagulation.  Workup was initiated including a venous duplex, which discovered incidental peripheral arterial disease.  The patient is fairly active.  She does report some occasion type symptoms.  She enjoys gardening, and is still active and working in her garden.  She does not have ischemic rest pain.  She has no ulcers about her feet.  11/20/23: Doing well overall.  No complaints.  We reviewed her noninvasive testing in detail  Past Medical History:  Diagnosis Date   Depression    DVT (deep venous thrombosis)  (HCC)    Hyperlipidemia    Hypertension    Memory disorder    Osteoporosis    PVC (premature ventricular contraction)     Past Surgical History:  Procedure Laterality Date   PARTIAL HYSTERECTOMY      Family History  Problem Relation Age of Onset   Alzheimer's disease Mother    Lung cancer Neg Hx    Memory loss Neg Hx     Social History   Socioeconomic History   Marital status: Divorced    Spouse name: Not on file   Number of children: Not on file   Years of education: Not on file   Highest education level: Not on file  Occupational History   Not on file  Tobacco Use   Smoking status: Former    Current packs/day: 0.00    Types: Cigarettes    Start date: 10/29/1954    Quit date: 10/30/1971    Years since quitting: 52.0   Smokeless tobacco: Never   Tobacco comments:    smoked 3-4/day  Substance and Sexual Activity   Alcohol use: No   Drug use: No   Sexual activity: Not on file  Other Topics Concern   Not on file  Social History Narrative   Worked in mills.   Divorced 10    Lives alone   Son lives in town and two daughter lives out of town.       Social Drivers of Corporate Investment Banker Strain: Low Risk  (01/17/2023)   Overall Financial Resource Strain (CARDIA)    Difficulty of Paying Living Expenses: Not very hard  Food Insecurity: No Food Insecurity (  01/17/2023)   Hunger Vital Sign    Worried About Running Out of Food in the Last Year: Never true    Ran Out of Food in the Last Year: Never true  Transportation Needs: No Transportation Needs (01/17/2023)   PRAPARE - Administrator, Civil Service (Medical): No    Lack of Transportation (Non-Medical): No  Physical Activity: Not on file  Stress: Not on file  Social Connections: Unknown (03/29/2022)   [facility]   Social Network    Social Network: Not on file  Intimate Partner Violence: Not At Risk (12/01/2022)   Humiliation, Afraid, Rape, and Kick  questionnaire    Fear of Current or Ex-Partner: No    Emotionally Abused: No    Physically Abused: No    Sexually Abused: No    Allergies  Allergen Reactions   Memantine  Other (See Comments)    Current Outpatient Medications  Medication Sig Dispense Refill   acetaminophen  (TYLENOL ) 325 MG tablet Take 2 tablets (650 mg total) by mouth every 6 (six) hours as needed for mild pain, moderate pain, fever or headache (or Fever >/= 101).     albuterol  (PROVENTIL ) (2.5 MG/3ML) 0.083% nebulizer solution Take 3 mLs (2.5 mg total) by nebulization every 6 (six) hours as needed for wheezing or shortness of breath. 75 mL 5   arformoterol  (BROVANA ) 15 MCG/2ML NEBU Take 2 mLs (15 mcg total) by nebulization 2 (two) times daily. 120 mL 6   aspirin  EC 81 MG tablet Take 1 tablet (81 mg total) by mouth daily.     benzonatate  (TESSALON ) 100 MG capsule Take 1 capsule (100 mg total) by mouth 3 (three) times daily as needed for cough. 21 capsule 0   citalopram  (CELEXA ) 20 MG tablet Take 1 tablet (20 mg total) by mouth every morning. 30 tablet 0   cyanocobalamin  (VITAMIN B12) 1000 MCG tablet Take 1,000 mcg by mouth every morning.     donepezil  (ARICEPT ) 10 MG tablet Take 10 mg by mouth at bedtime.     latanoprost  (XALATAN ) 0.005 % ophthalmic solution Place 1 drop into both eyes every evening.     metoprolol  succinate (TOPROL -XL) 25 MG 24 hr tablet Take 1 tablet (25 mg total) by mouth daily. 30 tablet 0   Multiple Vitamins-Minerals (CENTRUM SILVER PO) Take 1 tablet by mouth every morning.     pantoprazole  (PROTONIX ) 40 MG tablet Take 1 tablet (40 mg total) by mouth 2 (two) times daily. 60 tablet 0   revefenacin  (YUPELRI ) 175 MCG/3ML nebulizer solution Take 3 mLs (175 mcg total) by nebulization daily. 90 mL 5   RIVAROXABAN  (XARELTO ) VTE STARTER PACK (15 & 20 MG) Follow package directions: Take one 15mg  tablet by mouth twice a day. On day 22, switch to one 20mg  tablet once a day. Take with food. 51 each 0   No  current facility-administered medications for this visit.    PHYSICAL EXAM There were no vitals filed for this visit.   Early woman in no acute distress Regular rate and rhythm Unlabored breathing No palpable pedal pulses    PERTINENT LABORATORY AND RADIOLOGIC DATA  Most recent CBC    Latest Ref Rng & Units 12/02/2022    7:34 AM 11/30/2022    7:31 PM 08/01/2021    3:47 AM  CBC  WBC 4.0 - 10.5 K/uL 4.1  4.7  3.6   Hemoglobin 12.0 - 15.0 g/dL 87.0  86.7  86.7   Hematocrit 36.0 - 46.0 %  39.6  40.4  39.9   Platelets 150 - 400 K/uL 250  311  244      Most recent CMP    Latest Ref Rng & Units 12/02/2022    7:34 AM 12/01/2022    6:15 PM 11/30/2022    7:31 PM  CMP  Glucose 70 - 99 mg/dL 99  899  87   BUN 8 - 23 mg/dL 11  13  18    Creatinine 0.44 - 1.00 mg/dL 9.34  9.27  9.08   Sodium 135 - 145 mmol/L 137  138  135   Potassium 3.5 - 5.1 mmol/L 3.9  3.8  3.1   Chloride 98 - 111 mmol/L 102  100  99   CO2 22 - 32 mmol/L 25  27  25    Calcium 8.9 - 10.3 mg/dL 9.0  9.3  9.3   Total Protein 6.5 - 8.1 g/dL 6.6   7.9   Total Bilirubin 0.3 - 1.2 mg/dL 0.3   0.7   Alkaline Phos 38 - 126 U/L 50   60   AST 15 - 41 U/L 20   25   ALT 0 - 44 U/L 15   16     Renal function CrCl cannot be calculated (Patient's most recent lab result is older than the maximum 21 days allowed.).  Hgb A1c MFr Bld (%)  Date Value  01/06/2015 6.1 (H)    LDL Cholesterol  Date Value Ref Range Status  01/06/2015 116 (H) 0 - 99 mg/dL Final    Comment:           Total Cholesterol/HDL:CHD Risk Coronary Heart Disease Risk Table                     Men   Women  1/2 Average Risk   3.4   3.3  Average Risk       5.0   4.4  2 X Average Risk   9.6   7.1  3 X Average Risk  23.4   11.0        Use the calculated Patient Ratio above and the CHD Risk Table to determine the patient's CHD Risk.        ATP III CLASSIFICATION (LDL):  <100     mg/dL   Optimal  899-870  mg/dL   Near or Above                     Optimal  130-159  mg/dL   Borderline  839-810  mg/dL   High  >809     mg/dL   Very High      +-------+-----------+-----------+------------+------------+  ABI/TBIToday's ABIToday's TBIPrevious ABIPrevious TBI  +-------+-----------+-----------+------------+------------+  Right 0.76       0.59       0.83        0.54          +-------+-----------+-----------+------------+------------+  Left  0.78       0.36       0.67        0.30          +-------+-----------+-----------+------------+------------+   Debby SAILOR. Magda, MD FACS [facility] Phone Number: [phone] 11/19/2023 2:42 PM   Total time spent on preparing this encounter including chart review, data review, collecting history, examining the patient, coordinating care for this established patient, 30 minutes  Portions of this report may have been transcribed using voice recognition software.  Every  effort has been made to ensure accuracy; however, inadvertent computerized transcription errors may still be present.

## 2023-11-20 ENCOUNTER — Ambulatory Visit: Payer: Medicare Other | Admitting: Vascular Surgery

## 2023-11-20 ENCOUNTER — Encounter: Payer: Self-pay | Admitting: Vascular Surgery

## 2023-11-20 ENCOUNTER — Ambulatory Visit (HOSPITAL_COMMUNITY)
Admission: RE | Admit: 2023-11-20 | Discharge: 2023-11-20 | Disposition: A | Discharge status: Home / Self Care | Payer: Medicare Other | Source: Ambulatory Visit | Attending: Cardiovascular Disease | Admitting: Cardiovascular Disease

## 2023-11-20 VITALS — BP 152/86 | HR 56 | Temp 97.9°F | Resp 20 | Ht 61.0 in | Wt 106.0 lb

## 2023-11-20 DIAGNOSIS — I739 Peripheral vascular disease, unspecified: Secondary | ICD-10-CM | POA: Diagnosis not present

## 2023-11-20 DIAGNOSIS — I70213 Atherosclerosis of native arteries of extremities with intermittent claudication, bilateral legs: Secondary | ICD-10-CM

## 2023-11-21 LAB — VAS US ABI WITH/WO TBI
Left ABI: 0.78
Right ABI: 0.76

## 2023-12-04 ENCOUNTER — Other Ambulatory Visit: Payer: Self-pay

## 2023-12-04 DIAGNOSIS — I70213 Atherosclerosis of native arteries of extremities with intermittent claudication, bilateral legs: Secondary | ICD-10-CM

## 2023-12-05 ENCOUNTER — Ambulatory Visit: Payer: Medicare Other | Admitting: Podiatry

## 2023-12-05 ENCOUNTER — Encounter: Payer: Self-pay | Admitting: Podiatry

## 2023-12-05 VITALS — Ht 61.0 in | Wt 106.0 lb

## 2023-12-05 DIAGNOSIS — Q828 Other specified congenital malformations of skin: Secondary | ICD-10-CM

## 2023-12-05 DIAGNOSIS — M79674 Pain in right toe(s): Secondary | ICD-10-CM | POA: Diagnosis not present

## 2023-12-05 DIAGNOSIS — B351 Tinea unguium: Secondary | ICD-10-CM | POA: Diagnosis not present

## 2023-12-05 DIAGNOSIS — M79675 Pain in left toe(s): Secondary | ICD-10-CM | POA: Diagnosis not present

## 2023-12-05 DIAGNOSIS — D689 Coagulation defect, unspecified: Secondary | ICD-10-CM

## 2023-12-05 DIAGNOSIS — I739 Peripheral vascular disease, unspecified: Secondary | ICD-10-CM

## 2023-12-13 NOTE — Progress Notes (Signed)
  Subjective:  Patient ID: Amanda [name], female    DOB: Nov 07, 1933,  MRN: [id number]  Amanda [name] presents to clinic today for at risk foot care. Patient has h/o PAD and painful porokeratotic lesion(s) of both feet and painful mycotic toenails that limit ambulation. Painful toenails interfere with ambulation. Aggravating factors include wearing enclosed shoe gear. Pain is relieved with periodic professional debridement. Painful porokeratotic lesions are aggravated when weightbearing with and without shoegear. Pain is relieved with periodic professional debridement.   She is accompanied by her granddaughter on today's visit.  Chief Complaint  Patient presents with   rfc    She is here for routine foot care, her PCP is Dr. Tracie Harrier, Last Ov was 08/2023.   New problem(s): None.   PCP is Aliene Beams, MD.  Allergies  Allergen Reactions   Memantine Other (See Comments)    Review of Systems: Negative except as noted in the HPI.  Objective: No changes noted in today's physical examination. There were no vitals filed for this visit. Amanda [name] is a pleasant 89 y.o. female thin build in NAD. AAO x 3.  Vascular Examination: CFT <3 seconds b/l LE. Diminished DP/PT pulses b/l LE. Pedal hair absent. No pain with calf compression b/l. Lower extremity skin temperature gradient warm to cool. No edema noted b/l LE. No ischemia or gangrene noted b/l LE. No cyanosis or clubbing noted b/l LE.  Dermatological Examination: Pedal skin with normal turgor, texture and tone b/l. No open wounds. No interdigital macerations b/l. Toenails 1-5 b/l thickened, discolored, dystrophic with subungual debris. There is pain on palpation to dorsal aspect of nailplates. Porokeratotic lesion(s) right heel, submet head 1 right foot, and submet head 5 left foot. No erythema, no edema, no drainage, no fluctuance.  Neurological Examination: Protective sensation intact with 10 gram monofilament b/l LE. Vibratory  sensation intact b/l LE.   Musculoskeletal Examination: Muscle strength 5/5 to all lower extremity muscle groups bilaterally. Hammertoe(s) noted to the 2-5 bilaterally.  Assessment/Plan: 1. Pain due to onychomycosis of toenails of both feet   2. Porokeratosis   3. Clotting disorder (HCC)     -Consent given for treatment as described below: -Examined patient. -Patient to continue soft, supportive shoe gear daily. -Toenails 1-5 b/l were debrided in length and girth with sterile nail nippers and dremel without iatrogenic bleeding.  -Corn(s) right fourth digit pared utilizing sharp debridement with sterile blade without complication or incident. Total number debrided=1. -Porokeratotic lesion(s) right heel, submet head 1 right foot, and submet head 5 left foot pared and enucleated with sterile currette without incident. Total number of lesions debrided=3. -Patient/POA to call should there be question/concern in the interim.   Return in about 3 months (around 03/04/2024).  Freddie Breech, DPM      [facility] LOCATION: [address] [place] [address]                   Office [phone]   [facility] LOCATION: [address] [place], [place] [address] Office [phone]

## 2023-12-21 DIAGNOSIS — J432 Centrilobular emphysema: Secondary | ICD-10-CM | POA: Diagnosis not present

## 2023-12-21 DIAGNOSIS — I10 Essential (primary) hypertension: Secondary | ICD-10-CM | POA: Diagnosis not present

## 2023-12-21 DIAGNOSIS — D649 Anemia, unspecified: Secondary | ICD-10-CM | POA: Diagnosis not present

## 2024-01-25 DIAGNOSIS — I10 Essential (primary) hypertension: Secondary | ICD-10-CM | POA: Diagnosis not present

## 2024-01-25 DIAGNOSIS — J432 Centrilobular emphysema: Secondary | ICD-10-CM | POA: Diagnosis not present

## 2024-01-25 DIAGNOSIS — K219 Gastro-esophageal reflux disease without esophagitis: Secondary | ICD-10-CM | POA: Diagnosis not present

## 2024-01-25 DIAGNOSIS — E7800 Pure hypercholesterolemia, unspecified: Secondary | ICD-10-CM | POA: Diagnosis not present

## 2024-01-25 DIAGNOSIS — Z0000 Encounter for general adult medical examination without abnormal findings: Secondary | ICD-10-CM | POA: Diagnosis not present

## 2024-01-25 DIAGNOSIS — I2699 Other pulmonary embolism without acute cor pulmonale: Secondary | ICD-10-CM | POA: Diagnosis not present

## 2024-01-25 DIAGNOSIS — I739 Peripheral vascular disease, unspecified: Secondary | ICD-10-CM | POA: Diagnosis not present

## 2024-01-25 DIAGNOSIS — D509 Iron deficiency anemia, unspecified: Secondary | ICD-10-CM | POA: Diagnosis not present

## 2024-01-25 DIAGNOSIS — E538 Deficiency of other specified B group vitamins: Secondary | ICD-10-CM | POA: Diagnosis not present

## 2024-01-25 DIAGNOSIS — M19011 Primary osteoarthritis, right shoulder: Secondary | ICD-10-CM | POA: Diagnosis not present

## 2024-03-26 ENCOUNTER — Ambulatory Visit: Payer: Medicare Other | Admitting: Podiatry

## 2024-03-26 ENCOUNTER — Encounter: Payer: Self-pay | Admitting: Podiatry

## 2024-03-26 VITALS — Ht 61.0 in | Wt 106.0 lb

## 2024-03-26 DIAGNOSIS — L84 Corns and callosities: Secondary | ICD-10-CM | POA: Diagnosis not present

## 2024-03-26 DIAGNOSIS — D689 Coagulation defect, unspecified: Secondary | ICD-10-CM

## 2024-03-26 DIAGNOSIS — Q828 Other specified congenital malformations of skin: Secondary | ICD-10-CM

## 2024-03-26 DIAGNOSIS — M79675 Pain in left toe(s): Secondary | ICD-10-CM | POA: Diagnosis not present

## 2024-03-26 DIAGNOSIS — I739 Peripheral vascular disease, unspecified: Secondary | ICD-10-CM

## 2024-03-26 DIAGNOSIS — M79674 Pain in right toe(s): Secondary | ICD-10-CM

## 2024-03-26 DIAGNOSIS — B351 Tinea unguium: Secondary | ICD-10-CM | POA: Diagnosis not present

## 2024-03-30 NOTE — Progress Notes (Signed)
  Subjective:  Patient ID: Amanda [name], female    DOB: May 15, 1933,  MRN: [id number]  Amanda [name] presents to clinic today for at risk foot care. Patient has h/o PAD and painful porokeratotic lesion(s) b/l feet and painful mycotic toenails that limit ambulation. Painful toenails interfere with ambulation. Aggravating factors include wearing enclosed shoe gear. Pain is relieved with periodic professional debridement. Painful porokeratotic lesions are aggravated when weightbearing with and without shoegear. Pain is relieved with periodic professional debridement.  Chief Complaint  Patient presents with   Nail Problem    Pt is here for [facility] PCP is Dr Barbar Bonus and LOV was in April.   New problem(s): None.   PCP is Dorena Gander, MD.  Allergies  Allergen Reactions   Memantine  Other (See Comments)    Review of Systems: Negative except as noted in the HPI.  Objective: No changes noted in today's physical examination. There were no vitals filed for this visit. Amanda [name] is a pleasant 89 y.o. female thin build in NAD. AAO x 3.  Vascular Examination: CFT <3 seconds b/l LE. Diminished DP/PT pulses b/l LE. Pedal hair absent. No pain with calf compression b/l. Lower extremity skin temperature gradient warm to cool. No edema noted b/l LE. No ischemia or gangrene noted b/l LE. No cyanosis or clubbing noted b/l LE.  Dermatological Examination: Pedal skin with normal turgor, texture and tone b/l. No open wounds. No interdigital macerations b/l. Toenails 1-5 b/l thickened, discolored, dystrophic with subungual debris. There is pain on palpation to dorsal aspect of nailplates.   Porokeratotic lesion(s) right heel, submet head 1 right foot, and submet head 5 left foot. No erythema, no edema, no drainage, no fluctuance.  Neurological Examination: Protective sensation intact with 10 gram monofilament b/l LE. Vibratory sensation intact b/l LE.   Musculoskeletal Examination: Muscle strength 5/5 to  all lower extremity muscle groups bilaterally. Hammertoe(s) noted to the 2-5 bilaterally.  Assessment/Plan: 1. Pain due to onychomycosis of toenails of both feet   2. Porokeratosis   3. Corns   4. Clotting disorder (HCC)   5. PAD (peripheral artery disease) (HCC)     -Patient's family member present. All questions/concerns addressed on today's visit. -Mycotic toenails 1-5 bilaterally were debrided in length and girth with sterile nail nippers and dremel without incident. -Corn(s) right fourth digit pared utilizing sterile scalpel blade without complication or incident. Total number debrided=1. -Porokeratotic lesion(s) right heel, submet head 1 right foot, and submet head 5 left foot pared and enucleated with sterile currette without incident. Total number of lesions debrided=3. -Patient/POA to call should there be question/concern in the interim.   Return in about 3 months (around 06/26/2024).  Amanda [name], DPM      [place] [place] LOCATION: [address] [place] [address]                   Office [phone]   [facility] LOCATION: [address] [place], [place] [address] Office [phone]

## 2024-04-11 DIAGNOSIS — M25551 Pain in right hip: Secondary | ICD-10-CM | POA: Diagnosis not present

## 2024-04-11 DIAGNOSIS — M25552 Pain in left hip: Secondary | ICD-10-CM | POA: Diagnosis not present

## 2024-04-11 DIAGNOSIS — R051 Acute cough: Secondary | ICD-10-CM | POA: Diagnosis not present

## 2024-04-25 DIAGNOSIS — M25569 Pain in unspecified knee: Secondary | ICD-10-CM | POA: Diagnosis not present

## 2024-04-25 DIAGNOSIS — M810 Age-related osteoporosis without current pathological fracture: Secondary | ICD-10-CM | POA: Diagnosis not present

## 2024-04-25 DIAGNOSIS — R42 Dizziness and giddiness: Secondary | ICD-10-CM | POA: Diagnosis not present

## 2024-04-25 DIAGNOSIS — I10 Essential (primary) hypertension: Secondary | ICD-10-CM | POA: Diagnosis not present

## 2024-04-26 ENCOUNTER — Encounter (HOSPITAL_COMMUNITY): Payer: Self-pay

## 2024-04-26 ENCOUNTER — Inpatient Hospital Stay (HOSPITAL_COMMUNITY)
Admission: EM | Admit: 2024-04-26 | Discharge: 2024-05-01 | DRG: 378 | Disposition: A | Discharge status: Home / Self Care | Attending: Internal Medicine | Admitting: Internal Medicine

## 2024-04-26 ENCOUNTER — Other Ambulatory Visit: Payer: Self-pay

## 2024-04-26 DIAGNOSIS — Z8701 Personal history of pneumonia (recurrent): Secondary | ICD-10-CM

## 2024-04-26 DIAGNOSIS — D649 Anemia, unspecified: Secondary | ICD-10-CM | POA: Diagnosis not present

## 2024-04-26 DIAGNOSIS — Z86718 Personal history of other venous thrombosis and embolism: Secondary | ICD-10-CM | POA: Diagnosis not present

## 2024-04-26 DIAGNOSIS — Z86711 Personal history of pulmonary embolism: Secondary | ICD-10-CM

## 2024-04-26 DIAGNOSIS — M4854XA Collapsed vertebra, not elsewhere classified, thoracic region, initial encounter for fracture: Secondary | ICD-10-CM | POA: Diagnosis present

## 2024-04-26 DIAGNOSIS — K922 Gastrointestinal hemorrhage, unspecified: Secondary | ICD-10-CM | POA: Diagnosis not present

## 2024-04-26 DIAGNOSIS — I808 Phlebitis and thrombophlebitis of other sites: Secondary | ICD-10-CM | POA: Diagnosis not present

## 2024-04-26 DIAGNOSIS — Z87891 Personal history of nicotine dependence: Secondary | ICD-10-CM

## 2024-04-26 DIAGNOSIS — Z90711 Acquired absence of uterus with remaining cervical stump: Secondary | ICD-10-CM

## 2024-04-26 DIAGNOSIS — J439 Emphysema, unspecified: Secondary | ICD-10-CM | POA: Diagnosis not present

## 2024-04-26 DIAGNOSIS — F32A Depression, unspecified: Secondary | ICD-10-CM | POA: Diagnosis not present

## 2024-04-26 DIAGNOSIS — M810 Age-related osteoporosis without current pathological fracture: Secondary | ICD-10-CM | POA: Diagnosis not present

## 2024-04-26 DIAGNOSIS — D62 Acute posthemorrhagic anemia: Secondary | ICD-10-CM | POA: Diagnosis not present

## 2024-04-26 DIAGNOSIS — R54 Age-related physical debility: Secondary | ICD-10-CM | POA: Diagnosis present

## 2024-04-26 DIAGNOSIS — R195 Other fecal abnormalities: Secondary | ICD-10-CM | POA: Diagnosis not present

## 2024-04-26 DIAGNOSIS — D509 Iron deficiency anemia, unspecified: Secondary | ICD-10-CM | POA: Diagnosis present

## 2024-04-26 DIAGNOSIS — E278 Other specified disorders of adrenal gland: Secondary | ICD-10-CM | POA: Diagnosis present

## 2024-04-26 DIAGNOSIS — Z6820 Body mass index (BMI) 20.0-20.9, adult: Secondary | ICD-10-CM

## 2024-04-26 DIAGNOSIS — R630 Anorexia: Secondary | ICD-10-CM | POA: Diagnosis present

## 2024-04-26 DIAGNOSIS — R531 Weakness: Secondary | ICD-10-CM | POA: Diagnosis not present

## 2024-04-26 DIAGNOSIS — I10 Essential (primary) hypertension: Secondary | ICD-10-CM | POA: Diagnosis not present

## 2024-04-26 DIAGNOSIS — F0390 Unspecified dementia without behavioral disturbance: Secondary | ICD-10-CM | POA: Diagnosis not present

## 2024-04-26 DIAGNOSIS — Z820 Family history of epilepsy and other diseases of the nervous system: Secondary | ICD-10-CM

## 2024-04-26 DIAGNOSIS — E785 Hyperlipidemia, unspecified: Secondary | ICD-10-CM | POA: Diagnosis present

## 2024-04-26 DIAGNOSIS — I739 Peripheral vascular disease, unspecified: Secondary | ICD-10-CM | POA: Diagnosis not present

## 2024-04-26 DIAGNOSIS — Z79899 Other long term (current) drug therapy: Secondary | ICD-10-CM

## 2024-04-26 DIAGNOSIS — R933 Abnormal findings on diagnostic imaging of other parts of digestive tract: Secondary | ICD-10-CM | POA: Diagnosis not present

## 2024-04-26 DIAGNOSIS — R9431 Abnormal electrocardiogram [ECG] [EKG]: Secondary | ICD-10-CM | POA: Diagnosis not present

## 2024-04-26 DIAGNOSIS — F02A0 Dementia in other diseases classified elsewhere, mild, without behavioral disturbance, psychotic disturbance, mood disturbance, and anxiety: Secondary | ICD-10-CM | POA: Diagnosis present

## 2024-04-26 DIAGNOSIS — Z860100 Personal history of colon polyps, unspecified: Secondary | ICD-10-CM | POA: Diagnosis not present

## 2024-04-26 DIAGNOSIS — K921 Melena: Secondary | ICD-10-CM | POA: Diagnosis not present

## 2024-04-26 DIAGNOSIS — J449 Chronic obstructive pulmonary disease, unspecified: Secondary | ICD-10-CM | POA: Diagnosis not present

## 2024-04-26 DIAGNOSIS — K3189 Other diseases of stomach and duodenum: Secondary | ICD-10-CM | POA: Diagnosis present

## 2024-04-26 DIAGNOSIS — G309 Alzheimer's disease, unspecified: Secondary | ICD-10-CM | POA: Diagnosis present

## 2024-04-26 DIAGNOSIS — K319 Disease of stomach and duodenum, unspecified: Secondary | ICD-10-CM | POA: Diagnosis not present

## 2024-04-26 DIAGNOSIS — Z7982 Long term (current) use of aspirin: Secondary | ICD-10-CM

## 2024-04-26 DIAGNOSIS — D490 Neoplasm of unspecified behavior of digestive system: Secondary | ICD-10-CM | POA: Diagnosis not present

## 2024-04-26 DIAGNOSIS — R634 Abnormal weight loss: Secondary | ICD-10-CM | POA: Diagnosis not present

## 2024-04-26 DIAGNOSIS — Z7901 Long term (current) use of anticoagulants: Secondary | ICD-10-CM | POA: Diagnosis not present

## 2024-04-26 DIAGNOSIS — Z888 Allergy status to other drugs, medicaments and biological substances status: Secondary | ICD-10-CM

## 2024-04-26 DIAGNOSIS — E279 Disorder of adrenal gland, unspecified: Secondary | ICD-10-CM | POA: Diagnosis not present

## 2024-04-26 LAB — CBC
HCT: 25.4 % — ABNORMAL LOW (ref 36.0–46.0)
Hemoglobin: 8.3 g/dL — ABNORMAL LOW (ref 12.0–15.0)
MCH: 29.2 pg (ref 26.0–34.0)
MCHC: 32.7 g/dL (ref 30.0–36.0)
MCV: 89.4 fL (ref 80.0–100.0)
Platelets: 321 10*3/uL (ref 150–400)
RBC: 2.84 MIL/uL — ABNORMAL LOW (ref 3.87–5.11)
RDW: 16.2 % — ABNORMAL HIGH (ref 11.5–15.5)
WBC: 9.4 10*3/uL (ref 4.0–10.5)
nRBC: 0.6 % — ABNORMAL HIGH (ref 0.0–0.2)

## 2024-04-26 LAB — CBC WITH DIFFERENTIAL/PLATELET
Abs Immature Granulocytes: 0.02 10*3/uL (ref 0.00–0.07)
Basophils Absolute: 0 10*3/uL (ref 0.0–0.1)
Basophils Relative: 0 %
Eosinophils Absolute: 0.1 10*3/uL (ref 0.0–0.5)
Eosinophils Relative: 1 %
HCT: 17.4 % — ABNORMAL LOW (ref 36.0–46.0)
Hemoglobin: 5.1 g/dL — CL (ref 12.0–15.0)
Immature Granulocytes: 0 %
Lymphocytes Relative: 17 %
Lymphs Abs: 1.1 10*3/uL (ref 0.7–4.0)
MCH: 27.3 pg (ref 26.0–34.0)
MCHC: 29.3 g/dL — ABNORMAL LOW (ref 30.0–36.0)
MCV: 93 fL (ref 80.0–100.0)
Monocytes Absolute: 0.6 10*3/uL (ref 0.1–1.0)
Monocytes Relative: 8 %
Neutro Abs: 5.1 10*3/uL (ref 1.7–7.7)
Neutrophils Relative %: 74 %
Platelets: 379 10*3/uL (ref 150–400)
RBC: 1.87 MIL/uL — ABNORMAL LOW (ref 3.87–5.11)
RDW: 19.4 % — ABNORMAL HIGH (ref 11.5–15.5)
WBC: 6.9 10*3/uL (ref 4.0–10.5)
nRBC: 0.9 % — ABNORMAL HIGH (ref 0.0–0.2)

## 2024-04-26 LAB — BASIC METABOLIC PANEL WITH GFR
Anion gap: 7 (ref 5–15)
BUN: 15 mg/dL (ref 8–23)
CO2: 26 mmol/L (ref 22–32)
Calcium: 8.7 mg/dL — ABNORMAL LOW (ref 8.9–10.3)
Chloride: 105 mmol/L (ref 98–111)
Creatinine, Ser: 0.78 mg/dL (ref 0.44–1.00)
GFR, Estimated: >60 mL/min (ref >60)
Glucose, Bld: 103 mg/dL — ABNORMAL HIGH (ref 70–99)
Potassium: 3.5 mmol/L (ref 3.5–5.1)
Sodium: 138 mmol/L (ref 135–145)

## 2024-04-26 LAB — RETICULOCYTES
Immature Retic Fract: 35.9 % — ABNORMAL HIGH (ref 2.3–15.9)
RBC.: 2.86 MIL/uL — ABNORMAL LOW (ref 3.87–5.11)
Retic Count, Absolute: 123.6 10*3/uL (ref 19.0–186.0)
Retic Ct Pct: 4.3 % — ABNORMAL HIGH (ref 0.4–3.1)

## 2024-04-26 LAB — ABO/RH: ABO/RH(D): AB POS

## 2024-04-26 LAB — FOLATE: Folate: 26.3 ng/mL (ref >5.9)

## 2024-04-26 LAB — FERRITIN: Ferritin: 5 ng/mL — ABNORMAL LOW (ref 11–307)

## 2024-04-26 LAB — IRON AND TIBC
Iron: 22 ug/dL — ABNORMAL LOW (ref 28–170)
Saturation Ratios: 6 % — ABNORMAL LOW (ref 10.4–31.8)
TIBC: 382 ug/dL (ref 250–450)
UIBC: 360 ug/dL

## 2024-04-26 LAB — PREPARE RBC (CROSSMATCH)

## 2024-04-26 LAB — VITAMIN B12: Vitamin B-12: 1429 pg/mL — ABNORMAL HIGH (ref 180–914)

## 2024-04-26 LAB — POC OCCULT BLOOD, ED: Fecal Occult Bld: POSITIVE — AB

## 2024-04-26 MED ORDER — LATANOPROST 0.005 % OP SOLN
1.0000 [drp] | Freq: Every evening | OPHTHALMIC | Status: DC
  Administered 2024-04-28 – 2024-04-30 (×3): 1 [drp] via OPHTHALMIC
  Filled 2024-04-26: qty 2.5

## 2024-04-26 MED ORDER — ACETAMINOPHEN 325 MG PO TABS
650.0000 mg | ORAL_TABLET | Freq: Four times a day (QID) | ORAL | Status: DC | PRN
  Administered 2024-04-29: 650 mg via ORAL
  Filled 2024-04-26: qty 2

## 2024-04-26 MED ORDER — CITALOPRAM HYDROBROMIDE 20 MG PO TABS
20.0000 mg | ORAL_TABLET | Freq: Every morning | ORAL | Status: DC
  Administered 2024-04-27 – 2024-05-01 (×5): 20 mg via ORAL
  Filled 2024-04-26 (×5): qty 1

## 2024-04-26 MED ORDER — VITAMIN B-12 1000 MCG PO TABS
1000.0000 ug | ORAL_TABLET | Freq: Every morning | ORAL | Status: DC
  Administered 2024-04-27 – 2024-05-01 (×5): 1000 ug via ORAL
  Filled 2024-04-26 (×5): qty 1

## 2024-04-26 MED ORDER — ONDANSETRON HCL 4 MG PO TABS: 4.0000 mg | ORAL_TABLET | Freq: Four times a day (QID) | ORAL | Status: DC | PRN

## 2024-04-26 MED ORDER — DONEPEZIL HCL 10 MG PO TABS
10.0000 mg | ORAL_TABLET | Freq: Every day | ORAL | Status: DC
  Administered 2024-04-26 – 2024-04-30 (×5): 10 mg via ORAL
  Filled 2024-04-26 (×5): qty 1

## 2024-04-26 MED ORDER — REVEFENACIN 175 MCG/3ML IN SOLN
175.0000 ug | Freq: Every day | RESPIRATORY_TRACT | Status: DC
  Administered 2024-04-29 – 2024-04-30 (×2): 175 ug via RESPIRATORY_TRACT
  Filled 2024-04-26 (×4): qty 3

## 2024-04-26 MED ORDER — ALBUTEROL SULFATE (2.5 MG/3ML) 0.083% IN NEBU
2.5000 mg | INHALATION_SOLUTION | Freq: Four times a day (QID) | RESPIRATORY_TRACT | Status: DC | PRN
  Administered 2024-04-27: 2.5 mg via RESPIRATORY_TRACT
  Filled 2024-04-26: qty 3

## 2024-04-26 MED ORDER — FERROUS SULFATE 325 (65 FE) MG PO TBEC: 325.0000 mg | DELAYED_RELEASE_TABLET | ORAL | Status: DC

## 2024-04-26 MED ORDER — PANTOPRAZOLE SODIUM 40 MG IV SOLR
40.0000 mg | Freq: Two times a day (BID) | INTRAVENOUS | Status: DC
  Administered 2024-04-26 – 2024-05-01 (×10): 40 mg via INTRAVENOUS
  Filled 2024-04-26 (×10): qty 10

## 2024-04-26 MED ORDER — SODIUM CHLORIDE 0.9% IV SOLUTION: Freq: Once | INTRAVENOUS | Status: DC

## 2024-04-26 MED ORDER — ARFORMOTEROL TARTRATE 15 MCG/2ML IN NEBU
15.0000 ug | INHALATION_SOLUTION | Freq: Two times a day (BID) | RESPIRATORY_TRACT | Status: DC
  Administered 2024-04-26 – 2024-04-30 (×8): 15 ug via RESPIRATORY_TRACT
  Filled 2024-04-26 (×8): qty 2

## 2024-04-26 MED ORDER — ONDANSETRON HCL 4 MG/2ML IJ SOLN
4.0000 mg | Freq: Four times a day (QID) | INTRAMUSCULAR | Status: DC | PRN
Start: 2024-04-26 — End: 2024-05-01

## 2024-04-26 NOTE — ED Notes (Signed)
 Rate of blood administration increased to 250 mL/hr

## 2024-04-26 NOTE — H&P (Signed)
 History and Physical    Amanda [name] CBJ:[id number] DOB: 1933-06-05 DOA: 04/26/2024  I have briefly reviewed the patient's prior medical records in [facility]  PCP: Dorena Gander, MD  Patient coming from: home  Chief Complaint: Weakness, increased confusion, dark stools  HPI: Amanda [name] is a 89 y.o. 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  Patient's daughter / POA (came from [place] area) is currently at bedside and tells me that she has noticed that the patient has been a lot weaker, unable to ambulate on her own, and has been having dark stools recently.  She also noticed increased confusion and her memory problems have worsened in the last few days.  Patient for me denies any chest pain, denies any abdominal pain, no nausea or vomiting.  She does complain of weakness and dizziness, and does tell me that her stools have been black recently.  Care and were evaluated by PCP, and on blood work she was revealed to have a hemoglobin of 5 and directed to come to the emergency room.  ED Course: In the emergency room she is afebrile, normotensive, satting well on room air.  Blood work reveals normal renal function, and a hemoglobin of 5.1.  Fecal occult was positive.  She was ordered to be transfused 2 units of packed red blood cells and we are asked to admit  Review of Systems: All systems reviewed, and apart from HPI, all negative  Past Medical History:  Diagnosis Date   Depression    DVT (deep venous thrombosis) (HCC)    Hyperlipidemia    Hypertension    Memory disorder    Osteoporosis    PVC (premature ventricular contraction)     Past Surgical History:  Procedure Laterality Date   PARTIAL HYSTERECTOMY       reports that she  quit smoking about 52 years ago. Her smoking use included cigarettes. She started smoking about 69 years ago. She has never used smokeless tobacco. She reports that she does not drink alcohol and does not use drugs.  Allergies  Allergen Reactions   Memantine  Other (See Comments)    Family History  Problem Relation Age of Onset   Alzheimer's disease Mother    Lung cancer Neg Hx    Memory loss Neg Hx     Prior to Admission medications   Medication Sig Start Date End Date Taking? Authorizing Provider  acetaminophen  (TYLENOL ) 325 MG tablet Take 2 tablets (650 mg total) by mouth every 6 (six) hours as needed for mild pain, moderate pain, fever or headache (or Fever >/= 101). 12/02/22   Kraig Peru, MD  albuterol  (PROVENTIL ) (2.5 MG/3ML) 0.083% nebulizer solution Take 3 mLs (2.5 mg total) by nebulization every 6 (six) hours as needed for wheezing or shortness of breath. 10/11/21   Desai, Nikita S, MD  arformoterol  (BROVANA ) 15 MCG/2ML NEBU Take 2 mLs (15 mcg total) by nebulization 2 (two) times daily. 04/02/23   Aleck Hurdle, MD  aspirin  EC 81 MG tablet Take 1 tablet (81 mg total) by mouth daily. 12/24/22   Kraig Peru, MD  benzonatate  (TESSALON ) 100 MG capsule Take 1 capsule (100 mg total) by mouth 3 (three) times daily as needed for cough. 12/29/22   Rising, Ivette Marks, PA-C  citalopram  (CELEXA ) 20 MG tablet Take 1 tablet (  20 mg total) by mouth every morning. 12/02/22   Kraig Peru, MD  cyanocobalamin  (VITAMIN B12) 1000 MCG tablet Take 1,000 mcg by mouth every morning.    [provider]  donepezil  (ARICEPT ) 10 MG tablet Take 10 mg by mouth at bedtime.    [provider]  latanoprost  (XALATAN ) 0.005 % ophthalmic solution Place 1 drop into both eyes every evening.    [provider]  metoprolol  succinate (TOPROL -XL) 25 MG 24 hr tablet Take 1 tablet (25 mg total) by mouth daily. 12/03/22   Patel, Pranav M, MD  Multiple Vitamins-Minerals (CENTRUM SILVER PO) Take 1  tablet by mouth every morning.    [provider]  pantoprazole  (PROTONIX ) 40 MG tablet Take 1 tablet (40 mg total) by mouth 2 (two) times daily. 12/02/22   Kraig Peru, MD  revefenacin  (YUPELRI ) 175 MCG/3ML nebulizer solution Take 3 mLs (175 mcg total) by nebulization daily. 04/02/23   Aleck Hurdle, MD  RIVAROXABAN  (XARELTO ) VTE STARTER PACK (15 & 20 MG) Follow package directions: Take one 15mg  tablet by mouth twice a day. On day 22, switch to one 20mg  tablet once a day. Take with food. 12/02/22   Kraig Peru, MD    Physical Exam: Vitals:   04/26/24 0801 04/26/24 1610 04/26/24 0821 04/26/24 1100  BP: 115/67 (!) 128/103  (!) 146/55  Pulse: 67 88  (!) 58  Resp: 17 18  17   Temp: 98.2 F (36.8 C) 97.9 F (36.6 C)  97.7 F (36.5 C)  TempSrc:    Oral  SpO2:  97%  100%  Weight:   48.1 kg   Height:   5' (1.524 m)    Constitutional: NAD, calm, comfortable Eyes: PERRL, lids and conjunctivae normal ENMT: Mucous membranes are moist.  Neck: normal, supple Respiratory: clear to auscultation bilaterally, no wheezing, no crackles. Normal respiratory effort.  Cardiovascular: Regular rate and rhythm, no murmurs / rubs / gallops.  Trace edema Abdomen: no tenderness, no masses palpated. Bowel sounds positive.  Musculoskeletal: no clubbing / cyanosis. Normal muscle tone.  Skin: no rashes, lesions, ulcers. No induration Neurologic: Grossly nonfocal  Labs on Admission: I have personally reviewed following labs and imaging studies  CBC: Recent Labs  Lab 04/26/24 0827  WBC 6.9  NEUTROABS 5.1  HGB 5.1*  HCT 17.4*  MCV 93.0  PLT 379   Basic Metabolic Panel: Recent Labs  Lab 04/26/24 0827  NA 138  K 3.5  CL 105  CO2 26  GLUCOSE 103*  BUN 15  CREATININE 0.78  CALCIUM 8.7*   Liver Function Tests: No results for input(s): "AST", "ALT", "ALKPHOS", "BILITOT", "PROT", "ALBUMIN" in the last 168 hours. Coagulation Profile: No results for input(s): "INR", "PROTIME" in the  last 168 hours. BNP (last 3 results) No results for input(s): "PROBNP" in the last 8760 hours. CBG: No results for input(s): "GLUCAP" in the last 168 hours. Thyroid  Function Tests: No results for input(s): "TSH", "T4TOTAL", "FREET4", "T3FREE", "THYROIDAB" in the last 72 hours. Urine analysis:    Component Value Date/Time   COLORURINE YELLOW 11/30/2022 2208   APPEARANCEUR HAZY (A) 11/30/2022 2208   LABSPEC >=1.030 11/30/2022 2208   PHURINE 5.5 11/30/2022 2208   GLUCOSEU NEGATIVE 11/30/2022 2208   HGBUR NEGATIVE 11/30/2022 2208   BILIRUBINUR SMALL (A) 11/30/2022 2208   BILIRUBINUR n 12/28/2015 1438   KETONESUR NEGATIVE 11/30/2022 2208   PROTEINUR NEGATIVE 11/30/2022 2208   UROBILINOGEN 0.2 12/28/2015 1438   UROBILINOGEN 0.2 08/22/2013 1525  NITRITE NEGATIVE 11/30/2022 2208   LEUKOCYTESUR SMALL (A) 11/30/2022 2208   EKG: Independently reviewed. Sinus rhythm on monitor  Assessment/Plan Principal problem Symptomatic anemia with concern for upper GI bleed, melena -patient will be admitted to the hospital, she will be transfused 2 unit of packed red blood cells.  Gastroenterology consult was requested.  In chart review, she underwent a CT scan in October 2024 which showed a thickened appearance of the distal stomach  - Closely monitor CBC - IV PPI for now  Active problems History of DVT/PE-hold anticoagulation for now  Underlying mild dementia-resume home medications   Essential hypertension-hold metoprolol  with concern for bleed and heart rate into the 50s, also daughter reported that she had low blood pressure at home  PAD-noted, no current symptoms   DVT prophylaxis: SCDs  Code Status: Full code   Family Communication: daughter at bedside Disposition Plan: home when ready  Bed Type: tele med Consults called: GI  Obs/Inp: Obs  Kathlen Para, MD, PhD [facility]  Contact via [email or web address]  04/26/2024, 11:13 AM

## 2024-04-26 NOTE — Consult Note (Signed)
 [facility] Consultation Note  Referring Provider: [facility] Primary Care Physician:  Dorena Gander, MD Primary Gastroenterologist:  Dr.Schooler  Reason for Consultation:  anemia, weakness  HPI: Amanda [name] is a 89 y.o. female presenting weakness, dizziness, dark stools (on iron 2/week).  Hgb 5.1, on rivaroxaban  (last dose yesterday evening).  CT last Fall with gastric thickening otherwise no obvious luminal tumors/lesions.  Endoscopy was discussed at that time per family, but given her age was not done.  Last colonoscopy 2011 with removal of a couple adenomatous polyps.  Has had poor appetite and ill-described weight loss over the past several months.   Past Medical History:  Diagnosis Date   Depression    DVT (deep venous thrombosis) (HCC)    Hyperlipidemia    Hypertension    Memory disorder    Osteoporosis    PVC (premature ventricular contraction)     Past Surgical History:  Procedure Laterality Date   PARTIAL HYSTERECTOMY      Prior to Admission medications   Medication Sig Start Date End Date Taking? Authorizing Provider  acetaminophen  (TYLENOL ) 325 MG tablet Take 2 tablets (650 mg total) by mouth every 6 (six) hours as needed for mild pain, moderate pain, fever or headache (or Fever >/= 101). 12/02/22  Yes Kraig Peru, MD  albuterol  (PROVENTIL ) (2.5 MG/3ML) 0.083% nebulizer solution Take 3 mLs (2.5 mg total) by nebulization every 6 (six) hours as needed for wheezing or shortness of breath. 10/11/21  Yes Aleck Hurdle, MD  arformoterol  (BROVANA ) 15 MCG/2ML NEBU Take 2 mLs (15 mcg total) by nebulization 2 (two) times daily. 04/02/23  Yes Aleck Hurdle, MD  aspirin  EC 81 MG tablet Take 1 tablet (81 mg total) by mouth daily. 12/24/22  Yes Kraig Peru, MD  citalopram  (CELEXA ) 20 MG tablet Take 1 tablet (20 mg total) by mouth every morning. 12/02/22  Yes Kraig Peru, MD  cyanocobalamin  (VITAMIN B12) 1000 MCG tablet Take 1,000 mcg by mouth every  morning.   Yes [provider]  donepezil  (ARICEPT ) 10 MG tablet Take 10 mg by mouth at bedtime.   Yes [provider]  famotidine (PEPCID) 20 MG tablet Take 20 mg by mouth daily. 09/17/23  Yes [provider]  ferrous sulfate  325 (65 FE) MG EC tablet Take 325 mg by mouth 2 (two) times a week.   Yes [provider]  latanoprost  (XALATAN ) 0.005 % ophthalmic solution Place 1 drop into both eyes every evening.   Yes [provider]  metoprolol  succinate (TOPROL -XL) 25 MG 24 hr tablet Take 1 tablet (25 mg total) by mouth daily. 12/03/22  Yes Patel, Pranav M, MD  Multiple Vitamins-Minerals (CENTRUM SILVER PO) Take 1 tablet by mouth every morning.   Yes [provider]  pantoprazole  (PROTONIX ) 40 MG tablet Take 1 tablet (40 mg total) by mouth 2 (two) times daily. 12/02/22  Yes Kraig Peru, MD  RIVAROXABAN  (XARELTO ) VTE STARTER PACK (15 & 20 MG) Follow package directions: Take one 15mg  tablet by mouth twice a day. On day 22, switch to one 20mg  tablet once a day. Take with food. 12/02/22  Yes Kraig Peru, MD  revefenacin  (YUPELRI ) 175 MCG/3ML nebulizer solution Take 3 mLs (175 mcg total) by nebulization daily. 04/02/23   Aleck Hurdle, MD    Current Facility-Administered Medications  Medication Dose Route Frequency Provider Last Rate Last Admin   0.9 %  sodium chloride  infusion (Manually program via Guardrails IV Fluids)   Intravenous  Once Trish Furl, MD       acetaminophen  (TYLENOL ) tablet 650 mg  650 mg Oral Q6H PRN Gherghe, Costin M, MD       albuterol  (PROVENTIL ) (2.5 MG/3ML) 0.083% nebulizer solution 2.5 mg  2.5 mg Nebulization Q6H PRN Gherghe, Costin M, MD       arformoterol  (BROVANA ) nebulizer solution 15 mcg  15 mcg Nebulization BID Gherghe, Costin M, MD       citalopram  (CELEXA ) tablet 20 mg  20 mg Oral q morning Gherghe, Costin M, MD       cyanocobalamin  (VITAMIN B12) tablet 1,000 mcg  1,000 mcg Oral q morning Gherghe, Costin M, MD        donepezil  (ARICEPT ) tablet 10 mg  10 mg Oral QHS Gherghe, Costin M, MD       [START ON 04/28/2024] ferrous sulfate  EC tablet 325 mg  325 mg Oral Once per day on Monday Thursday Osborn Blaze, MD       latanoprost  (XALATAN ) 0.005 % ophthalmic solution 1 drop  1 drop Both Eyes QPM Gherghe, Bethene Brought, MD       ondansetron  (ZOFRAN ) tablet 4 mg  4 mg Oral Q6H PRN Gherghe, Costin M, MD       Or   ondansetron  (ZOFRAN ) injection 4 mg  4 mg Intravenous Q6H PRN Gherghe, Costin M, MD       pantoprazole  (PROTONIX ) injection 40 mg  40 mg Intravenous Q12H Osborn Blaze, MD       revefenacin  (YUPELRI ) nebulizer solution 175 mcg  175 mcg Nebulization Daily Gherghe, Costin M, MD        Allergies as of 04/26/2024 - Review Complete 04/26/2024  Allergen Reaction Noted   Memantine  Other (See Comments) 08/16/2023    Family History  Problem Relation Age of Onset   Alzheimer's disease Mother    Lung cancer Neg Hx    Memory loss Neg Hx     Social History   Socioeconomic History   Marital status: Divorced    Spouse name: Not on file   Number of children: Not on file   Years of education: Not on file   Highest education level: Not on file  Occupational History   Not on file  Tobacco Use   Smoking status: Former    Current packs/day: 0.00    Types: Cigarettes    Start date: 10/29/1954    Quit date: 10/30/1971    Years since quitting: 52.5   Smokeless tobacco: Never   Tobacco comments:    smoked 3-4/day  Substance and Sexual Activity   Alcohol use: No   Drug use: No   Sexual activity: Not on file  Other Topics Concern   Not on file  Social History Narrative   Worked in mills.   Divorced 10    Lives alone   Son lives in town and two daughter lives out of town.       Social Drivers of Corporate investment banker Strain: Low Risk  (01/17/2023)   Overall Financial Resource Strain (CARDIA)    Difficulty of Paying Living Expenses: Not very hard  Food Insecurity: No Food Insecurity  (01/17/2023)   Hunger Vital Sign    Worried About Running Out of Food in the Last Year: Never true    Ran Out of Food in the Last Year: Never true  Transportation Needs: No Transportation Needs (01/17/2023)   PRAPARE - Administrator, Civil Service (Medical): No  Lack of Transportation (Non-Medical): No  Physical Activity: Not on file  Stress: Not on file  Social Connections: Unknown (03/29/2022)   Received from [facility]   Social Network    Social Network: Not on file  Intimate Partner Violence: Not At Risk (12/01/2022)   Humiliation, Afraid, Rape, and Kick questionnaire    Fear of Current or Ex-Partner: No    Emotionally Abused: No    Physically Abused: No    Sexually Abused: No    Review of Systems: As per HPI, all others negative.  Physical Exam: Vital signs in last 24 hours: Temp:  [97.4 F (36.3 C)-98.2 F (36.8 C)] 97.7 F (36.5 C) (06/07 1212) Pulse Rate:  [55-88] 55 (06/07 1212) Resp:  [17-19] 19 (06/07 1212) BP: (115-166)/(55-103) 166/68 (06/07 1212) SpO2:  [86 %-100 %] 86 % (06/07 1212) Weight:  [48.1 kg] 48.1 kg (06/07 0821)   General:   Alert,  elderly, thin, NAD Head:  Normocephalic and atraumatic. Eyes:  Sclera clear, no icterus.   Conjunctiva pale Ears:  Normal auditory acuity. Nose:  No deformity, discharge,  or lesions. Mouth:  No deformity or lesions.  Oropharynx pale and dry Neck:  Supple; no masses or thyromegaly. Lungs:  No visible respiratory distress Abdomen:  Soft, nontender and nondistended. No masses, hepatosplenomegaly or hernias noted. No peritonitis Msk:  Symmetrical without gross deformities. Normal posture. Pulses:  Normal pulses noted. Extremities:  Without clubbing or edema. Neurologic:  Alert and  oriented x4; diffusely weak, otherwise grossly normal neurologically. Skin:  Intact without significant lesions or rashes. Psych:  Alert and cooperative. Normal mood and affect.   Lab Results: Recent Labs     04/26/24 0827  WBC 6.9  HGB 5.1*  HCT 17.4*  PLT 379   BMET Recent Labs    04/26/24 0827  NA 138  K 3.5  CL 105  CO2 26  GLUCOSE 103*  BUN 15  CREATININE 0.78  CALCIUM 8.7*   LFT No results for input(s): "PROT", "ALBUMIN", "AST", "ALT", "ALKPHOS", "BILITOT", "BILIDIR", "IBILI" in the last 72 hours. PT/INR No results for input(s): "LABPROT", "INR" in the last 72 hours.  Studies/Results: No results found.  Impression:   Symptomatic anemia (weakness). Dark stools (on iron). Chronic anticoagulation, rivaroxaban , last dose last night. Weight loss. Abnormal CT (stomach thickening). Recurrent DVT, PE.  Plan:   Over the past months, there has been discussion about whether or not to pursue endoscoy for the stomach thickening seen on CT last Fall. At this point, with recurrent and profound anemia, and need for ongoing anticoagulation, and in light of CT findings, I think the benefits of endoscopy outweigh the risks and as such I would recommend endoscopy.  Family is on board with this plan, but patient as of yet is uncertain. Hold rivaroxaban . Follow CBCs; transfuse as needed. Soft diet ok for now. [facility] will follow; if patient agreeable to endoscopy when I go to see her tomorrow, we can set up EGD for Monday.   LOS: 0 days   Shahid Flori M  04/26/2024, 1:44 PM  Cell [phone] If no answer or after 5 PM call [phone]

## 2024-04-26 NOTE — Plan of Care (Signed)

## 2024-04-26 NOTE — ED Triage Notes (Signed)
 Pt arrived POV from home stating she saw her PCP yesterday and had routine blood work done and was told this morning her hemoglobin was low and she needed to come in for a transfusion. Per the patient she denies any pain, SHOB or fatigue. Pt is currently asymptomatic.

## 2024-04-26 NOTE — ED Provider Notes (Signed)
 [name] Provider Note   CSN: [id number] Arrival date & time: 04/26/24  0753     History  Chief Complaint  Patient presents with   Abnormal Lab    Amanda [name] is a 89 y.o. female.   Abnormal Lab    Pt was following up with her doctor after a recent diagnosis of pneumonia that was treated with outpt abx.  Pt was feeling weak yesterday still.  SHe was having some aches in her knees.  SHe seemed pale and her blood pressure was running low. NO blood in the stool but she has had dark stools.  SHe does take iron tablets though.  Patient also also on Xarelto  and her last dose was last evening  Home Medications Prior to Admission medications   Medication Sig Start Date End Date Taking? Authorizing Provider  acetaminophen  (TYLENOL ) 325 MG tablet Take 2 tablets (650 mg total) by mouth every 6 (six) hours as needed for mild pain, moderate pain, fever or headache (or Fever >/= 101). 12/02/22   Kraig Peru, MD  albuterol  (PROVENTIL ) (2.5 MG/3ML) 0.083% nebulizer solution Take 3 mLs (2.5 mg total) by nebulization every 6 (six) hours as needed for wheezing or shortness of breath. 10/11/21   Desai, Nikita S, MD  arformoterol  (BROVANA ) 15 MCG/2ML NEBU Take 2 mLs (15 mcg total) by nebulization 2 (two) times daily. 04/02/23   Aleck Hurdle, MD  aspirin  EC 81 MG tablet Take 1 tablet (81 mg total) by mouth daily. 12/24/22   Kraig Peru, MD  benzonatate  (TESSALON ) 100 MG capsule Take 1 capsule (100 mg total) by mouth 3 (three) times daily as needed for cough. 12/29/22   Rising, Ivette Marks, PA-C  citalopram  (CELEXA ) 20 MG tablet Take 1 tablet (20 mg total) by mouth every morning. 12/02/22   Kraig Peru, MD  cyanocobalamin  (VITAMIN B12) 1000 MCG tablet Take 1,000 mcg by mouth every morning.    [provider]  donepezil  (ARICEPT ) 10 MG tablet Take 10 mg by mouth at bedtime.    [provider]  latanoprost  (XALATAN ) 0.005 % ophthalmic  solution Place 1 drop into both eyes every evening.    [provider]  metoprolol  succinate (TOPROL -XL) 25 MG 24 hr tablet Take 1 tablet (25 mg total) by mouth daily. 12/03/22   Patel, Pranav M, MD  Multiple Vitamins-Minerals (CENTRUM SILVER PO) Take 1 tablet by mouth every morning.    [provider]  pantoprazole  (PROTONIX ) 40 MG tablet Take 1 tablet (40 mg total) by mouth 2 (two) times daily. 12/02/22   Kraig Peru, MD  revefenacin  (YUPELRI ) 175 MCG/3ML nebulizer solution Take 3 mLs (175 mcg total) by nebulization daily. 04/02/23   Aleck Hurdle, MD  RIVAROXABAN  (XARELTO ) VTE STARTER PACK (15 & 20 MG) Follow package directions: Take one 15mg  tablet by mouth twice a day. On day 22, switch to one 20mg  tablet once a day. Take with food. 12/02/22   Kraig Peru, MD      Allergies    Memantine     Review of Systems   Review of Systems  Physical Exam Updated Vital Signs BP (!) 128/103   Pulse 88   Temp 97.9 F (36.6 C)   Resp 18   Ht 1.524 m (5')   Wt 48.1 kg   SpO2 97%   BMI 20.70 kg/m  Physical Exam Vitals and nursing note reviewed.  Constitutional:      General: She is  not in acute distress.    Appearance: She is well-developed.  HENT:     Head: Normocephalic and atraumatic.     Right Ear: External ear normal.     Left Ear: External ear normal.  Eyes:     General: No scleral icterus.       Right eye: No discharge.        Left eye: No discharge.     Conjunctiva/sclera: Conjunctivae normal.  Neck:     Trachea: No tracheal deviation.  Cardiovascular:     Rate and Rhythm: Normal rate and regular rhythm.  Pulmonary:     Effort: Pulmonary effort is normal. No respiratory distress.     Breath sounds: Normal breath sounds. No stridor. No wheezing or rales.  Abdominal:     General: Bowel sounds are normal. There is no distension.     Palpations: Abdomen is soft.     Tenderness: There is no abdominal tenderness. There is no guarding or rebound.   Genitourinary:    Comments: Dark black stool noted on rectal exam, no gross blood Musculoskeletal:        General: No tenderness or deformity.     Cervical back: Neck supple.  Skin:    General: Skin is warm and dry.     Findings: No rash.  Neurological:     General: No focal deficit present.     Mental Status: She is alert.     Cranial Nerves: No cranial nerve deficit, dysarthria or facial asymmetry.     Sensory: No sensory deficit.     Motor: No abnormal muscle tone or seizure activity.     Coordination: Coordination normal.  Psychiatric:        Mood and Affect: Mood normal.     ED Results / Procedures / Treatments   Labs (all labs ordered are listed, but only abnormal results are displayed) Labs Reviewed  CBC WITH DIFFERENTIAL/PLATELET - Abnormal; Notable for the following components:      Result Value   RBC 1.87 (*)    Hemoglobin 5.1 (*)    HCT 17.4 (*)    MCHC 29.3 (*)    RDW 19.4 (*)    nRBC 0.9 (*)    All other components within normal limits  BASIC METABOLIC PANEL WITH GFR - Abnormal; Notable for the following components:   Glucose, Bld 103 (*)    Calcium 8.7 (*)    All other components within normal limits  POC OCCULT BLOOD, ED - Abnormal; Notable for the following components:   Fecal Occult Bld POSITIVE (*)    All other components within normal limits  VITAMIN B12  FOLATE  IRON AND TIBC  FERRITIN  RETICULOCYTES  TYPE AND SCREEN  ABO/RH  PREPARE RBC (CROSSMATCH)    EKG None  Radiology No results found.  Procedures .Critical Care  Performed by: Trish Furl, MD Authorized by: Trish Furl, MD   Critical care provider statement:    Critical care time (minutes):  30   Critical care was time spent personally by me on the following activities:  Development of treatment plan with patient or surrogate, discussions with consultants, evaluation of patient's response to treatment, examination of patient, ordering and review of laboratory studies, ordering  and review of radiographic studies, ordering and performing treatments and interventions, pulse oximetry, re-evaluation of patient's condition and review of old charts     Medications Ordered in ED Medications  0.9 %  sodium chloride  infusion (Manually program via Guardrails IV Fluids) (  has no administration in time range)    ED Course/ Medical Decision Making/ A&P Clinical Course as of 04/26/24 1048  Sat Apr 26, 2024  1046 Case discussed with Dr. Ned Balint regarding admission [JK]    Clinical Course User Index [JK] Trish Furl, MD                                 Medical Decision Making Problems Addressed: Gastrointestinal hemorrhage, unspecified gastrointestinal hemorrhage type: acute illness or injury that poses a threat to life or bodily functions Symptomatic anemia: acute illness or injury that poses a threat to life or bodily functions  Amount and/or Complexity of Data Reviewed Labs: ordered. Decision-making details documented in ED Course.  Risk Prescription drug management. Decision regarding hospitalization.   Patient presented to ED for evaluation of anemia.  Patient was noted to have low red blood cell count at the doctor's office yesterday.  Patient has not noticed any blood in her stool but stools reportedly have been dark.  In the ED the patient does have a hemoglobin of 5.  Patient does have Hemoccult positive stools.  Presentation consistent with GI bleeding.  She remains normotensive in the ED.  I have ordered blood transfusions.  I do not feel that reversal of her Xarelto  is necessary at this time.  I will consult with gastroenterology as well as the medical service for admission further treatment.        Final Clinical Impression(s) / ED Diagnoses Final diagnoses:  Gastrointestinal hemorrhage, unspecified gastrointestinal hemorrhage type  Symptomatic anemia    Rx / DC Orders ED Discharge Orders     None         Trish Furl, MD 04/26/24 1048

## 2024-04-27 DIAGNOSIS — J449 Chronic obstructive pulmonary disease, unspecified: Secondary | ICD-10-CM | POA: Diagnosis not present

## 2024-04-27 DIAGNOSIS — I700 Atherosclerosis of aorta: Secondary | ICD-10-CM | POA: Diagnosis not present

## 2024-04-27 DIAGNOSIS — R195 Other fecal abnormalities: Secondary | ICD-10-CM | POA: Diagnosis not present

## 2024-04-27 DIAGNOSIS — M4854XA Collapsed vertebra, not elsewhere classified, thoracic region, initial encounter for fracture: Secondary | ICD-10-CM | POA: Diagnosis present

## 2024-04-27 DIAGNOSIS — K9289 Other specified diseases of the digestive system: Secondary | ICD-10-CM | POA: Diagnosis not present

## 2024-04-27 DIAGNOSIS — Z90711 Acquired absence of uterus with remaining cervical stump: Secondary | ICD-10-CM | POA: Diagnosis not present

## 2024-04-27 DIAGNOSIS — K319 Disease of stomach and duodenum, unspecified: Secondary | ICD-10-CM | POA: Diagnosis not present

## 2024-04-27 DIAGNOSIS — K3189 Other diseases of stomach and duodenum: Secondary | ICD-10-CM | POA: Diagnosis not present

## 2024-04-27 DIAGNOSIS — F32A Depression, unspecified: Secondary | ICD-10-CM | POA: Diagnosis present

## 2024-04-27 DIAGNOSIS — R54 Age-related physical debility: Secondary | ICD-10-CM | POA: Diagnosis present

## 2024-04-27 DIAGNOSIS — Z86718 Personal history of other venous thrombosis and embolism: Secondary | ICD-10-CM | POA: Diagnosis not present

## 2024-04-27 DIAGNOSIS — I10 Essential (primary) hypertension: Secondary | ICD-10-CM | POA: Diagnosis not present

## 2024-04-27 DIAGNOSIS — M810 Age-related osteoporosis without current pathological fracture: Secondary | ICD-10-CM | POA: Diagnosis present

## 2024-04-27 DIAGNOSIS — K922 Gastrointestinal hemorrhage, unspecified: Secondary | ICD-10-CM | POA: Diagnosis not present

## 2024-04-27 DIAGNOSIS — I739 Peripheral vascular disease, unspecified: Secondary | ICD-10-CM | POA: Diagnosis present

## 2024-04-27 DIAGNOSIS — Z7901 Long term (current) use of anticoagulants: Secondary | ICD-10-CM | POA: Diagnosis not present

## 2024-04-27 DIAGNOSIS — K269 Duodenal ulcer, unspecified as acute or chronic, without hemorrhage or perforation: Secondary | ICD-10-CM | POA: Diagnosis not present

## 2024-04-27 DIAGNOSIS — K5900 Constipation, unspecified: Secondary | ICD-10-CM | POA: Diagnosis not present

## 2024-04-27 DIAGNOSIS — Z87891 Personal history of nicotine dependence: Secondary | ICD-10-CM | POA: Diagnosis not present

## 2024-04-27 DIAGNOSIS — R933 Abnormal findings on diagnostic imaging of other parts of digestive tract: Secondary | ICD-10-CM | POA: Diagnosis not present

## 2024-04-27 DIAGNOSIS — F0390 Unspecified dementia without behavioral disturbance: Secondary | ICD-10-CM | POA: Diagnosis not present

## 2024-04-27 DIAGNOSIS — J439 Emphysema, unspecified: Secondary | ICD-10-CM | POA: Diagnosis present

## 2024-04-27 DIAGNOSIS — D132 Benign neoplasm of duodenum: Secondary | ICD-10-CM | POA: Diagnosis not present

## 2024-04-27 DIAGNOSIS — F02A0 Dementia in other diseases classified elsewhere, mild, without behavioral disturbance, psychotic disturbance, mood disturbance, and anxiety: Secondary | ICD-10-CM | POA: Diagnosis present

## 2024-04-27 DIAGNOSIS — G309 Alzheimer's disease, unspecified: Secondary | ICD-10-CM | POA: Diagnosis present

## 2024-04-27 DIAGNOSIS — D62 Acute posthemorrhagic anemia: Secondary | ICD-10-CM | POA: Diagnosis present

## 2024-04-27 DIAGNOSIS — Z86711 Personal history of pulmonary embolism: Secondary | ICD-10-CM | POA: Diagnosis not present

## 2024-04-27 DIAGNOSIS — E279 Disorder of adrenal gland, unspecified: Secondary | ICD-10-CM | POA: Diagnosis not present

## 2024-04-27 DIAGNOSIS — R531 Weakness: Secondary | ICD-10-CM | POA: Diagnosis present

## 2024-04-27 DIAGNOSIS — K921 Melena: Secondary | ICD-10-CM | POA: Diagnosis not present

## 2024-04-27 DIAGNOSIS — Z860100 Personal history of colon polyps, unspecified: Secondary | ICD-10-CM | POA: Diagnosis not present

## 2024-04-27 DIAGNOSIS — R634 Abnormal weight loss: Secondary | ICD-10-CM | POA: Diagnosis not present

## 2024-04-27 DIAGNOSIS — E278 Other specified disorders of adrenal gland: Secondary | ICD-10-CM | POA: Diagnosis present

## 2024-04-27 DIAGNOSIS — E785 Hyperlipidemia, unspecified: Secondary | ICD-10-CM | POA: Diagnosis not present

## 2024-04-27 DIAGNOSIS — J432 Centrilobular emphysema: Secondary | ICD-10-CM | POA: Diagnosis not present

## 2024-04-27 DIAGNOSIS — D490 Neoplasm of unspecified behavior of digestive system: Secondary | ICD-10-CM | POA: Diagnosis not present

## 2024-04-27 DIAGNOSIS — I808 Phlebitis and thrombophlebitis of other sites: Secondary | ICD-10-CM | POA: Diagnosis not present

## 2024-04-27 DIAGNOSIS — D649 Anemia, unspecified: Secondary | ICD-10-CM | POA: Diagnosis not present

## 2024-04-27 DIAGNOSIS — D509 Iron deficiency anemia, unspecified: Secondary | ICD-10-CM | POA: Diagnosis not present

## 2024-04-27 DIAGNOSIS — I4891 Unspecified atrial fibrillation: Secondary | ICD-10-CM | POA: Diagnosis not present

## 2024-04-27 DIAGNOSIS — Z820 Family history of epilepsy and other diseases of the nervous system: Secondary | ICD-10-CM | POA: Diagnosis not present

## 2024-04-27 DIAGNOSIS — J9811 Atelectasis: Secondary | ICD-10-CM | POA: Diagnosis not present

## 2024-04-27 DIAGNOSIS — Z8701 Personal history of pneumonia (recurrent): Secondary | ICD-10-CM | POA: Diagnosis not present

## 2024-04-27 LAB — CBC
HCT: 25.9 % — ABNORMAL LOW (ref 36.0–46.0)
Hemoglobin: 8.3 g/dL — ABNORMAL LOW (ref 12.0–15.0)
MCH: 28.8 pg (ref 26.0–34.0)
MCHC: 32 g/dL (ref 30.0–36.0)
MCV: 89.9 fL (ref 80.0–100.0)
Platelets: 291 10*3/uL (ref 150–400)
RBC: 2.88 MIL/uL — ABNORMAL LOW (ref 3.87–5.11)
RDW: 16.3 % — ABNORMAL HIGH (ref 11.5–15.5)
WBC: 9.2 10*3/uL (ref 4.0–10.5)
nRBC: 0.5 % — ABNORMAL HIGH (ref 0.0–0.2)

## 2024-04-27 LAB — COMPREHENSIVE METABOLIC PANEL WITH GFR
ALT: 11 U/L (ref 0–44)
AST: 17 U/L (ref 15–41)
Albumin: 2.6 g/dL — ABNORMAL LOW (ref 3.5–5.0)
Alkaline Phosphatase: 24 U/L — ABNORMAL LOW (ref 38–126)
Anion gap: 7 (ref 5–15)
BUN: 10 mg/dL (ref 8–23)
CO2: 27 mmol/L (ref 22–32)
Calcium: 8.1 mg/dL — ABNORMAL LOW (ref 8.9–10.3)
Chloride: 103 mmol/L (ref 98–111)
Creatinine, Ser: 0.63 mg/dL (ref 0.44–1.00)
GFR, Estimated: >60 mL/min (ref >60)
Glucose, Bld: 93 mg/dL (ref 70–99)
Potassium: 3.4 mmol/L — ABNORMAL LOW (ref 3.5–5.1)
Sodium: 137 mmol/L (ref 135–145)
Total Bilirubin: 0.6 mg/dL (ref 0.0–1.2)
Total Protein: 5.1 g/dL — ABNORMAL LOW (ref 6.5–8.1)

## 2024-04-27 LAB — TYPE AND SCREEN
ABO/RH(D): AB POS
Antibody Screen: NEGATIVE
Unit division: 0
Unit division: 0

## 2024-04-27 LAB — BPAM RBC
Blood Product Expiration Date: 202507042359
Blood Product Expiration Date: 202507042359
ISSUE DATE / TIME: 202506071048
ISSUE DATE / TIME: 202506071445
Unit Type and Rh: 6200
Unit Type and Rh: 6200

## 2024-04-27 MED ORDER — METOPROLOL SUCCINATE ER 25 MG PO TB24
12.5000 mg | ORAL_TABLET | Freq: Every day | ORAL | Status: DC
  Administered 2024-04-27 – 2024-05-01 (×5): 12.5 mg via ORAL
  Filled 2024-04-27 (×5): qty 1

## 2024-04-27 MED ORDER — FERROUS SULFATE 325 (65 FE) MG PO TABS
325.0000 mg | ORAL_TABLET | ORAL | Status: DC
  Administered 2024-04-28: 325 mg via ORAL
  Filled 2024-04-27: qty 1

## 2024-04-27 NOTE — Progress Notes (Signed)
 Subjective: No complaints.  Objective: Vital signs in last 24 hours: Temp:  [98 F (36.7 C)-98.5 F (36.9 C)] 98.4 F (36.9 C) (06/08 0432) Pulse Rate:  [58-84] 61 (06/08 0728) Resp:  [17-20] 18 (06/08 0728) BP: (130-167)/(70-85) 158/71 (06/08 0432) SpO2:  [90 %-96 %] 91 % (06/08 0728) Weight change:     PE: GEN:  Thin, elderly, NAD ABD:  Soft, non-tender NEURO:  No encephalopathy  Lab Results: CBC    Component Value Date/Time   WBC 9.2 04/27/2024 0657   RBC 2.88 (L) 04/27/2024 0657   HGB 8.3 (L) 04/27/2024 0657   HCT 25.9 (L) 04/27/2024 0657   PLT 291 04/27/2024 0657   MCV 89.9 04/27/2024 0657   MCH 28.8 04/27/2024 0657   MCHC 32.0 04/27/2024 0657   RDW 16.3 (H) 04/27/2024 0657   LYMPHSABS 1.1 04/26/2024 0827   MONOABS 0.6 04/26/2024 0827   EOSABS 0.1 04/26/2024 0827   BASOSABS 0.0 04/26/2024 0827  CMP     Component Value Date/Time   NA 137 04/27/2024 0657   K 3.4 (L) 04/27/2024 0657   CL 103 04/27/2024 0657   CO2 27 04/27/2024 0657   GLUCOSE 93 04/27/2024 0657   BUN 10 04/27/2024 0657   CREATININE 0.63 04/27/2024 0657   CALCIUM 8.1 (L) 04/27/2024 0657   PROT 5.1 (L) 04/27/2024 0657   ALBUMIN 2.6 (L) 04/27/2024 0657   AST 17 04/27/2024 0657   ALT 11 04/27/2024 0657   ALKPHOS 24 (L) 04/27/2024 0657   BILITOT 0.6 04/27/2024 0657   GFR 108.75 03/01/2016 1416   GFRNONAA >60 04/27/2024 0657   Assessment:   Symptomatic anemia (weakness). Dark stools (on iron). Chronic anticoagulation, rivaroxaban , last dose Friday 6/6 evening. Weight loss. Abnormal CT (stomach thickening). Recurrent DVT, PE.  Plan:   Soft diet, NPO after midnight. Rivaroxaban  on hold. Follow CBC; transfuse as needed. Endoscopy tomorrow morning with Dr. Feliberto Hopping. Risks (bleeding, infection, bowel perforation that could require surgery, sedation-related changes in cardiopulmonary systems), benefits (identification and possible treatment of source of symptoms, exclusion of certain causes  of symptoms), and alternatives (watchful waiting, radiographic imaging studies, empiric medical treatment) of upper endoscopy (EGD) were explained to patient/family in detail and patient wishes to proceed.  [facility] will follow.   Yves Herb 04/27/2024, 2:53 PM   Cell [phone] If no answer or after 5 PM call [phone]

## 2024-04-27 NOTE — Plan of Care (Signed)

## 2024-04-27 NOTE — H&P (View-Only) (Signed)
 Subjective: No complaints.  Objective: Vital signs in last 24 hours: Temp:  [98 F (36.7 C)-98.5 F (36.9 C)] 98.4 F (36.9 C) (06/08 0432) Pulse Rate:  [58-84] 61 (06/08 0728) Resp:  [17-20] 18 (06/08 0728) BP: (130-167)/(70-85) 158/71 (06/08 0432) SpO2:  [90 %-96 %] 91 % (06/08 0728) Weight change:     PE: GEN:  Thin, elderly, NAD ABD:  Soft, non-tender NEURO:  No encephalopathy  Lab Results: CBC    Component Value Date/Time   WBC 9.2 04/27/2024 0657   RBC 2.88 (L) 04/27/2024 0657   HGB 8.3 (L) 04/27/2024 0657   HCT 25.9 (L) 04/27/2024 0657   PLT 291 04/27/2024 0657   MCV 89.9 04/27/2024 0657   MCH 28.8 04/27/2024 0657   MCHC 32.0 04/27/2024 0657   RDW 16.3 (H) 04/27/2024 0657   LYMPHSABS 1.1 04/26/2024 0827   MONOABS 0.6 04/26/2024 0827   EOSABS 0.1 04/26/2024 0827   BASOSABS 0.0 04/26/2024 0827  CMP     Component Value Date/Time   NA 137 04/27/2024 0657   K 3.4 (L) 04/27/2024 0657   CL 103 04/27/2024 0657   CO2 27 04/27/2024 0657   GLUCOSE 93 04/27/2024 0657   BUN 10 04/27/2024 0657   CREATININE 0.63 04/27/2024 0657   CALCIUM 8.1 (L) 04/27/2024 0657   PROT 5.1 (L) 04/27/2024 0657   ALBUMIN 2.6 (L) 04/27/2024 0657   AST 17 04/27/2024 0657   ALT 11 04/27/2024 0657   ALKPHOS 24 (L) 04/27/2024 0657   BILITOT 0.6 04/27/2024 0657   GFR 108.75 03/01/2016 1416   GFRNONAA >60 04/27/2024 0657   Assessment:   Symptomatic anemia (weakness). Dark stools (on iron). Chronic anticoagulation, rivaroxaban , last dose Friday 6/6 evening. Weight loss. Abnormal CT (stomach thickening). Recurrent DVT, PE.  Plan:   Soft diet, NPO after midnight. Rivaroxaban  on hold. Follow CBC; transfuse as needed. Endoscopy tomorrow morning with Dr. Feliberto Hopping. Risks (bleeding, infection, bowel perforation that could require surgery, sedation-related changes in cardiopulmonary systems), benefits (identification and possible treatment of source of symptoms, exclusion of certain causes  of symptoms), and alternatives (watchful waiting, radiographic imaging studies, empiric medical treatment) of upper endoscopy (EGD) were explained to patient/family in detail and patient wishes to proceed.  [facility] will follow.   Yves Herb 04/27/2024, 2:53 PM   Cell [phone] If no answer or after 5 PM call [phone]

## 2024-04-27 NOTE — Care Management Obs Status (Signed)
 MEDICARE OBSERVATION STATUS NOTIFICATION   Patient Details  Name: Amanda [name] MRN: [id number] Date of Birth: [birth year]   Medicare Observation Status Notification Given:  Yes    Omie Bickers, RN 04/27/2024, 4:51 PM

## 2024-04-27 NOTE — Progress Notes (Signed)
 PROGRESS NOTE  Amanda [name] JXB:[id number] DOB: Dec 02, 1932 DOA: 04/26/2024 PCP: Dorena Gander, MD   LOS: 0 days   Brief Narrative / Interim history: 89 y.o. 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  Found to be anemic with a hemoglobin of 5 with positive fecal occult.  GI consulted  Subjective / 24h Interval events: She is feeling better this morning, daughter is at bedside and tells me that she has been stronger and after receiving blood transfusion she was more awake and able to ambulate  Assesement and Plan: Principal problem Symptomatic anemia with concern for upper GI bleed, melena-CT scan October 24 showed thickened appearance of the distal stomach, this apparently was discussed as an outpatient and patient deferred an EGD.  GI consulted now, considering EGD tomorrow, however patient undecided if she wants to go forward with this - GI will follow up today - She was transfused units of packed red blood cells, hemoglobin increased appropriately and is 8.3 today -plan for EGD tomorrow morning  Active problems History of DVT/PE-hold anticoagulation for now   Underlying mild dementia-resume home medications    Essential hypertension-on the hypertensive side, resume home metoprolol  at half the dose   PAD-noted, no current symptoms  Scheduled Meds:  sodium chloride    Intravenous Once   arformoterol   15 mcg Nebulization BID   citalopram   20 mg Oral q morning   cyanocobalamin   1,000 mcg Oral q morning   donepezil   10 mg Oral QHS   [START ON 04/28/2024] ferrous sulfate   325 mg Oral Once per day on Monday Thursday   latanoprost   1 drop Both Eyes QPM   pantoprazole  (PROTONIX ) IV  40 mg Intravenous Q12H   revefenacin   175 mcg  Nebulization Daily   Continuous Infusions: PRN Meds:.acetaminophen , albuterol , ondansetron  **OR** ondansetron  (ZOFRAN ) IV  Current Outpatient Medications  Medication Instructions   acetaminophen  (TYLENOL ) 650 mg, Oral, Every 6 hours PRN   albuterol  (PROVENTIL ) 2.5 mg, Nebulization, Every 6 hours PRN   arformoterol  (BROVANA ) 15 mcg, Nebulization, 2 times daily   aspirin  EC 81 mg, Oral, Daily   citalopram  (CELEXA ) 20 mg, Oral, Every morning   cyanocobalamin  (VITAMIN B12) 1,000 mcg, Every morning   donepezil  (ARICEPT ) 10 mg, Oral, Daily at bedtime   famotidine (PEPCID) 20 mg, Oral, Daily   ferrous sulfate  325 mg, 2 times weekly   latanoprost  (XALATAN ) 0.005 % ophthalmic solution 1 drop, Every evening   metoprolol  succinate (TOPROL -XL) 25 mg, Oral, Daily   Multiple Vitamins-Minerals (CENTRUM SILVER PO) 1 tablet, Every morning   pantoprazole  (PROTONIX ) 40 mg, Oral, 2 times daily   revefenacin  (YUPELRI ) 175 mcg, Nebulization, Daily   RIVAROXABAN  (XARELTO ) VTE STARTER PACK (15 & 20 MG) Follow package directions: Take one 15mg  tablet by mouth twice a day. On day 22, switch to one 20mg  tablet once a day. Take with food.    Diet Orders (From admission, onward)     Start     Ordered   04/26/24 1327  DIET DYS 3 Room service appropriate? Yes; Fluid consistency: Thin  Diet effective now       Question Answer Comment  Room service appropriate? Yes   Fluid consistency: Thin      04/26/24 1326            DVT prophylaxis: SCDs Start: 04/26/24 1318  Lab Results  Component Value Date   PLT 291 04/27/2024      Code Status: Full Code  Family Communication: Daughter is at bedside  Status is: Observation The patient will require care spanning > 2 midnights and should be moved to inpatient because: Continue to observe hemoglobin   Level of care: Telemetry Medical  Consultants:  Gastroenterology  Objective: Vitals:   04/26/24 2013 04/27/24 0010 04/27/24 0432 04/27/24 0728  BP:  (!) 147/70 (!) 167/85 (!) 158/71   Pulse: (!) 58 61 62 61  Resp: 19 17 17 18   Temp: 98.5 F (36.9 C) 98 F (36.7 C) 98.4 F (36.9 C)   TempSrc:   Oral   SpO2: 93% 93% 90% 91%  Weight:      Height:       No intake or output data in the 24 hours ending 04/27/24 0945 Wt Readings from Last 3 Encounters:  04/26/24 48.1 kg  03/26/24 48.1 kg  12/05/23 48.1 kg    Examination:  Constitutional: NAD Eyes: no scleral icterus ENMT: Mucous membranes are moist.  Neck: normal, supple Respiratory: clear to auscultation bilaterally, no wheezing, no crackles. Normal respiratory effort. No accessory muscle use.  Cardiovascular: Regular rate and rhythm, no murmurs / rubs / gallops. No LE edema.  Abdomen: non distended, no tenderness. Bowel sounds positive.  Musculoskeletal: no clubbing / cyanosis.   Data Reviewed: I have independently reviewed following labs and imaging studies   CBC Recent Labs  Lab 04/26/24 0827 04/26/24 2137 04/27/24 0657  WBC 6.9 9.4 9.2  HGB 5.1* 8.3* 8.3*  HCT 17.4* 25.4* 25.9*  PLT 379 321 291  MCV 93.0 89.4 89.9  MCH 27.3 29.2 28.8  MCHC 29.3* 32.7 32.0  RDW 19.4* 16.2* 16.3*  LYMPHSABS 1.1  --   --   MONOABS 0.6  --   --   EOSABS 0.1  --   --   BASOSABS 0.0  --   --     Recent Labs  Lab 04/26/24 0827 04/27/24 0657  NA 138 137  K 3.5 3.4*  CL 105 103  CO2 26 27  GLUCOSE 103* 93  BUN 15 10  CREATININE 0.78 0.63  CALCIUM 8.7* 8.1*  AST  --  17  ALT  --  11  ALKPHOS  --  24*  BILITOT  --  0.6  ALBUMIN  --  2.6*    ------------------------------------------------------------------------------------------------------------------ No results for input(s): "CHOL", "HDL", "LDLCALC", "TRIG", "CHOLHDL", "LDLDIRECT" in the last 72 hours.  Lab Results  Component Value Date   HGBA1C 6.1 (H) 01/06/2015   ------------------------------------------------------------------------------------------------------------------ No results for input(s): "TSH",  "T4TOTAL", "T3FREE", "THYROIDAB" in the last 72 hours.  Invalid input(s): "FREET3"  Cardiac Enzymes No results for input(s): "CKMB", "TROPONINI", "MYOGLOBIN" in the last 168 hours.  Invalid input(s): "CK" ------------------------------------------------------------------------------------------------------------------    Component Value Date/Time   BNP 47.4 12/12/2019 1410    CBG: No results for input(s): "GLUCAP" in the last 168 hours.  No results found for this or any previous visit (from the past 240 hours).   Radiology Studies: No results found.   Kathlen Para, MD, PhD [facility]  Between 7 am - 7 pm I am available, please contact me via Amion (for emergencies) or Securechat (non urgent messages)  Between 7 pm - 7 am I am not available, please contact night coverage MD/APP via Amion

## 2024-04-27 NOTE — Anesthesia Preprocedure Evaluation (Signed)
 Anesthesia Evaluation  Patient identified by MRN, date of birth, ID band Patient awake    Reviewed: Allergy & Precautions, NPO status , Patient's Chart, lab work & pertinent test results  History of Anesthesia Complications Negative for: history of anesthetic complications  Airway Mallampati: III  TM Distance: >3 FB Neck ROM: Full    Dental  (+) Edentulous Upper, Edentulous Lower   Pulmonary COPD,  COPD inhaler, former smoker, PE Nodule    Pulmonary exam normal breath sounds clear to auscultation       Cardiovascular hypertension (metoprolol ), Pt. on home beta blockers + Peripheral Vascular Disease and + DVT  + dysrhythmias (PVCs, trigeminy) + Valvular Problems/Murmurs (mild) AI  Rhythm:Regular Rate:Normal  HLD  TTE 12/02/2022: IMPRESSIONS    1. Low normal to mildy reduced LV function; EF 50.   2. Left ventricular ejection fraction, by estimation, is 50 to 55%. The  left ventricle has low normal function. The left ventricle has no regional  wall motion abnormalities. Left ventricular diastolic parameters are  consistent with Grade I diastolic  dysfunction (impaired relaxation).   3. Right ventricular systolic function is normal. The right ventricular  size is normal. There is normal pulmonary artery systolic pressure.   4. The mitral valve is normal in structure. Trivial mitral valve  regurgitation. No evidence of mitral stenosis.   5. The aortic valve is tricuspid. Aortic valve regurgitation is mild.  Aortic valve sclerosis is present, with no evidence of aortic valve  stenosis.   6. The inferior vena cava is normal in size with greater than 50%  respiratory variability, suggesting right atrial pressure of 3 mmHg.   Normal stress test 12/30/2019    Neuro/Psych  Headaches PSYCHIATRIC DISORDERS  Depression   Dementia    GI/Hepatic ,GERD  Medicated,,  Endo/Other    Renal/GU      Musculoskeletal  (+) Arthritis ,   Osteoporosis    Abdominal   Peds  Hematology  (+) Blood dyscrasia, anemia Lab Results      Component                Value               Date                      WBC                      9.2                 04/27/2024                HGB                      8.3 (L)             04/27/2024                HCT                      25.9 (L)            04/27/2024                MCV                      89.9                04/27/2024  PLT                      291                 04/27/2024              Anesthesia Other Findings Xarelto  on hold  Hgb 5.1 --> 8.3  Reproductive/Obstetrics                             Anesthesia Physical Anesthesia Plan  ASA: 3  Anesthesia Plan: MAC   Post-op Pain Management: Minimal or no pain anticipated   Induction: Intravenous  PONV Risk Score and Plan: 2 and Propofol infusion, TIVA and Treatment may vary due to age or medical condition  Airway Management Planned: Natural Airway and Simple Face Mask  Additional Equipment:   Intra-op Plan:   Post-operative Plan:   Informed Consent: I have reviewed the patients History and Physical, chart, labs and discussed the procedure including the risks, benefits and alternatives for the proposed anesthesia with the patient or authorized representative who has indicated his/her understanding and acceptance.     Dental advisory given  Plan Discussed with: CRNA and Anesthesiologist  Anesthesia Plan Comments: (Discussed with patient risks of MAC including, but not limited to, minor pain or discomfort, hearing people in the room, and possible need for backup general anesthesia. Risks for general anesthesia also discussed including, but not limited to, sore throat, hoarse voice, chipped/damaged teeth, injury to vocal cords, nausea and vomiting, allergic reactions, lung infection, heart attack, stroke, and death. All questions answered. )       Anesthesia Quick  Evaluation

## 2024-04-28 ENCOUNTER — Encounter (HOSPITAL_COMMUNITY)
Admission: EM | Disposition: A | Discharge status: Home / Self Care | Payer: Self-pay | Source: Home / Self Care | Attending: Internal Medicine

## 2024-04-28 ENCOUNTER — Encounter (HOSPITAL_COMMUNITY): Payer: Self-pay | Admitting: Internal Medicine

## 2024-04-28 ENCOUNTER — Inpatient Hospital Stay (HOSPITAL_COMMUNITY)

## 2024-04-28 ENCOUNTER — Inpatient Hospital Stay (HOSPITAL_COMMUNITY): Admitting: Anesthesiology

## 2024-04-28 DIAGNOSIS — I10 Essential (primary) hypertension: Secondary | ICD-10-CM

## 2024-04-28 DIAGNOSIS — K3189 Other diseases of stomach and duodenum: Secondary | ICD-10-CM | POA: Diagnosis not present

## 2024-04-28 DIAGNOSIS — D490 Neoplasm of unspecified behavior of digestive system: Secondary | ICD-10-CM

## 2024-04-28 DIAGNOSIS — D649 Anemia, unspecified: Secondary | ICD-10-CM | POA: Diagnosis not present

## 2024-04-28 DIAGNOSIS — Z87891 Personal history of nicotine dependence: Secondary | ICD-10-CM | POA: Diagnosis not present

## 2024-04-28 HISTORY — PX: BIOPSY OF SKIN SUBCUTANEOUS TISSUE AND/OR MUCOUS MEMBRANE: SHX6741

## 2024-04-28 HISTORY — PX: ESOPHAGOGASTRODUODENOSCOPY: SHX5428

## 2024-04-28 LAB — BASIC METABOLIC PANEL WITH GFR
Anion gap: 5 (ref 5–15)
BUN: 11 mg/dL (ref 8–23)
CO2: 28 mmol/L (ref 22–32)
Calcium: 8.4 mg/dL — ABNORMAL LOW (ref 8.9–10.3)
Chloride: 104 mmol/L (ref 98–111)
Creatinine, Ser: 0.77 mg/dL (ref 0.44–1.00)
GFR, Estimated: >60 mL/min (ref >60)
Glucose, Bld: 100 mg/dL — ABNORMAL HIGH (ref 70–99)
Potassium: 3.7 mmol/L (ref 3.5–5.1)
Sodium: 137 mmol/L (ref 135–145)

## 2024-04-28 LAB — CBC
HCT: 25.1 % — ABNORMAL LOW (ref 36.0–46.0)
Hemoglobin: 7.9 g/dL — ABNORMAL LOW (ref 12.0–15.0)
MCH: 28.5 pg (ref 26.0–34.0)
MCHC: 31.5 g/dL (ref 30.0–36.0)
MCV: 90.6 fL (ref 80.0–100.0)
Platelets: 334 10*3/uL (ref 150–400)
RBC: 2.77 MIL/uL — ABNORMAL LOW (ref 3.87–5.11)
RDW: 16.3 % — ABNORMAL HIGH (ref 11.5–15.5)
WBC: 8.8 10*3/uL (ref 4.0–10.5)
nRBC: 0.3 % — ABNORMAL HIGH (ref 0.0–0.2)

## 2024-04-28 LAB — MAGNESIUM: Magnesium: 1.9 mg/dL (ref 1.7–2.4)

## 2024-04-28 SURGERY — EGD (ESOPHAGOGASTRODUODENOSCOPY)
Anesthesia: Monitor Anesthesia Care | Laterality: Left

## 2024-04-28 MED ORDER — LACTATED RINGERS IV SOLN: INTRAVENOUS | Status: DC | PRN

## 2024-04-28 MED ORDER — IOHEXOL 9 MG/ML PO SOLN
500.0000 mL | ORAL | Status: DC
  Administered 2024-04-28: 500 mL via ORAL

## 2024-04-28 MED ORDER — IOHEXOL 350 MG/ML SOLN
75.0000 mL | Freq: Once | INTRAVENOUS | Status: AC | PRN
  Administered 2024-04-28: 75 mL via INTRAVENOUS

## 2024-04-28 MED ORDER — LIDOCAINE 2% (20 MG/ML) 5 ML SYRINGE
INTRAMUSCULAR | Status: DC | PRN
  Administered 2024-04-28: 60 mg via INTRAVENOUS

## 2024-04-28 MED ORDER — PROPOFOL 500 MG/50ML IV EMUL
INTRAVENOUS | Status: DC | PRN
  Administered 2024-04-28: 130 ug/kg/min via INTRAVENOUS

## 2024-04-28 MED ORDER — PROPOFOL 10 MG/ML IV BOLUS
INTRAVENOUS | Status: DC | PRN
Start: 2024-04-28 — End: 2024-04-28
  Administered 2024-04-28 (×2): 30 mg via INTRAVENOUS

## 2024-04-28 NOTE — Transfer of Care (Signed)
 Immediate Anesthesia Transfer of Care Note  Patient: Amanda [name]  Procedure(s) Performed: EGD (ESOPHAGOGASTRODUODENOSCOPY) (Left) BIOPSY, SKIN, SUBCUTANEOUS TISSUE, OR MUCOUS MEMBRANE  Patient Location: PACU  Anesthesia Type:MAC  Level of Consciousness: awake, alert , and oriented  Airway & Oxygen Therapy: Patient Spontanous Breathing and Patient connected to nasal cannula oxygen  Post-op Assessment: Report given to RN and Post -op Vital signs reviewed and stable  Post vital signs: Reviewed and stable   Last Vitals:  Vitals Value Taken Time  BP    Temp    Pulse 63 04/28/24 0840  Resp 20 04/28/24 0840  SpO2 91 % 04/28/24 0840  Vitals shown include unfiled device data.  Last Pain:  Vitals:   04/28/24 0728  TempSrc: Temporal  PainSc: 0-No pain         Complications: No notable events documented.

## 2024-04-28 NOTE — Progress Notes (Signed)
 PROGRESS NOTE  Amanda [name] ZOX:[id number] DOB: 1933/02/25 DOA: 04/26/2024 PCP: Dorena Gander, MD   LOS: 1 day   Brief Narrative / Interim history: 89 y.o. 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  Found to be anemic with a hemoglobin of 5 with positive fecal occult.  GI consulted  Subjective / 24h Interval events: Feeling well today, about to go down for her endoscopy.  Had a good night  Assesement and Plan: Principal problem Symptomatic anemia with concern for upper GI bleed, melena-CT scan October 24 showed thickened appearance of the distal stomach, this apparently was discussed as an outpatient and patient deferred an EGD.  GI consulted, eventually underwent an endoscopy on 6/9 which showed a large infiltrative circumferential mass with no bleeding, involving pylorus, pyloric channel and duodenal bulb.  Biopsies were done - Will obtain surgical input, will also need oncology once biopsies are back  Active problems History of DVT/PE-hold anticoagulation for now   Underlying mild dementia-resume home medications    Essential hypertension-continue home metoprolol   PAD-noted, no current symptoms  Scheduled Meds:  [MAR Hold] sodium chloride    Intravenous Once   [MAR Hold] arformoterol   15 mcg Nebulization BID   [MAR Hold] citalopram   20 mg Oral q morning   [MAR Hold] cyanocobalamin   1,000 mcg Oral q morning   [MAR Hold] donepezil   10 mg Oral QHS   [MAR Hold] ferrous sulfate   325 mg Oral Once per day on Monday Thursday   [MAR Hold] latanoprost   1 drop Both Eyes QPM   [MAR Hold] metoprolol  succinate  12.5 mg Oral Daily   [MAR Hold] pantoprazole  (PROTONIX ) IV  40 mg Intravenous Q12H   [MAR Hold] revefenacin   175 mcg Nebulization  Daily   Continuous Infusions: PRN Meds:.[MAR Hold] acetaminophen , [MAR Hold] albuterol , [MAR Hold] ondansetron  **OR** [MAR Hold] ondansetron  (ZOFRAN ) IV  Current Outpatient Medications  Medication Instructions   acetaminophen  (TYLENOL ) 650 mg, Oral, Every 6 hours PRN   albuterol  (PROVENTIL ) 2.5 mg, Nebulization, Every 6 hours PRN   arformoterol  (BROVANA ) 15 mcg, Nebulization, 2 times daily   aspirin  EC 81 mg, Oral, Daily   citalopram  (CELEXA ) 20 mg, Oral, Every morning   cyanocobalamin  (VITAMIN B12) 1,000 mcg, Every morning   donepezil  (ARICEPT ) 10 mg, Oral, Daily at bedtime   famotidine (PEPCID) 20 mg, Oral, Daily   ferrous sulfate  325 mg, 2 times weekly   latanoprost  (XALATAN ) 0.005 % ophthalmic solution 1 drop, Every evening   metoprolol  succinate (TOPROL -XL) 25 mg, Oral, Daily   Multiple Vitamins-Minerals (CENTRUM SILVER PO) 1 tablet, Every morning   pantoprazole  (PROTONIX ) 40 mg, Oral, 2 times daily   revefenacin  (YUPELRI ) 175 mcg, Nebulization, Daily   RIVAROXABAN  (XARELTO ) VTE STARTER PACK (15 & 20 MG) Follow package directions: Take one 15mg  tablet by mouth twice a day. On day 22, switch to one 20mg  tablet once a day. Take with food.    Diet Orders (From admission, onward)     Start     Ordered   04/28/24 0001  Diet NPO time specified  Diet effective midnight        04/27/24 1456            DVT prophylaxis: SCDs Start: 04/26/24 1318   Lab Results  Component Value Date   PLT 334 04/28/2024  Code Status: Full Code  Family Communication: Daughter is at bedside  Status is: Observation The patient will require care spanning > 2 midnights and should be moved to inpatient because: Continue to observe hemoglobin   Level of care: Telemetry Medical  Consultants:  Gastroenterology  Objective: Vitals:   04/28/24 0844 04/28/24 0851 04/28/24 0900 04/28/24 0930  BP: (!) 93/50 (!) 99/58 (!) 140/70 (!) 156/76  Pulse: 65 66 (!) 55 (!) 58  Resp: 19 19 (!) 21 19   Temp: 98 F (36.7 C)   98.3 F (36.8 C)  TempSrc: Temporal     SpO2: 92% 96% 96% 95%  Weight:      Height:        Intake/Output Summary (Last 24 hours) at 04/28/2024 0933 Last data filed at 04/28/2024 0831 Gross per 24 hour  Intake 250 ml  Output --  Net 250 ml   Wt Readings from Last 3 Encounters:  04/26/24 48.1 kg  03/26/24 48.1 kg  12/05/23 48.1 kg    Examination:  Constitutional: NAD Eyes: lids and conjunctivae normal, no scleral icterus ENMT: mmm Neck: normal, supple Respiratory: clear to auscultation bilaterally, no wheezing, no crackles.  Cardiovascular: Regular rate and rhythm, no murmurs / rubs / gallops. No LE edema. Abdomen: soft, no distention, no tenderness. Bowel sounds positive.   Data Reviewed: I have independently reviewed following labs and imaging studies   CBC Recent Labs  Lab 04/26/24 0827 04/26/24 2137 04/27/24 0657 04/28/24 0437  WBC 6.9 9.4 9.2 8.8  HGB 5.1* 8.3* 8.3* 7.9*  HCT 17.4* 25.4* 25.9* 25.1*  PLT 379 321 291 334  MCV 93.0 89.4 89.9 90.6  MCH 27.3 29.2 28.8 28.5  MCHC 29.3* 32.7 32.0 31.5  RDW 19.4* 16.2* 16.3* 16.3*  LYMPHSABS 1.1  --   --   --   MONOABS 0.6  --   --   --   EOSABS 0.1  --   --   --   BASOSABS 0.0  --   --   --     Recent Labs  Lab 04/26/24 0827 04/27/24 0657 04/28/24 0437  NA 138 137 137  K 3.5 3.4* 3.7  CL 105 103 104  CO2 26 27 28   GLUCOSE 103* 93 100*  BUN 15 10 11   CREATININE 0.78 0.63 0.77  CALCIUM 8.7* 8.1* 8.4*  AST  --  17  --   ALT  --  11  --   ALKPHOS  --  24*  --   BILITOT  --  0.6  --   ALBUMIN  --  2.6*  --   MG  --   --  1.9    ------------------------------------------------------------------------------------------------------------------ No results for input(s): "CHOL", "HDL", "LDLCALC", "TRIG", "CHOLHDL", "LDLDIRECT" in the last 72 hours.  Lab Results  Component Value Date   HGBA1C 6.1 (H) 01/06/2015    ------------------------------------------------------------------------------------------------------------------ No results for input(s): "TSH", "T4TOTAL", "T3FREE", "THYROIDAB" in the last 72 hours.  Invalid input(s): "FREET3"  Cardiac Enzymes No results for input(s): "CKMB", "TROPONINI", "MYOGLOBIN" in the last 168 hours.  Invalid input(s): "CK" ------------------------------------------------------------------------------------------------------------------    Component Value Date/Time   BNP 47.4 12/12/2019 1410    CBG: No results for input(s): "GLUCAP" in the last 168 hours.  No results found for this or any previous visit (from the past 240 hours).   Radiology Studies: No results found.   Kathlen Para, MD, PhD [facility]  Between 7 am - 7 pm I am available, please contact me via  Amion (for emergencies) or Securechat (non urgent messages)  Between 7 pm - 7 am I am not available, please contact night coverage MD/APP via Amion

## 2024-04-28 NOTE — Discharge Instructions (Addendum)
 Amanda [name]

## 2024-04-28 NOTE — Progress Notes (Signed)
 Patient started drinking oral contrast @ 1745 patient finished second bottle of  contrast @ 1830. Tolerated well.

## 2024-04-28 NOTE — Interval H&P Note (Signed)
 History and Physical Interval Note: 89 year old female with symptomatic anemia, dark stools, Xarelto  on hold since 6/6, weight loss, abnormal CAT scan showing stomach thickening for EGD with propofol.  04/28/2024 7:39 AM  [name]  has presented today for EGD with propofol, with the diagnosis of anemia, melena.  The various methods of treatment have been discussed with the patient and family. After consideration of risks, benefits and other options for treatment, the patient has consented to  Procedure(s): EGD (ESOPHAGOGASTRODUODENOSCOPY) (Left) as a surgical intervention.  The patient's history has been reviewed, patient examined, no change in status, stable for surgery.  I have reviewed the patient's chart and labs.  Questions were answered to the patient's satisfaction.     Amanda [name]

## 2024-04-28 NOTE — Anesthesia Postprocedure Evaluation (Signed)
 Anesthesia Post Note  Patient: Amanda [name]  Procedure(s) Performed: EGD (ESOPHAGOGASTRODUODENOSCOPY) (Left) BIOPSY, SKIN, SUBCUTANEOUS TISSUE, OR MUCOUS MEMBRANE     Patient location during evaluation: PACU Anesthesia Type: MAC Level of consciousness: awake Pain management: pain level controlled Vital Signs Assessment: post-procedure vital signs reviewed and stable Respiratory status: spontaneous breathing, nonlabored ventilation and respiratory function stable Cardiovascular status: stable and blood pressure returned to baseline Postop Assessment: no apparent nausea or vomiting Anesthetic complications: no   There were no known notable events for this encounter.  Last Vitals:  Vitals:   04/28/24 0930 04/28/24 1010  BP: (!) 156/76 (!) 156/76  Pulse: (!) 58   Resp: 19   Temp: 36.8 C   SpO2: 95%     Last Pain:  Vitals:   04/28/24 0900  TempSrc:   PainSc: 3                  Conard Decent

## 2024-04-28 NOTE — Op Note (Signed)
 [facility] Patient Name: Amanda [name] Procedure Date : 04/28/2024 MRN: [id number] Attending MD: Genell Ken , MD, [id number] Date of Birth: 09-27-1933 CSN: [id number] Age: 89 Admit Type: Inpatient Procedure:                Upper GI endoscopy Indications:              Iron deficiency anemia, Melena, Abnormal CT of the                            GI tract showed gastric thickening, weight loss Providers:                Genell Ken, MD, Nikki Barters RN, RN, Kimberly Penna                            [place]bumina, Technician Referring MD:             [facility] Medicines:                Monitored Anesthesia Care Complications:            No immediate complications. Estimated Blood Loss:     Estimated blood loss was minimal. Procedure:                Pre-Anesthesia Assessment:                           - Prior to the procedure, a History and Physical                            was performed, and patient medications and                            allergies were reviewed. The patient's tolerance of                            previous anesthesia was also reviewed. The risks                            and benefits of the procedure and the sedation                            options and risks were discussed with the patient.                            All questions were answered, and informed consent                            was obtained. Prior Anticoagulants: The patient has                            taken Xarelto  (rivaroxaban ), last dose was 3 days                            prior to procedure. ASA Grade Assessment: III - A  patient with severe systemic disease. After                            reviewing the risks and benefits, the patient was                            deemed in satisfactory condition to undergo the                            procedure.                           After obtaining informed consent, the endoscope was                             passed under direct vision. Throughout the                            procedure, the patient's blood pressure, pulse, and                            oxygen saturations were monitored continuously. The                            [id number]-H190 ([id number]) Olympus endoscope was introduced                            through the mouth, and advanced to the second part                            of duodenum. The upper GI endoscopy was                            accomplished without difficulty. The patient                            tolerated the procedure well. Scope In: Scope Out: Findings:      The examined esophagus was normal.      A large, frond-like/villous, fungating, infiltrative and sessile,       circumferential mass with no bleeding and no stigmata of recent bleeding       was found at the pylorus. Biopsies were taken with a cold forceps for       histology.      Food (residue) was found in the gastric fundus and in the gastric body.      A large frond-like/villous, fungating, infiltrative and sessile mass       with no bleeding was found in the duodenal bulb. Biopsies were taken       with a cold forceps for histology.      The mass appeared to involve the pylorus, pyloric channel and the       duodenal bulb. Impression:               - Normal esophagus.                           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                            Biopsied.                           - Food (residue) in the stomach.                           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 Biopsied.                           - The mass appeared to involve the pylorus, pyloric                            channel and the duodenal bulb. Moderate Sedation:      Patient did not receive moderate sedation for this procedure, but       instead received monitored anesthesia care. Recommendation:           - Advance diet as tolerated.                           - Continue present medications.                            - Await pathology results. Procedure Code(s):        --- Professional ---                           [phone], Esophagogastroduodenoscopy, flexible,                            transoral; with biopsy, single or multiple Diagnosis Code(s):        --- Professional ---                           D49.0, Neoplasm of unspecified behavior of                            digestive system                           K31.89, Other diseases of stomach and duodenum                           D50.9, Iron deficiency anemia, unspecified                           K92.1, Melena (includes Hematochezia)                           R93.3, Abnormal findings on diagnostic imaging of                            other parts of digestive tract CPT copyright 2022 [employer]. All rights reserved. The codes documented in this report are preliminary and upon coder review may  be revised to meet current compliance requirements. Genell Ken, MD 04/28/2024 8:41:27 AM This report has been signed electronically. Number of Addenda: 0

## 2024-04-28 NOTE — Consult Note (Signed)
 Amanda [name] April 28, 1933  [id number].    Requesting MD: Dr. Dorean Gambles Chief Complaint/Reason for Consult: UGI bleed, Mass at pylorus, pyloric channel and duodenal bulb found on endoscopy 6/9  HPI: Amanda [name] is a 89 y.o. with a history of hypertension, hyperlipidemia, DVT/PE on Xarelto  (last dose 6/6 PM) who was recently treated for pneumonia as an outpatient who presented to the ED on 6/7 with low blood pressures and dark stool.  Patient is on iron. During workup she was found to have a hemoglobin of 5.1 and fecal occult positive.  [facility] admitted the patient.  GI was consulted.  Patient received 2 units PRBC and anticoagulation was held.  Patient had appropriate response to PRBC.  Hemoglobin stable at 7.9 from 8.3 on today's labs.  Patient underwent endoscopy today by GI that showed a large, frond- like/ villous, fungating, infiltrative and sessile, circumferential mass with no bleeding and no stigmata of recent bleeding found at the pylorus and a large frond- like/ villous, fungating, infiltrative and sessile mass with no bleeding was found in the duodenal bulb. GI reports the mass appears to involve the pylorus, pyloric channel and duodenal bulb. A CT scan was ordered. General surgery was asked to see.   Patient seen. She is currently A&O x 3 (not orientated to year, thought it was 69). Family at bedside. They report she has had steady, unintentional weight loss for several months. She seems to be eating less. Patient denies abdominal pain, n/v and has been able to eat since her endoscopy. Last BM yesterday and non-bloody and non-melanous per report. 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 No family hx or personal hx of GI CA.   At baseline patient lives at home alone and walks without an assistive device. Over the last several months she has become increasing more fatigue and sob with activity where she needs to stop and take breaks.    Prior Abdominal Surgeries: Partial hysterectomy Last Colonoscopy: 2011 per GI notes with removal of a couple adenomatous polyps. Unable to see results in our system  ROS: ROS As above, see hpi  Family History  Problem Relation Age of Onset   Alzheimer's disease Mother    Lung cancer Neg Hx    Memory loss Neg Hx     Past Medical History:  Diagnosis Date   Depression    DVT (deep venous thrombosis) (HCC)    Hyperlipidemia    Hypertension    Memory disorder    Osteoporosis    PVC (premature ventricular contraction)     Past Surgical History:  Procedure Laterality Date   PARTIAL HYSTERECTOMY      Social History:  reports that she quit smoking about 52 years ago. Her smoking use included cigarettes. She started smoking about 69 years ago. She has never used smokeless tobacco. She reports that she does not drink alcohol and does not use drugs.  Allergies:  Allergies  Allergen Reactions   Memantine  Other (See Comments)    Medications Prior to Admission  Medication Sig Dispense Refill   acetaminophen  (TYLENOL ) 325 MG tablet Take 2 tablets (650 mg total) by mouth every 6 (six) hours as needed for mild pain, moderate pain, fever or headache (or Fever >/= 101).     albuterol  (PROVENTIL ) (2.5 MG/3ML) 0.083% nebulizer solution Take 3 mLs (2.5 mg total) by nebulization every 6 (six) hours as needed for wheezing or shortness of breath. 75 mL 5  arformoterol  (BROVANA ) 15 MCG/2ML NEBU Take 2 mLs (15 mcg total) by nebulization 2 (two) times daily. 120 mL 6   aspirin  EC 81 MG tablet Take 1 tablet (81 mg total) by mouth daily.     citalopram  (CELEXA ) 20 MG tablet Take 1 tablet (20 mg total) by mouth every morning. 30 tablet 0   cyanocobalamin  (VITAMIN B12) 1000 MCG tablet Take 1,000 mcg by mouth every morning.     donepezil  (ARICEPT ) 10 MG tablet Take 10 mg by mouth at bedtime.     famotidine (PEPCID) 20 MG tablet Take 20 mg by mouth daily.     ferrous sulfate  325 (65 FE) MG EC  tablet Take 325 mg by mouth 2 (two) times a week.     latanoprost  (XALATAN ) 0.005 % ophthalmic solution Place 1 drop into both eyes every evening.     metoprolol  succinate (TOPROL -XL) 25 MG 24 hr tablet Take 1 tablet (25 mg total) by mouth daily. 30 tablet 0   Multiple Vitamins-Minerals (CENTRUM SILVER PO) Take 1 tablet by mouth every morning.     pantoprazole  (PROTONIX ) 40 MG tablet Take 1 tablet (40 mg total) by mouth 2 (two) times daily. 60 tablet 0   RIVAROXABAN  (XARELTO ) VTE STARTER PACK (15 & 20 MG) Follow package directions: Take one 15mg  tablet by mouth twice a day. On day 22, switch to one 20mg  tablet once a day. Take with food. 51 each 0   revefenacin  (YUPELRI ) 175 MCG/3ML nebulizer solution Take 3 mLs (175 mcg total) by nebulization daily. 90 mL 5     Physical Exam: Blood pressure (!) 156/76, pulse (!) 58, temperature 98.3 F (36.8 C), resp. rate 19, height 5' (1.524 m), weight 48.1 kg, SpO2 95%. General: pleasant, frail elderly female who is laying in bed in NAD HEENT: head is normocephalic, atraumatic.  Sclera are non-icteric.  Heart: regular, rate, and rhythm.   Lungs: Normal rate and effort.  Abd: Soft, ND, reports upper abdominal ttp without rigidity or guarding. No palpable masses, hernias, or organomegaly MS: no BUE edema Skin: warm and dry  Psych: A&Ox3 with an appropriate affect Neuro: normal speech, thought process intact, gait not assessed   Results for orders placed or performed during the hospital encounter of 04/26/24 (from the past 48 hours)  Vitamin B12     Status: Abnormal   Collection Time: 04/26/24  9:37 PM  Result Value Ref Range   Vitamin B-12 1,429 (H) 180 - 914 pg/mL    Comment: (NOTE) This assay is not validated for testing neonatal or myeloproliferative syndrome specimens for Vitamin B12 levels. Performed at [facility], [address], [place], [place] [address]   Folate     Status: None   Collection Time: 04/26/24  9:37 PM  Result  Value Ref Range   Folate 26.3 >5.9 ng/mL    Comment: RESULT CONFIRMED BY MANUAL DILUTION Performed at [facility], [address] [place], [place] [address]   Iron and TIBC     Status: Abnormal   Collection Time: 04/26/24  9:37 PM  Result Value Ref Range   Iron 22 (L) 28 - 170 ug/dL   TIBC 784 696 - 295 ug/dL   Saturation Ratios 6 (L) 10.4 - 31.8 %   UIBC 360 ug/dL    Comment: Performed at [facility], [address] [place], [place] [address]  Ferritin     Status: Abnormal   Collection Time: 04/26/24  9:37 PM  Result  Value Ref Range   Ferritin 5 (L) 11 - 307 ng/mL    Comment: Performed at [facility], [address] [place], [place] [address]  Reticulocytes     Status: Abnormal   Collection Time: 04/26/24  9:37 PM  Result Value Ref Range   Retic Ct Pct 4.3 (H) 0.4 - 3.1 %   RBC. 2.86 (L) 3.87 - 5.11 MIL/uL   Retic Count, Absolute 123.6 19.0 - 186.0 K/uL   Immature Retic Fract 35.9 (H) 2.3 - 15.9 %    Comment: Performed at [facility], [address], [place], [place] [address]  CBC     Status: Abnormal   Collection Time: 04/26/24  9:37 PM  Result Value Ref Range   WBC 9.4 4.0 - 10.5 K/uL   RBC 2.84 (L) 3.87 - 5.11 MIL/uL   Hemoglobin 8.3 (L) 12.0 - 15.0 g/dL    Comment: REPEATED TO VERIFY POST TRANSFUSION SPECIMEN    HCT 25.4 (L) 36.0 - 46.0 %   MCV 89.4 80.0 - 100.0 fL   MCH 29.2 26.0 - 34.0 pg   MCHC 32.7 30.0 - 36.0 g/dL   RDW 09.8 (H) 11.9 - 14.7 %   Platelets 321 150 - 400 K/uL   nRBC 0.6 (H) 0.0 - 0.2 %    Comment: Performed at [facility], [address] [place], [place] [address]  Comprehensive metabolic panel     Status: Abnormal   Collection Time: 04/27/24  6:57 AM  Result Value Ref Range   Sodium 137 135 - 145 mmol/L   Potassium 3.4 (L) 3.5 - 5.1 mmol/L   Chloride 103 98 - 111 mmol/L   CO2 27 22 - 32 mmol/L   Glucose, Bld 93 70 - 99 mg/dL    Comment: Glucose reference range applies only to samples taken after  fasting for at least 8 hours.   BUN 10 8 - 23 mg/dL   Creatinine, Ser 2.13 0.44 - 1.00 mg/dL   Calcium 8.1 (L) 8.9 - 10.3 mg/dL   Total Protein 5.1 (L) 6.5 - 8.1 g/dL   Albumin 2.6 (L) 3.5 - 5.0 g/dL   AST 17 15 - 41 U/L   ALT 11 0 - 44 U/L   Alkaline Phosphatase 24 (L) 38 - 126 U/L   Total Bilirubin 0.6 0.0 - 1.2 mg/dL   GFR, Estimated >08 >65 mL/min    Comment: (NOTE) Calculated using the CKD-EPI Creatinine Equation (2021)    Anion gap 7 5 - 15    Comment: Performed at [facility], [address] [place], [place] [address]  CBC     Status: Abnormal   Collection Time: 04/27/24  6:57 AM  Result Value Ref Range   WBC 9.2 4.0 - 10.5 K/uL   RBC 2.88 (L) 3.87 - 5.11 MIL/uL   Hemoglobin 8.3 (L) 12.0 - 15.0 g/dL   HCT 62.9 (L) 52.8 - 41.3 %   MCV 89.9 80.0 - 100.0 fL   MCH 28.8 26.0 - 34.0 pg   MCHC 32.0 30.0 - 36.0 g/dL   RDW 24.4 (H) 01.0 - 27.2 %   Platelets 291 150 - 400 K/uL   nRBC 0.5 (H) 0.0 - 0.2 %    Comment: Performed at [facility], [address] [place], [place] [address]  CBC     Status: Abnormal   Collection Time: 04/28/24  4:37 AM  Result Value Ref Range   WBC 8.8  4.0 - 10.5 K/uL   RBC 2.77 (L) 3.87 - 5.11 MIL/uL   Hemoglobin 7.9 (L) 12.0 - 15.0 g/dL   HCT 78.2 (L) 95.6 - 21.3 %   MCV 90.6 80.0 - 100.0 fL   MCH 28.5 26.0 - 34.0 pg   MCHC 31.5 30.0 - 36.0 g/dL   RDW 08.6 (H) 57.8 - 46.9 %   Platelets 334 150 - 400 K/uL   nRBC 0.3 (H) 0.0 - 0.2 %    Comment: Performed at [facility], [address], [place], [place] [address]  Basic metabolic panel with GFR     Status: Abnormal   Collection Time: 04/28/24  4:37 AM  Result Value Ref Range   Sodium 137 135 - 145 mmol/L   Potassium 3.7 3.5 - 5.1 mmol/L   Chloride 104 98 - 111 mmol/L   CO2 28 22 - 32 mmol/L   Glucose, Bld 100 (H) 70 - 99 mg/dL    Comment: Glucose reference range applies only to samples taken after fasting for at least 8 hours.   BUN 11 8 - 23 mg/dL   Creatinine, Ser  8.41 0.44 - 1.00 mg/dL   Calcium 8.4 (L) 8.9 - 10.3 mg/dL   GFR, Estimated >32 >44 mL/min    Comment: (NOTE) Calculated using the CKD-EPI Creatinine Equation (2021)    Anion gap 5 5 - 15    Comment: Performed at [facility], [address] [place], [place] [address]  Magnesium     Status: None   Collection Time: 04/28/24  4:37 AM  Result Value Ref Range   Magnesium 1.9 1.7 - 2.4 mg/dL    Comment: Performed at [facility], [address], [place], [place] [address]   No results found.  Anti-infectives (From admission, onward)    None       Assessment/Plan Pyloric/Duodenal Bulb Mass UGI bleed on Xarelto   Amanda [name] is a 89 y.o. who presented with melena and was found to have hemoglobin of 5.1 and fecal occult positive.  Patient underwent endoscopy 6/9 by GI that showed a large, frond- like/ villous, fungating, infiltrative and sessile, circumferential mass with no bleeding and no stigmata of recent bleeding found at the pylorus and a large frond- like/ villous, fungating, infiltrative and sessile mass with no bleeding was found in the duodenal bulb. GI reports the mass appears to involve the pylorus, pyloric channel and duodenal bulb.   Recommendations - Hold anticoagulation. Trend hgb. Hgb on today's labs.  - Follow up pathology, pending - CT CAP. This has already been ordered by [facility]. Will follow up on results - CA 19-9, CEA - Further recs to follow pending above workup - After discussion with patient and family (HCPOA) they are not sure patient would want surgery if this was recommended/offered.  If surgery is recommended and patient/family wish to pursue surgery, I do think she would benefit from a Cardiology consult before to help restratification before undergoing general anesthesia.  - We will follow with you  FEN - On d3 diet per GI VTE - SCDs, on hold ID - None  I reviewed nursing notes, Consultant (GI) notes, hospitalist notes, last 24 h vitals and  pain scores, last 48 h intake and output, last 24 h labs and trends, and last 24 h imaging results.  Delton Filbert, [facility] 04/28/2024, 11:20 AM Please see Amion for pager number during day hours 7:00am-4:30pm

## 2024-04-28 NOTE — Plan of Care (Signed)
 Assumed care at 1900. Pt has been resting comfortably in bed overnight. Pt has no pain at this time. Pt has been Aox2-3 overnight. Pt confused on the situation. No significant events overnight. Family would like to be updated on the time of the EGD today. Family at the bedside. Pt ambulated to bathroom with assistance.    Problem: Health Behavior/Discharge Planning: Goal: Ability to manage health-related needs will improve Outcome: Progressing   Problem: Clinical Measurements: Goal: Ability to maintain clinical measurements within normal limits will improve Outcome: Progressing Goal: Will remain free from infection Outcome: Progressing Goal: Diagnostic test results will improve Outcome: Progressing Goal: Respiratory complications will improve Outcome: Progressing Goal: Cardiovascular complication will be avoided Outcome: Progressing   Problem: Activity: Goal: Risk for activity intolerance will decrease Outcome: Progressing   Problem: Nutrition: Goal: Adequate nutrition will be maintained Outcome: Progressing   Problem: Coping: Goal: Level of anxiety will decrease Outcome: Progressing   Problem: Elimination: Goal: Will not experience complications related to bowel motility Outcome: Progressing Goal: Will not experience complications related to urinary retention Outcome: Progressing

## 2024-04-29 DIAGNOSIS — K319 Disease of stomach and duodenum, unspecified: Secondary | ICD-10-CM | POA: Diagnosis not present

## 2024-04-29 DIAGNOSIS — I10 Essential (primary) hypertension: Secondary | ICD-10-CM

## 2024-04-29 DIAGNOSIS — E279 Disorder of adrenal gland, unspecified: Secondary | ICD-10-CM

## 2024-04-29 DIAGNOSIS — F32A Depression, unspecified: Secondary | ICD-10-CM

## 2024-04-29 DIAGNOSIS — I808 Phlebitis and thrombophlebitis of other sites: Secondary | ICD-10-CM

## 2024-04-29 DIAGNOSIS — F0390 Unspecified dementia without behavioral disturbance: Secondary | ICD-10-CM

## 2024-04-29 DIAGNOSIS — E785 Hyperlipidemia, unspecified: Secondary | ICD-10-CM

## 2024-04-29 DIAGNOSIS — D509 Iron deficiency anemia, unspecified: Secondary | ICD-10-CM | POA: Diagnosis not present

## 2024-04-29 DIAGNOSIS — J449 Chronic obstructive pulmonary disease, unspecified: Secondary | ICD-10-CM

## 2024-04-29 DIAGNOSIS — Z86711 Personal history of pulmonary embolism: Secondary | ICD-10-CM

## 2024-04-29 DIAGNOSIS — Z86718 Personal history of other venous thrombosis and embolism: Secondary | ICD-10-CM

## 2024-04-29 DIAGNOSIS — D649 Anemia, unspecified: Secondary | ICD-10-CM | POA: Diagnosis not present

## 2024-04-29 LAB — BASIC METABOLIC PANEL WITH GFR
Anion gap: 6 (ref 5–15)
BUN: 10 mg/dL (ref 8–23)
CO2: 27 mmol/L (ref 22–32)
Calcium: 8 mg/dL — ABNORMAL LOW (ref 8.9–10.3)
Chloride: 102 mmol/L (ref 98–111)
Creatinine, Ser: 0.76 mg/dL (ref 0.44–1.00)
GFR, Estimated: >60 mL/min (ref >60)
Glucose, Bld: 98 mg/dL (ref 70–99)
Potassium: 3.6 mmol/L (ref 3.5–5.1)
Sodium: 135 mmol/L (ref 135–145)

## 2024-04-29 LAB — CANCER ANTIGEN 19-9: CA 19-9: 9 U/mL (ref 0–35)

## 2024-04-29 LAB — CBC
HCT: 23.3 % — ABNORMAL LOW (ref 36.0–46.0)
Hemoglobin: 7.2 g/dL — ABNORMAL LOW (ref 12.0–15.0)
MCH: 28 pg (ref 26.0–34.0)
MCHC: 30.9 g/dL (ref 30.0–36.0)
MCV: 90.7 fL (ref 80.0–100.0)
Platelets: 329 10*3/uL (ref 150–400)
RBC: 2.57 MIL/uL — ABNORMAL LOW (ref 3.87–5.11)
RDW: 16.4 % — ABNORMAL HIGH (ref 11.5–15.5)
WBC: 8.2 10*3/uL (ref 4.0–10.5)
nRBC: 0 % (ref 0.0–0.2)

## 2024-04-29 LAB — SURGICAL PATHOLOGY

## 2024-04-29 LAB — CEA: CEA: 1.1 ng/mL (ref 0.0–4.7)

## 2024-04-29 LAB — MAGNESIUM: Magnesium: 1.9 mg/dL (ref 1.7–2.4)

## 2024-04-29 MED ORDER — FERROUS SULFATE 325 (65 FE) MG PO TABS
325.0000 mg | ORAL_TABLET | Freq: Two times a day (BID) | ORAL | Status: DC
  Administered 2024-04-30 – 2024-05-01 (×3): 325 mg via ORAL
  Filled 2024-04-29 (×3): qty 1

## 2024-04-29 NOTE — Plan of Care (Signed)

## 2024-04-29 NOTE — TOC Initial Note (Signed)
 Transition of Care [facility]) - Initial/Assessment Note    Patient Details  Name: Amanda [name] MRN: [id number] Date of Birth: 1933/08/04  Transition of Care [facility]) CM/SW Contact:    Amanda Bickers, RN Phone Number: 04/29/2024, 1:24 PM  Clinical Narrative:                  Amanda [name] w patient and daughter Amanda [name] at bedside. Patient is from home, alone, Alfreda states she is on FMLA and will be helping with patient after DC. Patient also has 2 granddaughters that live close by. Patient has cane, walker, and shower seat at home.  Following for transition needs.   Expected Discharge Plan: Home/Self Care Barriers to Discharge: Continued Medical Work up   Patient Goals and CMS Choice Patient states their goals for this hospitalization and ongoing recovery are:: to go home          Expected Discharge Plan and Services   Discharge Planning Services: CM Consult   Living arrangements for the past 2 months: Single Family Home                                      Prior Living Arrangements/Services Living arrangements for the past 2 months: Single Family Home Lives with:: Self              Current home services: DME    Activities of Daily Living      Permission Sought/Granted                  Emotional Assessment              Admission diagnosis:  Symptomatic anemia [D64.9] Gastrointestinal hemorrhage, unspecified gastrointestinal hemorrhage type [K92.2] Patient Active Problem List   Diagnosis Date Noted   Symptomatic anemia 04/26/2024   Acute pulmonary embolism (HCC) 12/01/2022   Pulmonary nodule 12/01/2022   COVID-19 virus infection 07/29/2021   History of colonic polyps 03/21/2021   Plantar wart of right foot 03/21/2021   Recurrent falls 03/21/2021   Recurrent major depression in remission (HCC) 03/21/2021   Urinary incontinence 03/21/2021   Vitamin B12 deficiency 03/21/2021   Abnormal gait 02/25/2021   Centrilobular emphysema (HCC) 02/25/2021    Chronic pain 02/25/2021   Dementia (HCC) 02/25/2021   Dizzy 02/25/2021   Frailty 02/25/2021   Hardening of the aorta (main artery of the heart) (HCC) 02/25/2021   Hemoglobin low 02/25/2021   Iron deficiency anemia 02/25/2021   Loss of appetite 02/25/2021   Malnutrition of moderate degree (Gomez: 60% to less than 75% of standard weight) (HCC) 02/25/2021   Mild major depression, single episode (HCC) 02/25/2021   Localized, primary osteoarthritis of shoulder region 02/25/2021   Repeated falls 09/16/2020   Disequilibrium 09/16/2020   Unspecified dementia, unspecified severity, without behavioral disturbance, psychotic disturbance, mood disturbance, and anxiety (HCC) 09/15/2020   DOE (dyspnea on exertion) 12/11/2019   Educated about COVID-19 virus infection 12/11/2019   Age-related osteoporosis without current pathological fracture 11/21/2019   Migraine, unspecified, not intractable, without status migrainosus 11/21/2019   Moderate protein-calorie malnutrition (HCC) 11/21/2019   Personal history of nicotine dependence 11/21/2019   Peripheral vascular disease, unspecified (HCC) 11/21/2019   Loss of weight 03/01/2016   Hyperlipidemia 10/25/2015   PVC (premature ventricular contraction) 01/21/2015   Hypertensive cardiovascular disease 01/21/2015   Chest pain 01/05/2015   HTN (hypertension) 01/05/2015   PCP:  Dorena Gander, MD Pharmacy:  [id number] [place], [place] - [address] AT [facility] [address] [place] [address] Phone: [phone] Fax: [phone]  [facility] - [place], [place] - [address] [place] [address] Phone: [phone] Fax: [phone]  [facility] - [address], [place] - [address] [place] [address] Phone: [phone] Fax: [phone]     Social Drivers of Health (SDOH) Social History: SDOH Screenings   Food Insecurity:  No Food Insecurity (04/27/2024)  Housing: Low Risk  (04/27/2024)  Transportation Needs: No Transportation Needs (04/28/2024)  Utilities: Not At Risk (04/28/2024)  Financial Resource Strain: Low Risk  (01/17/2023)  Social Connections: Unknown (04/28/2024)  Tobacco Use: Medium Risk (04/28/2024)   SDOH Interventions:     Readmission Risk Interventions     No data to display

## 2024-04-29 NOTE — Progress Notes (Signed)
 PROGRESS NOTE  Amanda [name] JXB:[id number] DOB: 04-08-1933 DOA: 04/26/2024 PCP: Dorena Gander, MD   LOS: 2 days   Brief Narrative / Interim history: 89 y.o. 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  Found to be anemic with a hemoglobin of 5 with positive fecal occult.  GI consulted  Subjective / 24h Interval events: Patient is feeling well this morning, denies any chest pain, denies any shortness of breath.  Daughter is at bedside and tells me she slept well overnight.  Assesement and Plan: Principal problem Symptomatic anemia with concern for upper GI bleed, melena, gastric mass-CT scan October 24 showed thickened appearance of the distal stomach, this apparently was discussed as an outpatient and patient deferred an EGD.  GI consulted, eventually underwent an endoscopy on 6/9 which showed a large infiltrative circumferential mass with no bleeding, involving pylorus, pyloric channel and duodenal bulb.  Biopsies were done and are pending - Underwent a CT scan of the chest abdomen pelvis which showed a 2.0 x 2.3 cm left adrenal nodule, potentially representing metastasis.  No other metastatic disease was identified - Made Dr. Scherrie Curt with oncology aware so that she can get plugged in into the oncology clinic shortly - Surgery consulted, evaluating whether she is a surgical candidate and surgery can be offered. - Unfortunately hemoglobin continues to trend down  Active problems History of DVT/PE-hold anticoagulation for now.  Long discussion with the daughter at bedside this morning, she is off blood thinners and hemoglobin is trending down, for now continue to hold Xarelto .  She agrees   Underlying mild dementia-resume home medications     Essential hypertension-continue home metoprolol   PAD-noted, no current symptoms  Scheduled Meds:  arformoterol   15 mcg Nebulization BID   citalopram   20 mg Oral q morning   cyanocobalamin   1,000 mcg Oral q morning   donepezil   10 mg Oral QHS   ferrous sulfate   325 mg Oral Once per day on Monday Thursday   latanoprost   1 drop Both Eyes QPM   metoprolol  succinate  12.5 mg Oral Daily   pantoprazole  (PROTONIX ) IV  40 mg Intravenous Q12H   revefenacin   175 mcg Nebulization Daily   Continuous Infusions: PRN Meds:.acetaminophen , albuterol , ondansetron  **OR** ondansetron  (ZOFRAN ) IV  Current Outpatient Medications  Medication Instructions   acetaminophen  (TYLENOL ) 650 mg, Oral, Every 6 hours PRN   albuterol  (PROVENTIL ) 2.5 mg, Nebulization, Every 6 hours PRN   arformoterol  (BROVANA ) 15 mcg, Nebulization, 2 times daily   aspirin  EC 81 mg, Oral, Daily   citalopram  (CELEXA ) 20 mg, Oral, Every morning   cyanocobalamin  (VITAMIN B12) 1,000 mcg, Every morning   donepezil  (ARICEPT ) 10 mg, Oral, Daily at bedtime   famotidine (PEPCID) 20 mg, Oral, Daily   ferrous sulfate  325 mg, 2 times weekly   latanoprost  (XALATAN ) 0.005 % ophthalmic solution 1 drop, Every evening   metoprolol  succinate (TOPROL -XL) 25 mg, Oral, Daily   Multiple Vitamins-Minerals (CENTRUM SILVER PO) 1 tablet, Every morning   pantoprazole  (PROTONIX ) 40 mg, Oral, 2 times daily   revefenacin  (YUPELRI ) 175 mcg, Nebulization, Daily   rivaroxaban  (XARELTO ) 20 mg, Oral, Daily with supper    Diet Orders (From admission, onward)     Start     Ordered   04/28/24 1510  Diet regular Room service appropriate? Yes; Fluid consistency: Thin  Diet effective now       Question Answer Comment  Room service appropriate? Yes   Fluid consistency: Thin      04/28/24 1509            DVT prophylaxis: SCDs Start: 04/26/24 1318   Lab Results  Component Value Date   PLT 329 04/29/2024      Code Status: Full Code  Family  Communication: Daughter is at bedside  Status is: Inpatient   Level of care: Telemetry Medical  Consultants:  Gastroenterology  Objective: Vitals:   04/28/24 2006 04/28/24 2009 04/29/24 0423 04/29/24 0754  BP: (!) 140/82  129/60 (!) 153/77  Pulse: 70  61 71  Resp: 17  18 18   Temp: 97.8 F (36.6 C)  98.5 F (36.9 C) 98.3 F (36.8 C)  TempSrc:   Oral   SpO2: 95% 95% 91% 94%  Weight:      Height:        Intake/Output Summary (Last 24 hours) at 04/29/2024 1610 Last data filed at 04/28/2024 2121 Gross per 24 hour  Intake 120 ml  Output --  Net 120 ml   Wt Readings from Last 3 Encounters:  04/26/24 48.1 kg  03/26/24 48.1 kg  12/05/23 48.1 kg    Examination:  Constitutional: NAD Eyes: lids and conjunctivae normal, no scleral icterus ENMT: mmm Neck: normal, supple Respiratory: clear to auscultation bilaterally, no wheezing, no crackles. Normal respiratory effort.  Cardiovascular: Regular rate and rhythm, no murmurs / rubs / gallops. No LE edema. Abdomen: soft, no distention, no tenderness. Bowel sounds positive.   Data Reviewed: I have independently reviewed following labs and imaging studies   CBC Recent Labs  Lab 04/26/24 0827 04/26/24 2137 04/27/24 0657 04/28/24 0437 04/29/24 0411  WBC 6.9 9.4 9.2 8.8 8.2  HGB 5.1* 8.3* 8.3* 7.9* 7.2*  HCT 17.4* 25.4* 25.9* 25.1* 23.3*  PLT 379 321 291 334 329  MCV 93.0 89.4 89.9 90.6 90.7  MCH 27.3 29.2 28.8 28.5 28.0  MCHC 29.3* 32.7 32.0 31.5 30.9  RDW 19.4* 16.2* 16.3* 16.3* 16.4*  LYMPHSABS 1.1  --   --   --   --   MONOABS 0.6  --   --   --   --   EOSABS 0.1  --   --   --   --   BASOSABS 0.0  --   --   --   --     Recent Labs  Lab 04/26/24 0827 04/27/24 0657 04/28/24 0437 04/29/24 0411  NA 138 137 137 135  K 3.5 3.4* 3.7 3.6  CL 105 103 104 102  CO2 26 27 28 27   GLUCOSE 103* 93 100* 98  BUN 15 10 11 10   CREATININE 0.78 0.63 0.77 0.76  CALCIUM 8.7* 8.1* 8.4* 8.0*  AST  --  17  --   --   ALT  --  11   --   --   ALKPHOS  --  24*  --   --   BILITOT  --  0.6  --   --   ALBUMIN  --  2.6*  --   --   MG  --   --  1.9 1.9    ------------------------------------------------------------------------------------------------------------------ No results for input(s): "CHOL", "HDL", "LDLCALC", "TRIG", "CHOLHDL", "LDLDIRECT" in the last 72 hours.  Lab Results  Component Value Date   HGBA1C 6.1 (H) 01/06/2015   ------------------------------------------------------------------------------------------------------------------ No results for input(s): "TSH", "T4TOTAL", "T3FREE", "THYROIDAB" in the last 72 hours.  Invalid input(s): "FREET3"  Cardiac Enzymes No results for input(s): "CKMB", "TROPONINI", "MYOGLOBIN" in the last 168 hours.  Invalid input(s): "CK" ------------------------------------------------------------------------------------------------------------------    Component Value Date/Time   BNP 47.4 12/12/2019 1410    CBG: No results for input(s): "GLUCAP" in the last 168 hours.  No results found for this or any previous visit (from the past 240 hours).   Radiology Studies: CT CHEST ABDOMEN PELVIS W CONTRAST Result Date: 04/28/2024 CLINICAL DATA:  Metastatic disease evaluation EXAM: CT CHEST, ABDOMEN, AND PELVIS WITH CONTRAST TECHNIQUE: Multidetector CT imaging of the chest, abdomen and pelvis was performed following the standard protocol during bolus administration of intravenous contrast. RADIATION DOSE REDUCTION: This exam was performed according to the departmental dose-optimization program which includes automated exposure control, adjustment of the mA and/or kV according to patient size and/or use of iterative reconstruction technique. CONTRAST:  75mL OMNIPAQUE  IOHEXOL  350 MG/ML SOLN COMPARISON:  09/19/2023 FINDINGS: CT CHEST FINDINGS Cardiovascular: Cardiomegaly. No evidence of aortic aneurysm. Aortic atherosclerosis. Mediastinum/Nodes: No mediastinal, hilar, or axillary  adenopathy. Trachea and esophagus are unremarkable. Thyroid  unremarkable. Lungs/Pleura: Mild centrilobular and paraseptal emphysema. Bibasilar dependent atelectasis. No effusions. Musculoskeletal: Chest wall soft tissues are unremarkable. Mild compression through the superior endplate of T8, likely mild chronic compression fracture. CT ABDOMEN PELVIS FINDINGS Hepatobiliary: No focal hepatic abnormality. Gallbladder unremarkable. Pancreas: No focal abnormality or ductal dilatation. Spleen: No focal abnormality.  Normal size. Adrenals/Urinary Tract: Left adrenal mass measures 2.3 x 2.0 cm. Right adrenal gland normal. No renal or ureteral stones. Urinary bladder unremarkable. Stomach/Bowel: Colonic diverticulosis. No active diverticulitis. Moderate stool burden throughout the colon. Stomach and small bowel decompressed. No bowel obstruction or inflammatory process. Vascular/Lymphatic: Diffuse aortoiliac atherosclerosis. No evidence of aneurysm or adenopathy. Reproductive: Prior hysterectomy.  No adnexal masses. Other: No free fluid or free air. Musculoskeletal: No acute bony abnormality. IMPRESSION: Cardiomegaly, aortic atherosclerosis. Bibasilar dependent opacities, likely atelectasis. Prominent central pulmonary arteries may reflect pulmonary arterial hypertension. 2.3 x 2.0 cm left adrenal nodule. Cannot exclude metastasis. This could be further evaluated with adrenal protocol MRI. Colonic diverticulosis.  Moderate stool burden throughout the colon. Electronically Signed   By: Janeece Mechanic M.D.   On: 04/28/2024 20:21     Kathlen Para, MD, PhD [facility]  Between 7 am - 7 pm I am available, please contact me via Amion (for emergencies) or Securechat (non urgent messages)  Between 7 pm - 7 am I am not available, please contact night coverage MD/APP via Amion

## 2024-04-29 NOTE — Consult Note (Addendum)
 Amanda [name] CONSULT NOTE  Patient Care Team: Amanda Gander, MD as PCP - General (Family Medicine)  CHIEF COMPLAINTS/PURPOSE OF CONSULTATION:  Gastric/duodenal mass  REFERRING PHYSICIAN: Dr. Aldona [name]  HISTORY OF PRESENTING ILLNESS:  Amanda [name] 89 y.o. [name] who was admitted on 04/26/2024 with complaints of increasing weakness and dark stools.  Patient had previously seen [facility] and blood work there showed hemoglobin of 5 and patient was directed to come to the ED.  Workup was done including imaging which showed left atrial nodule, could not exclude mets.  Therefore oncology evaluation requested. Patient seen, assessed and [name] today.  She is a very pleasant lady sitting up in chair at bedside eating lunch.  Patient's daughter who is her POA also at bedside and answered many questions.  They note that her recent bowel movements "was loose and not as dark as before".  Denies shortness of breath, chest pain, or other acute pain. Medical history includes hypertension, hyperlipidemia, DVT, and PVC. Surgical history includes partial hysterectomy. Family oncologic history is noncontributory, denies family history of cancer. Social history includes 17 years tobacco use, started around age 3 and quit 52 years ago.  Denies alcohol use.  Denies illicit drug use.  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     I have reviewed her chart and materials related to her cancer extensively and collaborated history with the patient. Summary of oncologic history is as follows: Oncology History   No history exists.    ASSESSMENT & PLAN:  Symptomatic anemia Upper GI bleed Melena - Admitted with concern for upper GI bleed - Hemoglobin upon admission 6/7 was 5.1.  Status post multiple PRBC transfusions which raised hemoglobin to 8.3. - Most recent hemoglobin 6/10 was 7.2 - Recommend PRBC transfusion for Hgb <7.0.  Repeat CBC with differential in AM and transfuse  as needed. - On ferrous sulfate  325 mg p.o. daily - Continue to monitor CBC with differential closely  Duodenal mass Adrenal mass -CT scans done 04/28/2024 showed 2.3 x 2.0 left a adrenal nodule, cannot exclude mets.  Appears depressed on CT 07/22/2021 -Tumor markers done: CEA 1.1, CA 19-9 is 9.  Both WNL - Pathology from duodenal mass and pyloric mass negative for malignancy. - Being followed by GI who document that this large mass cannot be endoscopically removed and will require surgical removal.  However patient was seen by surgery on 6/9 and they deemed her to be a poor surgical candidate.  Consideration for surgical reevaluation in view of pathology results. -Medical oncology/Dr. Scherrie [name] will make further evaluation recommendations.  History of DVT/PE - Was on Xarelto .  Agree with holding at this time due to low blood counts.  Hypertension Hyperlipidemia - Continue antihypertensive meds - Continue to monitor blood pressure    MEDICAL HISTORY:  Past Medical History:  Diagnosis Date   Depression    DVT (deep venous thrombosis) (HCC)    Hyperlipidemia    Hypertension    Memory disorder    Osteoporosis    PVC (premature ventricular contraction)     SURGICAL HISTORY: Past Surgical History:  Procedure Laterality Date   PARTIAL HYSTERECTOMY      SOCIAL HISTORY: Social History   Socioeconomic History   Marital status: Divorced    Spouse name: Not on file   Number of children: Not on file   Years of education: Not on file   Highest education level: Not on file  Occupational History   Not on  file  Tobacco Use   Smoking status: Former    Current packs/day: 0.00    Types: Cigarettes    Start date: 10/29/1954    Quit date: 10/30/1971    Years since quitting: 52.5   Smokeless tobacco: Never   Tobacco comments:    smoked 3-4/day  Substance and Sexual Activity   Alcohol use: No   Drug use: No   Sexual activity: Not on file  Other Topics Concern   Not on file   Social History Narrative   Worked in mills.   Divorced 10    Lives alone   Son lives in town and two daughter lives out of town.       Social Drivers of Corporate investment banker Strain: Low Risk  (01/17/2023)   Overall Financial Resource Strain (CARDIA)    Difficulty of Paying Living Expenses: Not very hard  Food Insecurity: No Food Insecurity (04/27/2024)   Hunger Vital Sign    Worried About Running Out of Food in the Last Year: Never true    Ran Out of Food in the Last Year: Never true  Transportation Needs: No Transportation Needs (04/28/2024)   PRAPARE - Administrator, Civil Service (Medical): No    Lack of Transportation (Non-Medical): No  Physical Activity: Not on file  Stress: Not on file  Social Connections: Unknown (04/28/2024)   Social Connection and Isolation Panel [NHANES]    Frequency of Communication with Friends and Family: Three times a week    Frequency of Social Gatherings with Friends and Family: Twice a week    Attends Religious Services: Patient declined    Database administrator or Organizations: Patient declined    Attends Banker Meetings: 1 to 4 times per year    Marital Status: Not on file  Intimate Partner Violence: Not At Risk (04/27/2024)   Humiliation, Afraid, Rape, and Kick questionnaire    Fear of Current or Ex-Partner: No    Emotionally Abused: No    Physically Abused: No    Sexually Abused: No    FAMILY HISTORY: Family History  Problem Relation Age of Onset   Alzheimer's disease Mother    Lung cancer Neg Hx    Memory loss Neg Hx      PHYSICAL EXAMINATION: ECOG PERFORMANCE STATUS: 2 - Symptomatic, <50% confined to bed  Vitals:   04/29/24 0423 04/29/24 0754  BP: 129/60 (!) 153/77  Pulse: 61 71  Resp: 18 18  Temp: 98.5 F (36.9 C) 98.3 F (36.8 C)  SpO2: 91% 94%   Filed Weights   04/26/24 0821  Weight: 106 lb (48.1 kg)    GENERAL: alert, no distress and comfortable +frail-appearing SKIN: skin color,  texture, turgor are normal, no rashes or significant lesions EYES: normal, conjunctiva are pink and non-injected, sclera clear OROPHARYNX: no exudate, no erythema and lips, buccal mucosa, and tongue normal  NECK: supple, thyroid  normal size, non-tender, without nodularity LYMPH: no palpable lymphadenopathy in the cervical, axillary or inguinal LUNGS: clear to auscultation and percussion with normal breathing effort HEART: regular rate & rhythm and no murmurs and no lower extremity edema ABDOMEN: abdomen soft, non-tender and normal bowel sounds MUSCULOSKELETAL: no cyanosis of digits and no clubbing  PSYCH: alert & oriented x 3 with fluent speech NEURO: no focal motor/sensory deficits   ALLERGIES:  is allergic to memantine .  MEDICATIONS:  Current Facility-Administered Medications  Medication Dose Route Frequency Provider Last Rate Last Admin   acetaminophen  (TYLENOL )  tablet 650 mg  650 mg Oral Q6H PRN Genell Ken, MD       albuterol  (PROVENTIL ) (2.5 MG/3ML) 0.083% nebulizer solution 2.5 mg  2.5 mg Nebulization Q6H PRN Karki, Arya, MD   2.5 mg at 04/27/24 2022   arformoterol  (BROVANA ) nebulizer solution 15 mcg  15 mcg Nebulization BID Karki, Arya, MD   15 mcg at 04/29/24 1478   citalopram  (CELEXA ) tablet 20 mg  20 mg Oral q morning Karki, Arya, MD   20 mg at 04/29/24 2956   cyanocobalamin  (VITAMIN B12) tablet 1,000 mcg  1,000 mcg Oral q morning Genell Ken, MD   1,000 mcg at 04/29/24 2130   donepezil  (ARICEPT ) tablet 10 mg  10 mg Oral QHS Karki, Arya, MD   10 mg at 04/28/24 2121   ferrous sulfate  tablet 325 mg  325 mg Oral Once per day on Monday Thursday Genell Ken, MD   325 mg at 04/28/24 1013   latanoprost  (XALATAN ) 0.005 % ophthalmic solution 1 drop  1 drop Both Eyes QPM Genell Ken, MD   1 drop at 04/28/24 1947   metoprolol  succinate (TOPROL -XL) 24 hr tablet 12.5 mg  12.5 mg Oral Daily Genell Ken, MD   12.5 mg at 04/29/24 8657   ondansetron  (ZOFRAN ) tablet 4 mg  4 mg Oral Q6H PRN Genell Ken, MD       Or   ondansetron  (ZOFRAN ) injection 4 mg  4 mg Intravenous Q6H PRN Genell Ken, MD       pantoprazole  (PROTONIX ) injection 40 mg  40 mg Intravenous Q12H Genell Ken, MD   40 mg at 04/29/24 0944   revefenacin  (YUPELRI ) nebulizer solution 175 mcg  175 mcg Nebulization Daily Genell Ken, MD   175 mcg at 04/29/24 0904     LABORATORY DATA:  I have reviewed the data as listed Lab Results  Component Value Date   WBC 8.2 04/29/2024   HGB 7.2 (L) 04/29/2024   HCT 23.3 (L) 04/29/2024   MCV 90.7 04/29/2024   PLT 329 04/29/2024   Recent Labs    04/27/24 0657 04/28/24 0437 04/29/24 0411  NA 137 137 135  K 3.4* 3.7 3.6  CL 103 104 102  CO2 27 28 27   GLUCOSE 93 100* 98  BUN 10 11 10   CREATININE 0.63 0.77 0.76  CALCIUM 8.1* 8.4* 8.0*  GFRNONAA >60 >60 >60  PROT 5.1*  --   --   ALBUMIN 2.6*  --   --   AST 17  --   --   ALT 11  --   --   ALKPHOS 24*  --   --   BILITOT 0.6  --   --     RADIOGRAPHIC STUDIES: I have personally reviewed the radiological images as listed and agreed with the findings in the report. CT CHEST ABDOMEN PELVIS W CONTRAST Result Date: 04/28/2024 CLINICAL DATA:  Metastatic disease evaluation EXAM: CT CHEST, ABDOMEN, AND PELVIS WITH CONTRAST TECHNIQUE: Multidetector CT imaging of the chest, abdomen and pelvis was performed following the standard protocol during bolus administration of intravenous contrast. RADIATION DOSE REDUCTION: This exam was performed according to the departmental dose-optimization program which includes automated exposure control, adjustment of the mA and/or kV according to patient size and/or use of iterative reconstruction technique. CONTRAST:  75mL OMNIPAQUE  IOHEXOL  350 MG/ML SOLN COMPARISON:  09/19/2023 FINDINGS: CT CHEST FINDINGS Cardiovascular: Cardiomegaly. No evidence of aortic aneurysm. Aortic atherosclerosis. Mediastinum/Nodes: No mediastinal, hilar, or axillary adenopathy. Trachea and esophagus are unremarkable. Thyroid   unremarkable. Lungs/Pleura: Mild centrilobular and paraseptal emphysema. Bibasilar dependent atelectasis. No effusions. Musculoskeletal: Chest wall soft tissues are unremarkable. Mild compression through the superior endplate of T8, likely mild chronic compression fracture. CT ABDOMEN PELVIS FINDINGS Hepatobiliary: No focal hepatic abnormality. Gallbladder unremarkable. Pancreas: No focal abnormality or ductal dilatation. Spleen: No focal abnormality.  Normal size. Adrenals/Urinary Tract: Left adrenal mass measures 2.3 x 2.0 cm. Right adrenal gland normal. No renal or ureteral stones. Urinary bladder unremarkable. Stomach/Bowel: Colonic diverticulosis. No active diverticulitis. Moderate stool burden throughout the colon. Stomach and small bowel decompressed. No bowel obstruction or inflammatory process. Vascular/Lymphatic: Diffuse aortoiliac atherosclerosis. No evidence of aneurysm or adenopathy. Reproductive: Prior hysterectomy.  No adnexal masses. Other: No free fluid or free air. Musculoskeletal: No acute bony abnormality. IMPRESSION: Cardiomegaly, aortic atherosclerosis. Bibasilar dependent opacities, likely atelectasis. Prominent central pulmonary arteries may reflect pulmonary arterial hypertension. 2.3 x 2.0 cm left adrenal nodule. Cannot exclude metastasis. This could be further evaluated with adrenal protocol MRI. Colonic diverticulosis.  Moderate stool burden throughout the colon. Electronically Signed   By: Janeece Mechanic M.D.   On: 04/28/2024 20:21     The total time spent in the appointment was 55 minutes encounter with patients including review of chart and various tests results, discussions about plan of care and coordination of care plan   All questions were answered. The patient knows to call the clinic with any problems, questions or concerns. No barriers to learning was detected.  Jacqualin Mate, NP 6/10/20252:06 PM Amanda [name].  She was admitted 04/26/2024 with  severe symptomatic anemia.  She was found to have iron deficiency anemia and dark Hemoccult positive stool.  Upper endoscopy confirmed a mass at the pylorus/duodenum.  Biopsies from the stomach and duodenum are consistent with an adenoma.  I discussed the clinical presentation and apparent diagnosis with Amanda [name].  They understand the mass may be benign, but an underlying malignancy remains possible.  She does not appear to be a surgical candidate.   We can consider repeat biopsies to rule out an underlying carcinoma.  If negative treatment will most likely be supportive care.  If she is confirmed to have adenocarcinoma we can consider radiation and systemic therapy.  I reviewed the CT images.  The left adrenal lesion was present on a CT in 2022 and is likely benign.  I believe I can visualize an intraluminal mass in the proximal duodenum on CT.  She has a history of pulmonary embolism in January 2024 and was maintained on rivaroxaban  anticoagulation prior to hospital admission.  Anticoagulation is currently on hold.  We will need to consider the risk/benefit of resuming anticoagulation therapy in the setting of a gastric/duodenal mass and anemia.  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   Iron deficiency anemia secondary to #1, status post 2 units packed red blood cells 04/26/2024 3.   Mild dementia 4.   Hypertension 5.   History of DVT/pulmonary embolism, maintained on rivaroxaban  prior to hospital admission Pulmonary embolism 12/02/2022, indefinite anticoagulation recommended 6.   History of depression 7.   COPD 8.   Hyperlipidemia  Recommendations: GI consultation to consider a repeat endoscopy with multiple biopsies to rule out underlying carcinoma Requested repeat reading on the 04/28/2024 abdomen  CT to document chronicity of left adrenal mass and evaluate for a duodenal mass Hold anticoagulation  Continue iron, increase to twice daily dosing Oncology will follow her in the hospital and outpatient follow-up will be scheduled at the cancer [name]

## 2024-04-29 NOTE — Progress Notes (Signed)
 FINAL MICROSCOPIC DIAGNOSIS:   A. 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  - Negative for high-grade dysplasia and malignancy.   B. PYLORIC MASS, BIOPSY:  - Duodenal intestinal-type adenoma.  - Background edematous enteric-type mucosa.  - Negative for high-grade dysplasia and malignancy.   This is a large mass, endoscopically cannot be removed, will require surgical removal.

## 2024-04-30 ENCOUNTER — Encounter (HOSPITAL_COMMUNITY): Payer: Self-pay | Admitting: Gastroenterology

## 2024-04-30 DIAGNOSIS — D649 Anemia, unspecified: Secondary | ICD-10-CM | POA: Diagnosis not present

## 2024-04-30 DIAGNOSIS — I808 Phlebitis and thrombophlebitis of other sites: Secondary | ICD-10-CM | POA: Diagnosis not present

## 2024-04-30 DIAGNOSIS — K922 Gastrointestinal hemorrhage, unspecified: Secondary | ICD-10-CM

## 2024-04-30 DIAGNOSIS — K319 Disease of stomach and duodenum, unspecified: Secondary | ICD-10-CM | POA: Diagnosis not present

## 2024-04-30 DIAGNOSIS — E279 Disorder of adrenal gland, unspecified: Secondary | ICD-10-CM | POA: Diagnosis not present

## 2024-04-30 DIAGNOSIS — D509 Iron deficiency anemia, unspecified: Secondary | ICD-10-CM | POA: Diagnosis not present

## 2024-04-30 DIAGNOSIS — Z87891 Personal history of nicotine dependence: Secondary | ICD-10-CM

## 2024-04-30 LAB — CBC
HCT: 23.3 % — ABNORMAL LOW (ref 36.0–46.0)
Hemoglobin: 7.1 g/dL — ABNORMAL LOW (ref 12.0–15.0)
MCH: 28 pg (ref 26.0–34.0)
MCHC: 30.5 g/dL (ref 30.0–36.0)
MCV: 91.7 fL (ref 80.0–100.0)
Platelets: 347 10*3/uL (ref 150–400)
RBC: 2.54 MIL/uL — ABNORMAL LOW (ref 3.87–5.11)
RDW: 16.4 % — ABNORMAL HIGH (ref 11.5–15.5)
WBC: 6.5 10*3/uL (ref 4.0–10.5)
nRBC: 0 % (ref 0.0–0.2)

## 2024-04-30 LAB — PREPARE RBC (CROSSMATCH)

## 2024-04-30 MED ORDER — SODIUM CHLORIDE 0.9% IV SOLUTION: Freq: Once | INTRAVENOUS | Status: AC

## 2024-04-30 NOTE — H&P (View-Only) (Signed)
 Spoke with patient's daughter Amanda [name] over the phone, [phone].  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  Discussed with the patient that Dr. Sharalyn Dasen from oncology is requesting repeat endoscopy with multiple biopsies to rule out underlying carcinoma.  She is agreeable for repeat endoscopy. She understands that endoscopy is being done for more biopsies, the lesion noted in pyloric channel and duodenal bulb is too large for endoscopic removal. I discussed with her that if these biopsies also do not show malignancy, I would not recommend further EGD and biopsies.  Patient has been evaluated by surgical team and not deemed a surgical candidate for partial gastrectomy.  I will keep her n.p.o. postmidnight and plan EGD tomorrow in a.m.Aaron Aas

## 2024-04-30 NOTE — Care Management Important Message (Signed)
 Important Message  Patient Details  Name: Amanda [name] MRN: [id number] Date of Birth: [birth year]   Important Message Given:        Wynonia Hedges 04/30/2024, 11:29 AM

## 2024-04-30 NOTE — Hospital Course (Signed)
 89 y.o. 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  Found to be anemic with a hemoglobin of 5 with positive fecal occult.  GI consulted

## 2024-04-30 NOTE — Progress Notes (Signed)
 Spoke with patient's daughter Agustin Aldo over the phone, [phone].  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  Discussed with the patient that Dr. Sharalyn Dasen from oncology is requesting repeat endoscopy with multiple biopsies to rule out underlying carcinoma.  She is agreeable for repeat endoscopy. She understands that endoscopy is being done for more biopsies, the lesion noted in pyloric channel and duodenal bulb is too large for endoscopic removal. I discussed with her that if these biopsies also do not show malignancy, I would not recommend further EGD and biopsies.  Patient has been evaluated by surgical team and not deemed a surgical candidate for partial gastrectomy.  I will keep her n.p.o. postmidnight and plan EGD tomorrow in a.m.Aaron Aas

## 2024-04-30 NOTE — Progress Notes (Signed)
 IP PROGRESS NOTE  Subjective:   Ms. Vegh appears unchanged.  No new complaint.  Her daughter is at the bedside.  Objective: Vital signs in last 24 hours: Blood pressure (!) 159/71, pulse 64, temperature 98.1 F (36.7 C), resp. rate 19, height 5' (1.524 m), weight 106 lb (48.1 kg), SpO2 90%.  Intake/Output from previous day: No intake/output data recorded.  Physical Exam:   Abdomen: Soft and nontender Extremities: Tenderness with a palpable cord proximal to the left antecubital fossa, mild associated induration, no arm swelling   Lab Results: Recent Labs    04/29/24 0411 04/30/24 0427  WBC 8.2 6.5  HGB 7.2* 7.1*  HCT 23.3* 23.3*  PLT 329 347    BMET Recent Labs    04/28/24 0437 04/29/24 0411  NA 137 135  K 3.7 3.6  CL 104 102  CO2 28 27  GLUCOSE 100* 98  BUN 11 10  CREATININE 0.77 0.76  CALCIUM 8.4* 8.0*    Lab Results  Component Value Date   CEA1 1.1 04/28/2024   [id number] 9 04/28/2024    Studies/Results: CT CHEST ABDOMEN PELVIS W CONTRAST Result Date: 04/28/2024 CLINICAL DATA:  Metastatic disease evaluation EXAM: CT CHEST, ABDOMEN, AND PELVIS WITH CONTRAST TECHNIQUE: Multidetector CT imaging of the chest, abdomen and pelvis was performed following the standard protocol during bolus administration of intravenous contrast. RADIATION DOSE REDUCTION: This exam was performed according to the departmental dose-optimization program which includes automated exposure control, adjustment of the mA and/or kV according to patient size and/or use of iterative reconstruction technique. CONTRAST:  75mL OMNIPAQUE  IOHEXOL  350 MG/ML SOLN COMPARISON:  09/19/2023 FINDINGS: CT CHEST FINDINGS Cardiovascular: Cardiomegaly. No evidence of aortic aneurysm. Aortic atherosclerosis. Mediastinum/Nodes: No mediastinal, hilar, or axillary adenopathy. Trachea and esophagus are unremarkable. Thyroid  unremarkable. Lungs/Pleura: Mild centrilobular and paraseptal emphysema. Bibasilar dependent  atelectasis. No effusions. Musculoskeletal: Chest wall soft tissues are unremarkable. Mild compression through the superior endplate of T8, likely mild chronic compression fracture. CT ABDOMEN PELVIS FINDINGS Hepatobiliary: No focal hepatic abnormality. Gallbladder unremarkable. Pancreas: No focal abnormality or ductal dilatation. Spleen: No focal abnormality.  Normal size. Adrenals/Urinary Tract: Left adrenal mass measures 2.3 x 2.0 cm. Right adrenal gland normal. No renal or ureteral stones. Urinary bladder unremarkable. Stomach/Bowel: Colonic diverticulosis. No active diverticulitis. Moderate stool burden throughout the colon. Stomach and small bowel decompressed. No bowel obstruction or inflammatory process. Vascular/Lymphatic: Diffuse aortoiliac atherosclerosis. No evidence of aneurysm or adenopathy. Reproductive: Prior hysterectomy.  No adnexal masses. Other: No free fluid or free air. Musculoskeletal: No acute bony abnormality. IMPRESSION: Cardiomegaly, aortic atherosclerosis. Bibasilar dependent opacities, likely atelectasis. Prominent central pulmonary arteries may reflect pulmonary arterial hypertension. 2.3 x 2.0 cm left adrenal nodule. Cannot exclude metastasis. This could be further evaluated with adrenal protocol MRI. Colonic diverticulosis.  Moderate stool burden throughout the colon. Electronically Signed   By: Janeece Mechanic M.D.   On: 04/28/2024 20:21    Medications: I have reviewed the patient's current medications.  Assessment/Plan:  Pyloric/proximal duodenal mass EGD 04/28/2024: Villous mass at the pylorus and duodenal bulb-adenoma CT chest/abdomen/pelvis 04/28/2024: Emphysema, mild T8 compression fracture, left adrenal mass 2.   Iron deficiency anemia secondary to #1, status post 2 units packed red blood cells 04/26/2024 3.   Mild dementia 4.   Hypertension 5.   History of DVT/pulmonary embolism, maintained on rivaroxaban  prior to hospital admission Pulmonary embolism 12/02/2022,  indefinite anticoagulation recommended 6.   History of depression 7.   COPD 8.   Hyperlipidemia 9.   Left  arm phlebitis associated with an IV   Ms. Cosio appears unchanged.  I had further discussions with her daughters this morning.  We discussed the gastric/duodenal mass.  This may be a benign lesion or there could be an underlying carcinoma.  I recommend a repeat EGD/biopsy if gastroenterology is in agreement.  If the lesion is again benign I will recommend observation.  She has iron deficiency anemia.  The hemoglobin remains low.  She began iron replacement therapy.  Hopefully the anemia will correct with iron and discontinuation of rivaroxaban .  Ms. Bogart had a pulmonary embolism in 2024.  Her family report she had a lower extremity DVT in approximately 2021 (I can find no documentation of this).  We will contact her primary care provider's office for records.  I recommend holding rivaroxaban  given the risk of bleeding with the gastric/duodenal mass.  The family understands the risk for recurrent venous thromboembolic disease.  Recommendations: Transfuse 1 unit packed red blood cells Continue iron therapy Repeat biopsy of the gastric/duodenal mass Hold anticoagulation Warm compresses to the left arm phlebitis Repeat CBC as an outpatient in approximately 1 week Outpatient follow-up will be scheduled at the Cancer center in 2 weeks     LOS: 3 days   Coni Deep, MD   04/30/2024, 8:25 AM

## 2024-04-30 NOTE — Plan of Care (Signed)

## 2024-04-30 NOTE — Anesthesia Preprocedure Evaluation (Addendum)
 Anesthesia Evaluation  Patient identified by MRN, date of birth, ID band Patient awake    Reviewed: Allergy & Precautions, NPO status , Patient's Chart, lab work & pertinent test results  History of Anesthesia Complications Negative for: history of anesthetic complications  Airway Mallampati: III  TM Distance: >3 FB Neck ROM: Full    Dental  (+) Edentulous Upper, Edentulous Lower   Pulmonary COPD,  COPD inhaler, former smoker, PE Nodule    Pulmonary exam normal breath sounds clear to auscultation       Cardiovascular hypertension, Pt. on home beta blockers + Peripheral Vascular Disease and + DVT  + dysrhythmias (PVCs, trigeminy) + Valvular Problems/Murmurs (mild) AI  Rhythm:Regular Rate:Normal  HLD  TTE 12/02/2022: IMPRESSIONS    1. Low normal to mildy reduced LV function; EF 50.   2. Left ventricular ejection fraction, by estimation, is 50 to 55%. The  left ventricle has low normal function. The left ventricle has no regional  wall motion abnormalities. Left ventricular diastolic parameters are  consistent with Grade I diastolic  dysfunction (impaired relaxation).   3. Right ventricular systolic function is normal. The right ventricular  size is normal. There is normal pulmonary artery systolic pressure.   4. The mitral valve is normal in structure. Trivial mitral valve  regurgitation. No evidence of mitral stenosis.   5. The aortic valve is tricuspid. Aortic valve regurgitation is mild.  Aortic valve sclerosis is present, with no evidence of aortic valve  stenosis.   6. The inferior vena cava is normal in size with greater than 50%  respiratory variability, suggesting right atrial pressure of 3 mmHg.   Normal stress test 12/30/2019    Neuro/Psych  Headaches PSYCHIATRIC DISORDERS  Depression   Dementia    GI/Hepatic Neg liver ROS,GERD  Medicated,,  Endo/Other  negative endocrine ROS    Renal/GU negative Renal ROS   negative genitourinary   Musculoskeletal  (+) Arthritis ,  Osteoporosis    Abdominal   Peds  Hematology  (+) Blood dyscrasia, anemia Lab Results      Component                Value               Date                      WBC                      9.2                 04/27/2024                HGB                      8.3 (L)             04/27/2024                HCT                      25.9 (L)            04/27/2024                MCV                      89.9  04/27/2024                PLT                      291                 04/27/2024              Anesthesia Other Findings   Reproductive/Obstetrics                              Anesthesia Physical Anesthesia Plan  ASA: 3  Anesthesia Plan: MAC   Post-op Pain Management: Minimal or no pain anticipated   Induction: Intravenous  PONV Risk Score and Plan: 2 and Propofol infusion, TIVA and Treatment may vary due to age or medical condition  Airway Management Planned: Simple Face Mask and Nasal Cannula  Additional Equipment: None  Intra-op Plan:   Post-operative Plan:   Informed Consent: I have reviewed the patients History and Physical, chart, labs and discussed the procedure including the risks, benefits and alternatives for the proposed anesthesia with the patient or authorized representative who has indicated his/her understanding and acceptance.     Dental advisory given  Plan Discussed with: CRNA and Anesthesiologist  Anesthesia Plan Comments: (Discussed with patient risks of MAC including, but not limited to, minor pain or discomfort, hearing people in the room, and possible need for backup general anesthesia. Risks for general anesthesia also discussed including, but not limited to, sore throat, hoarse voice, chipped/damaged teeth, injury to vocal cords, nausea and vomiting, allergic reactions, lung infection, heart attack, stroke, and death. All questions answered. )        Anesthesia Quick Evaluation

## 2024-04-30 NOTE — Progress Notes (Signed)
  Progress Note   Patient: Amanda [name] UJW:[id number] DOB: [birth year] DOA: 04/26/2024     3 DOS: the patient was seen and examined on 04/30/2024   Brief hospital course: 89 y.o. 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  Found to be anemic with a hemoglobin of 5 with positive fecal occult.  GI consulted   Assessment and Plan: Principal problem Symptomatic anemia with concern for upper GI bleed, melena, gastric mass-CT scan October 24 showed thickened appearance of the distal stomach, this apparently was discussed as an outpatient and patient deferred an EGD.  GI consulted, eventually underwent an endoscopy on 6/9 which showed a large infiltrative circumferential mass with no bleeding, involving pylorus, pyloric channel and duodenal bulb.  Biopsies neg for high grade dysplasia and malignancy - Underwent a CT scan of the chest abdomen pelvis which showed a 2.0 x 2.3 cm left adrenal nodule, potentially representing metastasis.  No other metastatic disease was identified - Dr. Scherrie Curt following. Recs for EGD with biopsy tomorrow. Planned - Hgb is down to 7.1. Ordered 1 unit PRBC per Oncology recs   Active problems History of DVT/PE-hold anticoagulation for now.  Dr. Aldona Amel had a long discussion with the daughter at bedside. She is currently off blood thinners  -Transfuse as per above   Underlying mild dementia-resume home medications    Essential hypertension-continue home metoprolol    PAD-noted, no current symptoms   Subjective: Without complaints. Family reports pt has been having LE weakness  Physical Exam: Vitals:   04/30/24 0352 04/30/24 0833 04/30/24 0854 04/30/24 1503  BP: (!) 159/71 (!) 148/69  115/64  Pulse: 64 63  67  Resp: 19  18  16   Temp: 98.1 F (36.7 C) 98.5 F (36.9 C)  98.2 F (36.8 C)  TempSrc:      SpO2: 90% 93% 97% 96%  Weight:      Height:       General exam: Awake, laying in bed, in nad Respiratory system: Normal respiratory effort, no wheezing Cardiovascular system: regular rate, s1, s2 Gastrointestinal system: Soft, nondistended, positive BS Central nervous system: CN2-12 grossly intact, strength intact Extremities: Perfused, no clubbing Skin: Normal skin turgor, no notable skin lesions seen Psychiatry: Mood normal // no visual hallucinations   Data Reviewed:  Labs reviewed: WBC 6.5, Hgb 7.1, Plts 347  Family Communication: Pt in room, pt's daughter at bedside  Disposition: Status is: Inpatient Remains inpatient appropriate because: severity of illness  Planned Discharge Destination: Home    Author: Cherylle Corwin, MD 04/30/2024 5:38 PM  For on call review [email or web address].

## 2024-05-01 ENCOUNTER — Other Ambulatory Visit: Payer: Self-pay | Admitting: *Deleted

## 2024-05-01 ENCOUNTER — Encounter: Payer: Self-pay | Admitting: *Deleted

## 2024-05-01 ENCOUNTER — Inpatient Hospital Stay (HOSPITAL_COMMUNITY): Admitting: Anesthesiology

## 2024-05-01 ENCOUNTER — Encounter (HOSPITAL_COMMUNITY): Payer: Self-pay | Admitting: Internal Medicine

## 2024-05-01 ENCOUNTER — Encounter (HOSPITAL_COMMUNITY)
Admission: EM | Disposition: A | Discharge status: Home / Self Care | Payer: Self-pay | Source: Home / Self Care | Attending: Internal Medicine

## 2024-05-01 DIAGNOSIS — I808 Phlebitis and thrombophlebitis of other sites: Secondary | ICD-10-CM | POA: Diagnosis not present

## 2024-05-01 DIAGNOSIS — D509 Iron deficiency anemia, unspecified: Secondary | ICD-10-CM | POA: Diagnosis not present

## 2024-05-01 DIAGNOSIS — D649 Anemia, unspecified: Secondary | ICD-10-CM

## 2024-05-01 DIAGNOSIS — D490 Neoplasm of unspecified behavior of digestive system: Secondary | ICD-10-CM

## 2024-05-01 DIAGNOSIS — I10 Essential (primary) hypertension: Secondary | ICD-10-CM | POA: Diagnosis not present

## 2024-05-01 DIAGNOSIS — K3189 Other diseases of stomach and duodenum: Secondary | ICD-10-CM

## 2024-05-01 DIAGNOSIS — K319 Disease of stomach and duodenum, unspecified: Secondary | ICD-10-CM | POA: Diagnosis not present

## 2024-05-01 DIAGNOSIS — E785 Hyperlipidemia, unspecified: Secondary | ICD-10-CM

## 2024-05-01 DIAGNOSIS — E279 Disorder of adrenal gland, unspecified: Secondary | ICD-10-CM | POA: Diagnosis not present

## 2024-05-01 DIAGNOSIS — K922 Gastrointestinal hemorrhage, unspecified: Secondary | ICD-10-CM | POA: Diagnosis not present

## 2024-05-01 HISTORY — PX: ESOPHAGOGASTRODUODENOSCOPY: SHX5428

## 2024-05-01 LAB — TYPE AND SCREEN
ABO/RH(D): AB POS
Antibody Screen: NEGATIVE
Unit division: 0

## 2024-05-01 LAB — COMPREHENSIVE METABOLIC PANEL WITH GFR
ALT: 14 U/L (ref 0–44)
AST: 20 U/L (ref 15–41)
Albumin: 2.7 g/dL — ABNORMAL LOW (ref 3.5–5.0)
Alkaline Phosphatase: 38 U/L (ref 38–126)
Anion gap: 7 (ref 5–15)
BUN: 10 mg/dL (ref 8–23)
CO2: 26 mmol/L (ref 22–32)
Calcium: 8.4 mg/dL — ABNORMAL LOW (ref 8.9–10.3)
Chloride: 101 mmol/L (ref 98–111)
Creatinine, Ser: 0.7 mg/dL (ref 0.44–1.00)
GFR, Estimated: >60 mL/min (ref >60)
Glucose, Bld: 92 mg/dL (ref 70–99)
Potassium: 3.9 mmol/L (ref 3.5–5.1)
Sodium: 134 mmol/L — ABNORMAL LOW (ref 135–145)
Total Bilirubin: 0.8 mg/dL (ref 0.0–1.2)
Total Protein: 5.3 g/dL — ABNORMAL LOW (ref 6.5–8.1)

## 2024-05-01 LAB — BPAM RBC
Blood Product Expiration Date: 202506272359
ISSUE DATE / TIME: 202506112039
Unit Type and Rh: 6200

## 2024-05-01 LAB — CBC
HCT: 28.8 % — ABNORMAL LOW (ref 36.0–46.0)
Hemoglobin: 9.3 g/dL — ABNORMAL LOW (ref 12.0–15.0)
MCH: 29.4 pg (ref 26.0–34.0)
MCHC: 32.3 g/dL (ref 30.0–36.0)
MCV: 91.1 fL (ref 80.0–100.0)
Platelets: 362 10*3/uL (ref 150–400)
RBC: 3.16 MIL/uL — ABNORMAL LOW (ref 3.87–5.11)
RDW: 15.3 % (ref 11.5–15.5)
WBC: 8.3 10*3/uL (ref 4.0–10.5)
nRBC: 0 % (ref 0.0–0.2)

## 2024-05-01 SURGERY — EGD (ESOPHAGOGASTRODUODENOSCOPY)
Anesthesia: Monitor Anesthesia Care

## 2024-05-01 MED ORDER — LIDOCAINE 2% (20 MG/ML) 5 ML SYRINGE
INTRAMUSCULAR | Status: DC | PRN
  Administered 2024-05-01: 40 mg via INTRAVENOUS

## 2024-05-01 MED ORDER — PANTOPRAZOLE SODIUM 40 MG PO TBEC: 40.0000 mg | DELAYED_RELEASE_TABLET | Freq: Every day | ORAL | Status: DC

## 2024-05-01 MED ORDER — PROPOFOL 10 MG/ML IV BOLUS
INTRAVENOUS | Status: DC | PRN
  Administered 2024-05-01: 50 mg via INTRAVENOUS

## 2024-05-01 MED ORDER — METOPROLOL SUCCINATE ER 25 MG PO TB24: 12.5000 mg | ORAL_TABLET | Freq: Every day | ORAL | 0 refills | Status: DC

## 2024-05-01 MED ORDER — LACTATED RINGERS IV SOLN
INTRAVENOUS | Status: AC | PRN
  Administered 2024-05-01: 1000 mL via INTRAVENOUS

## 2024-05-01 MED ORDER — SODIUM CHLORIDE 0.9 % IV SOLN: INTRAVENOUS | Status: DC

## 2024-05-01 MED ORDER — FERROUS SULFATE 325 (65 FE) MG PO TABS
325.0000 mg | ORAL_TABLET | Freq: Two times a day (BID) | ORAL | Status: AC
Start: 2024-05-01 — End: ?

## 2024-05-01 MED ORDER — PROPOFOL 500 MG/50ML IV EMUL
INTRAVENOUS | Status: DC | PRN
  Administered 2024-05-01: 120 ug/kg/min via INTRAVENOUS

## 2024-05-01 NOTE — TOC Transition Note (Addendum)
 Transition of Care [facility]) - Discharge Note   Patient Details  Name: Amanda [name] MRN: [id number] Date of Birth: [birth year]  [facility] CM/SW Contact:  Ronni Colace, RN Phone Number: 05/01/2024, 3:47 PM   Clinical Narrative:    Patient transitioning home today, called [facility] to accept for PT, this who they wanted to service them. They had [facility] previously.  Orders in patient discharging [address] from [facility] messaged back and cannot take at this time. Reached out to [place] from [facility] . for acceptance Left a message for Ms Martina Sledge about this.  [address] [facility] accepted for PT they will call and set up services Final next level of care: Home w Home Health Services Barriers to Discharge: No Barriers Identified   Patient Goals and CMS Choice Patient states their goals for this hospitalization and ongoing recovery are:: to go home          Discharge Placement                       Discharge Plan and Services Additional resources added to the After Visit Summary for     Discharge Planning Services: CM Consult                      HH Arranged: PT [facility] Agency: [facility] Date [facility] Agency Contacted: 05/01/24 Time HH Agency Contacted: [phone] Representative spoke with at [facility] Agency: Imelda Man  Social Drivers of Health (SDOH) Interventions SDOH Screenings   Food Insecurity: No Food Insecurity (04/27/2024)  Housing: Low Risk  (04/27/2024)  Transportation Needs: No Transportation Needs (04/28/2024)  Utilities: Not At Risk (04/28/2024)  Financial Resource Strain: Low Risk  (01/17/2023)  Social Connections: Unknown (04/28/2024)  Tobacco Use: Medium Risk (05/01/2024)     Readmission Risk Interventions     No data to display

## 2024-05-01 NOTE — Progress Notes (Addendum)
 IP PROGRESS NOTE  Subjective:   Amanda [name] appears unchanged.  No new complaint.  Her daughter is at the bedside.  Objective: Vital signs in last 24 hours: Blood pressure (!) 162/89, pulse (!) 54, temperature 98.6 F (37 C), resp. rate (!) 22, height 5' (1.524 m), weight 106 lb (48.1 kg), SpO2 95%.  Intake/Output from previous day: 06/11 0701 - 06/12 0700 In: 791.7 [I.V.:125; Blood:666.7] Out: -   Physical Exam:   Abdomen: Soft and nontender, no mass Extremities: Mild tenderness with mild induration at the left arm superior to the antecubital fossa, no leg edema   Lab Results: Recent Labs    04/30/24 0427 05/01/24 0454  WBC 6.5 8.3  HGB 7.1* 9.3*  HCT 23.3* 28.8*  PLT 347 362    BMET Recent Labs    04/29/24 0411 05/01/24 0454  NA 135 134*  K 3.6 3.9  CL 102 101  CO2 27 26  GLUCOSE 98 92  BUN 10 10  CREATININE 0.76 0.70  CALCIUM 8.0* 8.4*    Lab Results  Component Value Date   CEA1 1.1 04/28/2024   [id number] 9 04/28/2024     Medications: I have reviewed the patient's current medications.  Assessment/Plan:  Pyloric/proximal duodenal mass EGD 04/28/2024: Villous mass at the pylorus and duodenal bulb-adenoma CT chest/abdomen/pelvis 04/28/2024: Emphysema, mild T8 compression fracture, left adrenal mass 2.   Iron deficiency anemia secondary to #1, status post 2 units packed red blood cells 04/26/2024, 1 unit packed red cells 04/30/2024 3.   Mild dementia 4.   Hypertension 5.   History of DVT/pulmonary embolism, maintained on rivaroxaban  prior to hospital admission Pulmonary embolism 12/02/2022, indefinite anticoagulation recommended Left leg DVT November 2021, Doppler 11/24/2019-[facility], thrombus in a calf vein tributary of the popliteal, deep venous system patent, Xarelto  (family reports approximately 6 weeks of anticoagulation therapy) 6.   History of depression 7.   COPD 8.   Hyperlipidemia 9.   Left arm phlebitis associated with an IV   Amanda [name] appears  unchanged.  She is scheduled for a repeat upper endoscopy today.  The hemoglobin is higher following the Red cell transfusion yesterday.   Amanda [name] had a pulmonary embolism in January 2024.  She was diagnosed with a left calf thrombus and a tributary of the popliteal vein and November 2021.  She was placed on indefinite Xarelto  anticoagulation after the pulmonary embolism last year.  I recommend discontinuing anticoagulation therapy given the current admission with bleeding and iron deficiency anemia.   Recommendations: Continue iron therapy Repeat biopsy of the gastric/duodenal mass today Hold anticoagulation Warm compresses to the left arm phlebitis Repeat CBC as an outpatient in approximately 1 week-scheduled Outpatient follow-up will be scheduled at the Cancer center in 2 weeks-scheduled Continue Protonix      LOS: 4 days   Coni Deep, MD   05/01/2024, 10:28 AM

## 2024-05-01 NOTE — Evaluation (Signed)
 Occupational Therapy Evaluation Patient Details Name: Amanda [name] MRN: [id number] DOB: 14-Feb-1933 Today's Date: 05/01/2024   History of Present Illness   Pt is an 89yo female admitted for weakness, increased confusion and dark stools. 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 Pt lives alone, has strong family support, cameras installed in house, daily phone calls to check on wellness, family can stop by as needed to assist. PLOF independent in home for ADLs/IADLs, assist with groceries from family and community activities. Pt currently likely at baseline, completed ADLs mod I, supervision for safety with ambulation with/without RW for support, ambulated 150 ft. Pt has no acute OT needs, no follow up OT needs, family asked for [facility] for safety as she wakes up and has urgent needs for restroom, would benefit from [facility] to minimize fall risk from rushing to bathroom at nighttime.      If plan is discharge home, recommend the following:   Assist for transportation     Functional Status Assessment   Patient has had a recent decline in their functional status and demonstrates the ability to make significant improvements in function in a reasonable and predictable amount of time.     Equipment Recommendations   BSC/3in1     Recommendations for Other Services         Precautions/Restrictions   Precautions Precautions: Fall Restrictions Weight Bearing Restrictions Per Provider Order: No     Mobility Bed Mobility               General bed mobility comments: in recliner    Transfers Overall transfer level: Needs assistance Equipment used: Rolling walker (2 wheels) Transfers: Sit to/from Stand Sit to Stand: Supervision            General transfer comment: supervision for safety, likely at baseline, no LOB, able to navigate obstacles, ambulated 150 feet      Balance Overall balance assessment: Needs assistance Sitting-balance support: Feet supported, No upper extremity supported Sitting balance-Leahy Scale: Good     Standing balance support: During functional activity, No upper extremity supported Standing balance-Leahy Scale: Good Standing balance comment: no LOB unsupported, static/dynamic balance                           ADL either performed or assessed with clinical judgement   ADL Overall ADL's : Modified independent;At baseline                                             Vision Baseline Vision/History: 1 Wears glasses Ability to See in Adequate Light: 0 Adequate Patient Visual Report: No change from baseline       Perception         Praxis         Pertinent Vitals/Pain Pain Assessment Pain Assessment: No/denies pain     Extremity/Trunk Assessment Upper Extremity Assessment Upper Extremity Assessment: RUE deficits/detail;LUE deficits/detail RUE Deficits / Details: some stiffness in shoulder, overall WFLs RUE Sensation: WNL RUE Coordination: WNL LUE Deficits / Details: history of RTC tear, decreased external rotation, unable to reach behind head, shoulder stiffness, 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 LUE Sensation: WNL LUE Coordination:  WNL   Lower Extremity Assessment Lower Extremity Assessment: Generalized weakness   Cervical / Trunk Assessment Cervical / Trunk Assessment: Kyphotic   Communication Communication Communication: No apparent difficulties   Cognition Arousal: Alert Behavior During Therapy: WFL for tasks assessed/performed Cognition: History of cognitive impairments             OT - Cognition Comments: mild memory impairments per family,                 Following commands: Intact       Cueing  General  Comments   Cueing Techniques: Verbal cues  VSS   Exercises     Shoulder Instructions      Home Living Family/patient expects to be discharged to:: Private residence Living Arrangements: Alone Available Help at Discharge: Family;Available PRN/intermittently Type of Home: House Home Access: Stairs to enter Entrance Stairs-Number of Steps: 1 Entrance Stairs-Rails: None Home Layout: One level     Bathroom Shower/Tub: Chief Strategy Officer: Standard     Home Equipment: Agricultural consultant (2 wheels);Shower seat;Grab bars - tub/shower   Additional Comments: Pt lives alone, has family who can assist occasionally as needed      Prior Functioning/Environment Prior Level of Function : Needs assist             Mobility Comments: pt indep without AD, but doesn't drive ADLs Comments: indep    OT Problem List: Decreased activity tolerance;Decreased strength   OT Treatment/Interventions:        OT Goals(Current goals can be found in the care plan section)   Acute Rehab OT Goals Patient Stated Goal: to return to PLOF OT Goal Formulation: With patient/family Time For Goal Achievement: 05/15/24 Potential to Achieve Goals: Good   OT Frequency:       Co-evaluation              AM-PAC OT 6 Clicks Daily Activity     Outcome Measure Help from another person eating meals?: None Help from another person taking care of personal grooming?: None Help from another person toileting, which includes using toliet, bedpan, or urinal?: None Help from another person bathing (including washing, rinsing, drying)?: None Help from another person to put on and taking off regular upper body clothing?: None Help from another person to put on and taking off regular lower body clothing?: None 6 Click Score: 24   End of Session Equipment Utilized During Treatment: Gait belt;Rolling walker (2 wheels) Nurse Communication: Mobility status  Activity Tolerance: Patient tolerated  treatment well Patient left: in chair;with call bell/phone within reach;with family/visitor present  OT Visit Diagnosis: Muscle weakness (generalized) (M62.81)                Time: 1202-1219 OT Time Calculation (min): 17 min Charges:  OT General Charges $OT Visit: 1 Visit OT Evaluation $OT Eval Low Complexity: 1 Low  [address] OTR/L   Scherry Curtis 05/01/2024, 12:25 PM

## 2024-05-01 NOTE — Transfer of Care (Signed)
 Immediate Anesthesia Transfer of Care Note  Patient: Amanda [name]  Procedure(s) Performed: EGD (ESOPHAGOGASTRODUODENOSCOPY)  Patient Location: PACU  Anesthesia Type:MAC  Level of Consciousness: drowsy  Airway & Oxygen Therapy: Patient Spontanous Breathing and Patient connected to nasal cannula oxygen  Post-op Assessment: Report given to RN and Post -op Vital signs reviewed and stable  Post vital signs: Reviewed and stable  Last Vitals:  Vitals Value Taken Time  BP 106/63 05/01/24 07:55  Temp    Pulse 63 05/01/24 07:57  Resp 20 05/01/24 07:57  SpO2 97 % 05/01/24 07:57  Vitals shown include unfiled device data.  Last Pain:  Vitals:   05/01/24 0702  TempSrc: Temporal  PainSc: 0-No pain      Patients Stated Pain Goal: 0 (05/01/24 1610)  Complications: No notable events documented.

## 2024-05-01 NOTE — Plan of Care (Signed)

## 2024-05-01 NOTE — Progress Notes (Signed)
 cbc

## 2024-05-01 NOTE — Discharge Summary (Addendum)
 Physician Discharge Summary   Patient: Amanda [name] MRN: [id number] DOB: 14-Apr-1933  Admit date:     04/26/2024  Discharge date: 05/01/24  Discharge Physician: Cherylle Corwin   PCP: Dorena Gander, MD   Recommendations at discharge:    Follow up with PCP in 1-2 weeks Follow up with Dr. Scherrie Curt as scheduled Recommend repeat CBC in 1-2 weeks  Discharge Diagnoses: Principal Problem:   Symptomatic anemia  Resolved Problems:   * No resolved hospital problems. *  Hospital Course: 89 y.o. 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  Found to be anemic with a hemoglobin of 5 with positive fecal occult.  GI consulted   Assessment and Plan: Principal problem Symptomatic anemia with concern for upper GI bleed, melena, gastric mass-CT scan October 24 showed thickened appearance of the distal stomach, this apparently was discussed as an outpatient and patient deferred an EGD.  GI consulted, eventually underwent an endoscopy on 6/9 which showed a large infiltrative circumferential mass with no bleeding, involving pylorus, pyloric channel and duodenal bulb.  Biopsies neg for high grade dysplasia and malignancy - Underwent a CT scan of the chest abdomen pelvis which showed a 2.0 x 2.3 cm left adrenal nodule, potentially representing metastasis.  No other metastatic disease was identified - Dr. Scherrie Curt following. Pt underwent repeat EGD 6/12, biopsies taken, to be f/u as outpatient - One unit of PRBC's were given this visit. Hgb at time of d/c of 9.3 -Pt to f/u with Hematology as outpatient   Active problems History of DVT/PE -hold anticoagulation given above.  Dr. Aldona Amel had a long discussion with the daughter at bedside. She is currently off blood  thinners  -Continue off anticoagulation per above   Underlying mild dementia-resume home medications    Essential hypertension-continue home metoprolol    PAD-noted, no current symptoms   Consultants: Oncology, GI Procedures performed: EGD with biopsy  Disposition: Home Diet recommendation:  Regular diet DISCHARGE MEDICATION: Allergies as of 05/01/2024       Reactions   Memantine  Other (See Comments)        Medication List     STOP taking these medications    aspirin  EC 81 MG tablet   rivaroxaban  20 MG Tabs tablet Commonly known as: XARELTO        TAKE these medications    acetaminophen  325 MG tablet Commonly known as: TYLENOL  Take 2 tablets (650 mg total) by mouth every 6 (six) hours as needed for mild pain, moderate pain, fever or headache (or Fever >/= 101).   albuterol  (2.5 MG/3ML) 0.083% nebulizer solution Commonly known as: PROVENTIL  Take 3 mLs (2.5 mg total) by nebulization every 6 (six) hours as needed for wheezing or shortness of breath.   arformoterol  15 MCG/2ML Nebu Commonly known as: BROVANA  Take 2 mLs (15 mcg total) by nebulization 2 (two) times daily.   CENTRUM SILVER PO Take 1 tablet by mouth every morning.   citalopram  20 MG tablet Commonly known as: CELEXA  Take 1 tablet (20 mg total) by mouth every morning.   cyanocobalamin  1000 MCG tablet Commonly known as: VITAMIN B12 Take 1,000 mcg by mouth every morning.   donepezil  10 MG tablet Commonly known as: ARICEPT  Take 10 mg by mouth at bedtime.   famotidine 20 MG tablet Commonly known as: PEPCID Take 20 mg by mouth daily.  ferrous sulfate  325 (65 FE) MG EC tablet Take 325 mg by mouth 2 (two) times a week.   latanoprost  0.005 % ophthalmic solution Commonly known as: XALATAN  Place 1 drop into both eyes every evening.   metoprolol  succinate 25 MG 24 hr tablet Commonly known as: TOPROL -XL Take 0.5 tablets (12.5 mg total) by mouth daily. Start taking on: May 02, 2024 What changed:  how much to take   pantoprazole  40 MG tablet Commonly known as: PROTONIX  Take 1 tablet (40 mg total) by mouth 2 (two) times daily.   revefenacin  175 MCG/3ML nebulizer solution Commonly known as: YUPELRI  Take 3 mLs (175 mcg total) by nebulization daily.        Follow-up Information     Dorena Gander, MD Follow up in 2 week(s).   Specialty: Family Medicine Why: Hospital follow up Contact information: [address] [place] [address] [phone]         Sumner Ends, MD Follow up.   Specialty: Oncology Why: Hospital follow up, as scheduled Contact information: [address] [place] [address] [phone]                Discharge Exam: Filed Weights   04/26/24 0102 05/01/24 7253  Weight: 48.1 kg 48.1 kg   General exam: Awake, laying in bed, in nad Respiratory system: Normal respiratory effort, no wheezing Cardiovascular system: regular rate, s1, s2 Gastrointestinal system: Soft, nondistended, positive BS Central nervous system: CN2-12 grossly intact, strength intact Extremities: Perfused, no clubbing Skin: Normal skin turgor, no notable skin lesions seen Psychiatry: Mood normal // no visual hallucinations   Condition at discharge: fair  The results of significant diagnostics from this hospitalization (including imaging, microbiology, ancillary and laboratory) are listed below for reference.   Imaging Studies: CT CHEST ABDOMEN PELVIS W CONTRAST Result Date: 04/28/2024 CLINICAL DATA:  Metastatic disease evaluation EXAM: CT CHEST, ABDOMEN, AND PELVIS WITH CONTRAST TECHNIQUE: Multidetector CT imaging of the chest, abdomen and pelvis was performed following the standard protocol during bolus administration of intravenous contrast. RADIATION DOSE REDUCTION: This exam was performed according to the departmental dose-optimization program which includes automated exposure control, adjustment of the mA and/or kV according to  patient size and/or use of iterative reconstruction technique. CONTRAST:  75mL OMNIPAQUE  IOHEXOL  350 MG/ML SOLN COMPARISON:  09/19/2023 FINDINGS: CT CHEST FINDINGS Cardiovascular: Cardiomegaly. No evidence of aortic aneurysm. Aortic atherosclerosis. Mediastinum/Nodes: No mediastinal, hilar, or axillary adenopathy. Trachea and esophagus are unremarkable. Thyroid  unremarkable. Lungs/Pleura: Mild centrilobular and paraseptal emphysema. Bibasilar dependent atelectasis. No effusions. Musculoskeletal: Chest wall soft tissues are unremarkable. Mild compression through the superior endplate of T8, likely mild chronic compression fracture. CT ABDOMEN PELVIS FINDINGS Hepatobiliary: No focal hepatic abnormality. Gallbladder unremarkable. Pancreas: No focal abnormality or ductal dilatation. Spleen: No focal abnormality.  Normal size. Adrenals/Urinary Tract: Left adrenal mass measures 2.3 x 2.0 cm. Right adrenal gland normal. No renal or ureteral stones. Urinary bladder unremarkable. Stomach/Bowel: Colonic diverticulosis. No active diverticulitis. Moderate stool burden throughout the colon. Stomach and small bowel decompressed. No bowel obstruction or inflammatory process. Vascular/Lymphatic: Diffuse aortoiliac atherosclerosis. No evidence of aneurysm or adenopathy. Reproductive: Prior hysterectomy.  No adnexal masses. Other: No free fluid or free air. Musculoskeletal: No acute bony abnormality. IMPRESSION: Cardiomegaly, aortic atherosclerosis. Bibasilar dependent opacities, likely atelectasis. Prominent central pulmonary arteries may reflect pulmonary arterial hypertension. 2.3 x 2.0 cm left adrenal nodule. Cannot exclude metastasis. This could be further evaluated with adrenal protocol MRI. Colonic diverticulosis.  Moderate stool burden throughout the  colon. Electronically Signed   By: Janeece Mechanic M.D.   On: 04/28/2024 20:21    Microbiology: Results for orders placed or performed during the hospital encounter of 12/29/22   SARS CORONAVIRUS 2 (TAT 6-24 HRS) Anterior Nasal Swab     Status: None   Collection Time: 12/29/22 11:38 AM   Specimen: Anterior Nasal Swab  Result Value Ref Range Status   SARS Coronavirus 2 NEGATIVE NEGATIVE Final    Comment: (NOTE) SARS-CoV-2 target nucleic acids are NOT DETECTED.  The SARS-CoV-2 RNA is generally detectable in upper and lower respiratory specimens during the acute phase of infection. Negative results do not preclude SARS-CoV-2 infection, do not rule out co-infections with other pathogens, and should not be used as the sole basis for treatment or other patient management decisions. Negative results must be combined with clinical observations, patient history, and epidemiological information. The expected result is Negative.  Fact Sheet for Patients: HairSlick.no  Fact Sheet for Healthcare Providers: quierodirigir.com  This test is not yet approved or cleared by the [place]  FDA and  has been authorized for detection and/or diagnosis of SARS-CoV-2 by FDA under an Emergency Use Authorization (EUA). This EUA will remain  in effect (meaning this test can be used) for the duration of the COVID-19 declaration under Se ction 564(b)(1) of the Act, 21 U.S.C. section 360bbb-3(b)(1), unless the authorization is terminated or revoked sooner.  Performed at [facility], [address], [place], [place] [address]     Labs: CBC: Recent Labs  Lab 04/26/24 0827 04/26/24 2137 04/27/24 0657 04/28/24 0437 04/29/24 0411 04/30/24 0427 05/01/24 0454  WBC 6.9   < > 9.2 8.8 8.2 6.5 8.3  NEUTROABS 5.1  --   --   --   --   --   --   HGB 5.1*   < > 8.3* 7.9* 7.2* 7.1* 9.3*  HCT 17.4*   < > 25.9* 25.1* 23.3* 23.3* 28.8*  MCV 93.0   < > 89.9 90.6 90.7 91.7 91.1  PLT 379   < > 291 334 329 347 362   < > = values in this interval not displayed.   Basic Metabolic Panel: Recent Labs  Lab 04/26/24 0827  04/27/24 0657 04/28/24 0437 04/29/24 0411 05/01/24 0454  NA 138 137 137 135 134*  K 3.5 3.4* 3.7 3.6 3.9  CL 105 103 104 102 101  CO2 26 27 28 27 26   GLUCOSE 103* 93 100* 98 92  BUN 15 10 11 10 10   CREATININE 0.78 0.63 0.77 0.76 0.70  CALCIUM 8.7* 8.1* 8.4* 8.0* 8.4*  MG  --   --  1.9 1.9  --    Liver Function Tests: Recent Labs  Lab 04/27/24 0657 05/01/24 0454  AST 17 20  ALT 11 14  ALKPHOS 24* 38  BILITOT 0.6 0.8  PROT 5.1* 5.3*  ALBUMIN 2.6* 2.7*   CBG: No results for input(s): GLUCAP in the last 168 hours.  Discharge time spent: less than 30 minutes.  Signed: Cherylle Corwin, MD [facility] 05/01/2024

## 2024-05-01 NOTE — Interval H&P Note (Signed)
 History and Physical Interval Note: 89/female for EGD with biopsies for known gastric/duodenal mass.  05/01/2024 7:26 AM  [name]  has presented today for EGD with propofol with the diagnosis of duodenal mass, repeat EGD to get further biopsies to rule out underlying cancer.  The various methods of treatment have been discussed with the patient and family. After consideration of risks, benefits and other options for treatment, the patient has consented to  Procedure(s): EGD (ESOPHAGOGASTRODUODENOSCOPY) (N/A) as a surgical intervention.  The patient's history has been reviewed, patient examined, no change in status, stable for surgery.  I have reviewed the patient's chart and labs.  Questions were answered to the patient's satisfaction.     Amanda [name]

## 2024-05-01 NOTE — Anesthesia Postprocedure Evaluation (Signed)
 Anesthesia Post Note  Patient: Amanda [name]  Procedure(s) Performed: EGD (ESOPHAGOGASTRODUODENOSCOPY)     Patient location during evaluation: PACU Anesthesia Type: MAC Level of consciousness: awake and alert Pain management: pain level controlled Vital Signs Assessment: post-procedure vital signs reviewed and stable Respiratory status: spontaneous breathing, nonlabored ventilation, respiratory function stable and patient connected to nasal cannula oxygen Cardiovascular status: stable and blood pressure returned to baseline Postop Assessment: no apparent nausea or vomiting Anesthetic complications: no   No notable events documented.  Last Vitals:  Vitals:   05/01/24 0815 05/01/24 1155  BP: (!) 162/89 118/60  Pulse: (!) 54 66  Resp: (!) 22 17  Temp:  36.8 C  SpO2: 95% 96%    Last Pain:  Vitals:   05/01/24 0800  TempSrc:   PainSc: Asleep                 Theotis Flake P Zell Doucette

## 2024-05-01 NOTE — Progress Notes (Signed)
 Reviewed AVS, patient expressed understanding of medications, MD follow up reviewed.   Removed IV, Site clean, dry and intact.  Patients dtr. Was informed and expressed understanding to pick up discharge medications from [facility]. Dtr. Asked multiple questions about patients medications list; all questions were answered. Patients Dtr. Then contacted the patients mail order pharmacy to go over medications. After that call ended the patients Dtr. Then contacted a family member and proceeded to review all medications again.  Asked the patients Dtr if they were ready to go and she responded no when they are ready she will call us . I asked if she needed anything else and she stated no; I exited the room.

## 2024-05-01 NOTE — Evaluation (Signed)
 Physical Therapy Evaluation Patient Details Name: Melita Villalona MRN: [id number] DOB: [birth year] Today's Date: 05/01/2024  History of Present Illness  Pt is an 89yo female admitted for weakness, increased confusion and dark stools. 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 PMH: prior DVT/PE on chronic anticoagulation with Xarelto , hypertension, hyperlipidemia, peripheral arterial disease, mild Alzheimers   Clinical Impression  Pt admitted with above. PTA pt living alone with 2 granddaughters that live close by that look after her. Pt does not drive but was amb without AD and indep for ADLs. Pt with generalized weakness, impaired balance, and now benefits from RW for safe ambulation at this time. Pt with mild confusion. Dtr reports she has taken FMLA for the next week. Recommend HHPT to address above deficts and progress back to safe indep function. Acute PT to cont to follow.       If plan is discharge home, recommend the following: A little help with walking and/or transfers;A little help with bathing/dressing/bathroom;Help with stairs or ramp for entrance;Supervision due to cognitive status   Can travel by private vehicle        Equipment Recommendations None recommended by PT  Recommendations for Other Services       Functional Status Assessment Patient has had a recent decline in their functional status and demonstrates the ability to make significant improvements in function in a reasonable and predictable amount of time.     Precautions / Restrictions Precautions Precautions: Fall Restrictions Weight Bearing Restrictions Per Provider Order: No      Mobility  Bed Mobility               General bed mobility comments: pt received sitting up in chair    Transfers Overall transfer level: Needs assistance Equipment used: Rolling walker (2 wheels) Transfers: Sit to/from Stand Sit to Stand: Supervision           General transfer comment: verbal cues for  safe hand placement    Ambulation/Gait Ambulation/Gait assistance: Contact guard assist Gait Distance (Feet): 200 Feet Assistive device: Rolling walker (2 wheels) Gait Pattern/deviations: Step-through pattern, Decreased stride length, Trunk flexed Gait velocity: wfl Gait velocity interpretation: 1.31 - 2.62 ft/sec, indicative of limited community ambulator   General Gait Details: pt with vearing to the R but declines assist stating you dont need to hold me. pt with one episode of instability with report i'm fine.  Stairs            Wheelchair Mobility     Tilt Bed    Modified Rankin (Stroke Patients Only)       Balance Overall balance assessment: Needs assistance Sitting-balance support: Feet supported, No upper extremity supported Sitting balance-Leahy Scale: Good     Standing balance support: Bilateral upper extremity supported, During functional activity, Reliant on assistive device for balance Standing balance-Leahy Scale: Fair Standing balance comment: pt with fair static standing balance but benefits from RW for safe ambulation                             Pertinent Vitals/Pain Pain Assessment Pain Assessment: No/denies pain    Home Living Family/patient expects to be discharged to:: Private residence Living Arrangements: Alone Available Help at Discharge: Family;Available PRN/intermittently Type of Home: House Home Access: Stairs to enter Entrance Stairs-Rails: None Entrance Stairs-Number of Steps: 1   Home Layout: One level Home Equipment: Agricultural consultant (2 wheels);Shower seat;Grab bars - toilet  Prior Function Prior Level of Function : Needs assist             Mobility Comments: pt indep without AD, but doesn't drive ADLs Comments: indep     Extremity/Trunk Assessment   Upper Extremity Assessment Upper Extremity Assessment: Generalized weakness    Lower Extremity Assessment Lower Extremity Assessment: Generalized  weakness    Cervical / Trunk Assessment Cervical / Trunk Assessment: Kyphotic  Communication   Communication Communication: No apparent difficulties    Cognition Arousal: Alert Behavior During Therapy: WFL for tasks assessed/performed   PT - Cognitive impairments: History of cognitive impairments                       PT - Cognition Comments: pt with known memory deficits, mild confusion but oriented x4 Following commands: Intact       Cueing Cueing Techniques: Verbal cues     General Comments General comments (skin integrity, edema, etc.): VSS    Exercises     Assessment/Plan    PT Assessment Patient needs continued PT services  PT Problem List Decreased strength;Decreased range of motion;Decreased activity tolerance;Decreased balance;Decreased mobility       PT Treatment Interventions DME instruction;Gait training;Stair training;Functional mobility training;Therapeutic activities;Therapeutic exercise    PT Goals (Current goals can be found in the Care Plan section)  Acute Rehab PT Goals Patient Stated Goal: home today PT Goal Formulation: With patient/family Time For Goal Achievement: 05/15/24 Potential to Achieve Goals: Good Additional Goals Additional Goal #1: Pt to score > 19 on DGI to indicate minimal falls risk.    Frequency Min 2X/week     Co-evaluation               AM-PAC PT 6 Clicks Mobility  Outcome Measure Help needed turning from your back to your side while in a flat bed without using bedrails?: A Little Help needed moving from lying on your back to sitting on the side of a flat bed without using bedrails?: A Little Help needed moving to and from a bed to a chair (including a wheelchair)?: A Little Help needed standing up from a chair using your arms (e.g., wheelchair or bedside chair)?: A Little Help needed to walk in hospital room?: A Little Help needed climbing 3-5 steps with a railing? : A Little 6 Click Score: 18     End of Session   Activity Tolerance: Patient tolerated treatment well Patient left: in chair;with call bell/phone within reach;with family/visitor present Nurse Communication: Mobility status PT Visit Diagnosis: Unsteadiness on feet (R26.81);Other abnormalities of gait and mobility (R26.89);Muscle weakness (generalized) (M62.81)    Time: 9147-8295 PT Time Calculation (min) (ACUTE ONLY): 16 min   Charges:   PT Evaluation $PT Eval Low Complexity: 1 Low   PT General Charges $$ ACUTE PT VISIT: 1 Visit         Renaee Caro, PT, DPT Acute Rehabilitation Services Secure chat preferred Office #: [phone]   Jenna Moan 05/01/2024, 11:53 AM

## 2024-05-01 NOTE — Op Note (Signed)
 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 Estimated Blood Loss:     Estimated blood loss was minimal. Procedure:                Pre-Anesthesia Assessment:                           - Prior to the procedure, a History and Physical                            was performed, and patient medications and                            allergies were reviewed. The patient's tolerance of                            previous anesthesia was also reviewed. The risks                            and benefits of the procedure and the sedation                            options and risks were discussed with the patient.                            All questions were answered, and informed consent                            was obtained. Prior Anticoagulants: The patient has                            taken Xarelto  (  rivaroxaban ), last dose was 7 days                            prior to procedure. ASA Grade Assessment: III - A                            patient with severe systemic disease. After                            reviewing the risks and benefits, the patient was                            deemed in satisfactory condition to undergo the                             procedure.                           After obtaining informed consent, the endoscope was                            passed under direct vision. Throughout the                            procedure, the patient's blood pressure, pulse, and                            oxygen saturations were monitored continuously. The                            [id number]-H190 ([id number]) Olympus endoscope was introduced                            through the mouth, and advanced to the second part                            of duodenum. The upper GI endoscopy was                            accomplished without difficulty. The patient                            tolerated the procedure well. Scope In: Scope Out: Findings:      The examined esophagus was normal.      A large, frond-like/villous, fungating and sessile mass, at least 5 cm       in length, broad based, with no bleeding and no stigmata of recent       bleeding was found at the pylorus.      The mass was noted more on the gastric side/pyloric channel and did not       seem to duodenal.      Multiple biopsies were taken with a cold forceps for histology from       different areas of the mass.      The cardia and gastric fundus were normal on  retroflexion.      The examined duodenum was normal. Impression:               - Normal esophagus.                           - Rule out malignancy, gastric tumor at the                            pylorus. Biopsied.                           - Normal examined duodenum. Moderate Sedation:      Patient did not receive moderate sedation for this procedure, but       instead received monitored anesthesia care. Recommendation:           - Await pathology results.                           - Resume regular diet.                           - PPI once a day indefinitely. Procedure Code(s):        --- Professional ---                           [phone], Esophagogastroduodenoscopy, flexible,                            transoral;  with biopsy, single or multiple Diagnosis Code(s):        --- Professional ---                           D49.0, Neoplasm of unspecified behavior of                            digestive system CPT copyright 2022 American Medical Association. All rights reserved. The codes documented in this report are preliminary and upon coder review may  be revised to meet current compliance requirements. Genell Ken, MD 05/01/2024 7:59:41 AM This report has been signed electronically. Number of Addenda: 0

## 2024-05-01 NOTE — Progress Notes (Signed)
 Oncology Discharge Planning Note  [facility] Address: [address], [place], [place] [address] Hours of Operation:  [place] [place], Monday - Friday  Clinic Contact Information:  [phone]) [phone]  Oncology Care Team: Medical Oncologist:  Scherrie Curt  Patient Details: Name:  Amanda [name], Amanda [name] MRN:   [id number] DOB:   Aug 28, 1933 Reason for Current Admission: @PPROB @  Discharge Planning Narrative: Notification of admission received by Inpatient team for [facility]  Discharge follow-up appointments for oncology are current and available on the AVS and MyChart.   Upon discharge from the hospital, hematology/oncology's post discharge plan of care for the outpatient setting is:   June16, 2025 at 3:15 for labs  June 24 for labs and Dr Scherrie Curt visit  [facility] [address] [place], [place] [address]  [phone]   Mishika Flippen will be called within two business days after discharge to review hematology/oncology's plan of care for full understanding.    Outpatient Oncology Specific Care Only: Oncology appointment transportation needs addressed?:  no Oncology medication management for symptom management addressed?:  not applicable Chemo Alert Card reviewed?:  not applicable Immunotherapy Alert Card reviewed?:  not applicable

## 2024-05-04 ENCOUNTER — Encounter (HOSPITAL_COMMUNITY): Payer: Self-pay | Admitting: Gastroenterology

## 2024-05-05 ENCOUNTER — Encounter: Payer: Self-pay | Admitting: *Deleted

## 2024-05-05 ENCOUNTER — Telehealth: Payer: Self-pay | Admitting: *Deleted

## 2024-05-05 ENCOUNTER — Ambulatory Visit: Payer: Self-pay | Admitting: Oncology

## 2024-05-05 ENCOUNTER — Inpatient Hospital Stay: Attending: Oncology

## 2024-05-05 DIAGNOSIS — Z86711 Personal history of pulmonary embolism: Secondary | ICD-10-CM | POA: Insufficient documentation

## 2024-05-05 DIAGNOSIS — D509 Iron deficiency anemia, unspecified: Secondary | ICD-10-CM | POA: Diagnosis not present

## 2024-05-05 DIAGNOSIS — D649 Anemia, unspecified: Secondary | ICD-10-CM

## 2024-05-05 DIAGNOSIS — D132 Benign neoplasm of duodenum: Secondary | ICD-10-CM | POA: Diagnosis not present

## 2024-05-05 LAB — CMP (CANCER CENTER ONLY)
ALT: 16 U/L (ref 0–44)
AST: 21 U/L (ref 15–41)
Albumin: 3.8 g/dL (ref 3.5–5.0)
Alkaline Phosphatase: 54 U/L (ref 38–126)
Anion gap: 10 (ref 5–15)
BUN: 14 mg/dL (ref 8–23)
CO2: 27 mmol/L (ref 22–32)
Calcium: 9.4 mg/dL (ref 8.9–10.3)
Chloride: 104 mmol/L (ref 98–111)
Creatinine: 0.84 mg/dL (ref 0.44–1.00)
GFR, Estimated: >60 mL/min (ref >60)
Glucose, Bld: 80 mg/dL (ref 70–99)
Potassium: 4 mmol/L (ref 3.5–5.1)
Sodium: 141 mmol/L (ref 135–145)
Total Bilirubin: <0.2 mg/dL (ref 0.0–1.2)
Total Protein: 6.5 g/dL (ref 6.5–8.1)

## 2024-05-05 LAB — SAMPLE TO BLOOD BANK

## 2024-05-05 LAB — CBC WITH DIFFERENTIAL (CANCER CENTER ONLY)
Abs Immature Granulocytes: 0.02 10*3/uL (ref 0.00–0.07)
Basophils Absolute: 0 10*3/uL (ref 0.0–0.1)
Basophils Relative: 1 %
Eosinophils Absolute: 0.2 10*3/uL (ref 0.0–0.5)
Eosinophils Relative: 3 %
HCT: 32.9 % — ABNORMAL LOW (ref 36.0–46.0)
Hemoglobin: 10.1 g/dL — ABNORMAL LOW (ref 12.0–15.0)
Immature Granulocytes: 0 %
Lymphocytes Relative: 16 %
Lymphs Abs: 1 10*3/uL (ref 0.7–4.0)
MCH: 28.8 pg (ref 26.0–34.0)
MCHC: 30.7 g/dL (ref 30.0–36.0)
MCV: 93.7 fL (ref 80.0–100.0)
Monocytes Absolute: 0.7 10*3/uL (ref 0.1–1.0)
Monocytes Relative: 10 %
Neutro Abs: 4.4 10*3/uL (ref 1.7–7.7)
Neutrophils Relative %: 70 %
Platelet Count: 368 10*3/uL (ref 150–400)
RBC: 3.51 MIL/uL — ABNORMAL LOW (ref 3.87–5.11)
RDW: 16.2 % — ABNORMAL HIGH (ref 11.5–15.5)
WBC Count: 6.3 10*3/uL (ref 4.0–10.5)
nRBC: 0 % (ref 0.0–0.2)

## 2024-05-05 NOTE — Telephone Encounter (Signed)
 Called granddaughter, Amanda [name] w/CBC and CMP results. No transfusion needed. F/U for new patient appointment on 6/24 as scheduled. She may need to reschedule due to her 88 yo son will be with her since he is out of school. Informed her he could sit in our waiting area inside door and watch TV. We can set him up with some snacks as well. She will consider and call if she needs to reschedule.

## 2024-05-05 NOTE — Progress Notes (Signed)
 Per Dr Scherrie Curt called pt's granddaughter and daughter to discuss pathology report from 6/12 and findings were negative for malignancy  Informed family that Ms Amanda [name] will be presented this week at GI conference but that Dr Scherrie Curt recommendation at this time is observation  Verbalized understanding and reviewed upcoming appts

## 2024-05-06 ENCOUNTER — Other Ambulatory Visit

## 2024-05-06 NOTE — Telephone Encounter (Signed)
 Notified granddaughter that repeat biopsies of the duodenal mass are benign, the hemoglobin is higher, follow-up as scheduled

## 2024-05-07 ENCOUNTER — Telehealth: Payer: Self-pay | Admitting: Family Medicine

## 2024-05-07 ENCOUNTER — Other Ambulatory Visit: Payer: Self-pay

## 2024-05-07 ENCOUNTER — Other Ambulatory Visit

## 2024-05-07 DIAGNOSIS — Z9181 History of falling: Secondary | ICD-10-CM | POA: Diagnosis not present

## 2024-05-07 DIAGNOSIS — I493 Ventricular premature depolarization: Secondary | ICD-10-CM | POA: Diagnosis not present

## 2024-05-07 DIAGNOSIS — I10 Essential (primary) hypertension: Secondary | ICD-10-CM | POA: Diagnosis not present

## 2024-05-07 DIAGNOSIS — G309 Alzheimer's disease, unspecified: Secondary | ICD-10-CM | POA: Diagnosis not present

## 2024-05-07 DIAGNOSIS — Z87891 Personal history of nicotine dependence: Secondary | ICD-10-CM | POA: Diagnosis not present

## 2024-05-07 DIAGNOSIS — I739 Peripheral vascular disease, unspecified: Secondary | ICD-10-CM | POA: Diagnosis not present

## 2024-05-07 DIAGNOSIS — D649 Anemia, unspecified: Secondary | ICD-10-CM | POA: Diagnosis not present

## 2024-05-07 DIAGNOSIS — M810 Age-related osteoporosis without current pathological fracture: Secondary | ICD-10-CM | POA: Diagnosis not present

## 2024-05-07 DIAGNOSIS — Z86711 Personal history of pulmonary embolism: Secondary | ICD-10-CM | POA: Diagnosis not present

## 2024-05-07 DIAGNOSIS — E785 Hyperlipidemia, unspecified: Secondary | ICD-10-CM | POA: Diagnosis not present

## 2024-05-07 DIAGNOSIS — Z86718 Personal history of other venous thrombosis and embolism: Secondary | ICD-10-CM | POA: Diagnosis not present

## 2024-05-07 NOTE — Telephone Encounter (Signed)
 Copied from CRM [id number]. Topic: Clinical - Medication Question >> May 07, 2024  1:45 PM [name] wrote: Reason for CRM: Amanda [name] w/ [facility] home Care states he saw the pt today, and some Pulmonary meds were missing from the home.  Please call, as he would like to go over w/ a nurse which ones she should be taking The 2 meds that were missing 1. albuterol  (PROVENTIL ) (2.5 MG/3ML) 0.083% nebulizer solution 2. revefenacin  (YUPELRI ) 175 MCG/3ML nebulizer solution   Callback [phone] If no answer, please leave a detailed message, as this VM is confidential.

## 2024-05-08 ENCOUNTER — Other Ambulatory Visit

## 2024-05-08 LAB — SURGICAL PATHOLOGY

## 2024-05-08 NOTE — Telephone Encounter (Signed)
 Pt is granddaughter Control and instrumentation engineer in [place]) states that she had an oversupply of medication at one point that is why she has not been seen. Sh will go look at the supply and see which one is needed and get back with me. I told her she will need an appt for further refills after the courtesy refill if needed.

## 2024-05-09 ENCOUNTER — Other Ambulatory Visit

## 2024-05-09 NOTE — Progress Notes (Signed)
 The proposed treatment discussed in conference is for discussion purpose only and is not a binding recommendation.  The patients have not been physically examined, or presented with their treatment options.  Therefore, final treatment plans cannot be decided.

## 2024-05-13 ENCOUNTER — Inpatient Hospital Stay

## 2024-05-13 ENCOUNTER — Inpatient Hospital Stay: Admitting: Oncology

## 2024-05-13 VITALS — BP 95/52 | HR 74 | Temp 97.7°F | Resp 18 | Ht 60.0 in | Wt 106.7 lb

## 2024-05-13 DIAGNOSIS — D649 Anemia, unspecified: Secondary | ICD-10-CM | POA: Diagnosis not present

## 2024-05-13 DIAGNOSIS — D132 Benign neoplasm of duodenum: Secondary | ICD-10-CM | POA: Diagnosis not present

## 2024-05-13 DIAGNOSIS — Z86711 Personal history of pulmonary embolism: Secondary | ICD-10-CM | POA: Diagnosis not present

## 2024-05-13 DIAGNOSIS — D509 Iron deficiency anemia, unspecified: Secondary | ICD-10-CM | POA: Diagnosis not present

## 2024-05-13 LAB — CMP (CANCER CENTER ONLY)
ALT: 12 U/L (ref 0–44)
AST: 25 U/L (ref 15–41)
Albumin: 3.8 g/dL (ref 3.5–5.0)
Alkaline Phosphatase: 53 U/L (ref 38–126)
Anion gap: 11 (ref 5–15)
BUN: 15 mg/dL (ref 8–23)
CO2: 26 mmol/L (ref 22–32)
Calcium: 9.3 mg/dL (ref 8.9–10.3)
Chloride: 104 mmol/L (ref 98–111)
Creatinine: 0.77 mg/dL (ref 0.44–1.00)
GFR, Estimated: >60 mL/min (ref >60)
Glucose, Bld: 90 mg/dL (ref 70–99)
Potassium: 4 mmol/L (ref 3.5–5.1)
Sodium: 141 mmol/L (ref 135–145)
Total Bilirubin: 0.3 mg/dL (ref 0.0–1.2)
Total Protein: 6.7 g/dL (ref 6.5–8.1)

## 2024-05-13 LAB — CBC WITH DIFFERENTIAL (CANCER CENTER ONLY)
Abs Immature Granulocytes: 0.01 10*3/uL (ref 0.00–0.07)
Basophils Absolute: 0.1 10*3/uL (ref 0.0–0.1)
Basophils Relative: 1 %
Eosinophils Absolute: 0.1 10*3/uL (ref 0.0–0.5)
Eosinophils Relative: 2 %
HCT: 35.7 % — ABNORMAL LOW (ref 36.0–46.0)
Hemoglobin: 10.9 g/dL — ABNORMAL LOW (ref 12.0–15.0)
Immature Granulocytes: 0 %
Lymphocytes Relative: 14 %
Lymphs Abs: 1 10*3/uL (ref 0.7–4.0)
MCH: 28 pg (ref 26.0–34.0)
MCHC: 30.5 g/dL (ref 30.0–36.0)
MCV: 91.8 fL (ref 80.0–100.0)
Monocytes Absolute: 0.7 10*3/uL (ref 0.1–1.0)
Monocytes Relative: 10 %
Neutro Abs: 5 10*3/uL (ref 1.7–7.7)
Neutrophils Relative %: 73 %
Platelet Count: 276 10*3/uL (ref 150–400)
RBC: 3.89 MIL/uL (ref 3.87–5.11)
RDW: 15.8 % — ABNORMAL HIGH (ref 11.5–15.5)
WBC Count: 6.9 10*3/uL (ref 4.0–10.5)
nRBC: 0 % (ref 0.0–0.2)

## 2024-05-13 LAB — SAMPLE TO BLOOD BANK

## 2024-05-13 NOTE — Progress Notes (Signed)
 Amanda [name]   Diagnosis: Gastric/duodenal adenoma, anemia  INTERVAL HISTORY:   Amanda [name] was discharged from the hospital on 05/01/2024.  She is here today with her granddaughter.  She is taking iron twice per week.  She is tolerating a diet.  No bleeding.  No nausea or vomiting.  No dyspnea.  No complaint.  Objective:  Vital signs in last 24 hours:  Blood pressure (!) 95/52, pulse 74, temperature 97.7 F (36.5 C), temperature source Temporal, resp. rate 18, height 5' (1.524 m), weight 106 lb 11.2 oz (48.4 kg), SpO2 94%.     Resp: Lungs clear bilaterally, no respiratory distress Cardio: Regular rate and rhythm GI: No mass, no hepatosplenomegaly, nontender Vascular: No leg edema  Lab Results:  Lab Results  Component Value Date   WBC 6.9 05/13/2024   HGB 10.9 (L) 05/13/2024   HCT 35.7 (L) 05/13/2024   MCV 91.8 05/13/2024   PLT 276 05/13/2024   NEUTROABS 5.0 05/13/2024    CMP  Lab Results  Component Value Date   NA 141 05/05/2024   K 4.0 05/05/2024   CL 104 05/05/2024   CO2 27 05/05/2024   GLUCOSE 80 05/05/2024   BUN 14 05/05/2024   CREATININE 0.84 05/05/2024   CALCIUM 9.4 05/05/2024   PROT 6.5 05/05/2024   ALBUMIN 3.8 05/05/2024   AST 21 05/05/2024   ALT 16 05/05/2024   ALKPHOS 54 05/05/2024   BILITOT <0.2 05/05/2024   GFRNONAA >60 05/05/2024   GFRAA >60 05/04/2020    Lab Results  Component Value Date   CEA1 1.1 04/28/2024   RJW800 9 04/28/2024    No results found for: INR, LABPROT  Imaging:  No results found.  Medications: I have reviewed the patient's current medications.   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   Iron deficiency anemia secondary to #1, status post 2 units packed red blood cells 04/26/2024, 1 unit packed red cells 04/30/2024 3.   Mild dementia 4.   Hypertension 5.   History of DVT/pulmonary embolism, maintained on rivaroxaban  prior to hospital admission Pulmonary embolism 12/02/2022, indefinite anticoagulation recommended Left leg DVT November 2021, Doppler 11/24/2019-[facility], thrombus in a calf vein tributary of the popliteal, deep venous system patent, Xarelto  (family reports approximately 6 weeks of anticoagulation therapy) 6.   History of depression 7.   COPD 8.   Hyperlipidemia 9.   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  Repeat biopsies are negative for invasive carcinoma.  She presented with symptomatic iron deficiency anemia.  Anticoagulation therapy was discontinued and she is taking iron.  The hemoglobin is improved today.  I recommend she take iron daily.  She will remain off of anticoagulation.  Her family will contact us  if she develops symptoms of recurrent venous thrombosis.  Her case was presented at the GI tumor conference on 05/07/2024.  The gastroenterologist present feels tumor could potentially be resected by an endoscopic technique.  She would need to undergo a staging EUS to assess for depth of invasion.  She can be referred to one of the local  academic medical centers to consider this procedure.  I discussed resection with her granddaughter.  She will discuss this with family members and let us  know if they would like to have Amanda [name] evaluated for resection of the mass.  She will return for an office visit and CBC in 1 month.    Arley Hof, MD  05/13/2024  1:58 PM

## 2024-05-14 DIAGNOSIS — Z9181 History of falling: Secondary | ICD-10-CM | POA: Diagnosis not present

## 2024-05-14 DIAGNOSIS — E785 Hyperlipidemia, unspecified: Secondary | ICD-10-CM | POA: Diagnosis not present

## 2024-05-14 DIAGNOSIS — D649 Anemia, unspecified: Secondary | ICD-10-CM | POA: Diagnosis not present

## 2024-05-14 DIAGNOSIS — M810 Age-related osteoporosis without current pathological fracture: Secondary | ICD-10-CM | POA: Diagnosis not present

## 2024-05-14 DIAGNOSIS — G309 Alzheimer's disease, unspecified: Secondary | ICD-10-CM | POA: Diagnosis not present

## 2024-05-14 DIAGNOSIS — I493 Ventricular premature depolarization: Secondary | ICD-10-CM | POA: Diagnosis not present

## 2024-05-14 DIAGNOSIS — I739 Peripheral vascular disease, unspecified: Secondary | ICD-10-CM | POA: Diagnosis not present

## 2024-05-14 DIAGNOSIS — I10 Essential (primary) hypertension: Secondary | ICD-10-CM | POA: Diagnosis not present

## 2024-05-14 DIAGNOSIS — Z87891 Personal history of nicotine dependence: Secondary | ICD-10-CM | POA: Diagnosis not present

## 2024-05-14 DIAGNOSIS — Z86711 Personal history of pulmonary embolism: Secondary | ICD-10-CM | POA: Diagnosis not present

## 2024-05-14 DIAGNOSIS — Z86718 Personal history of other venous thrombosis and embolism: Secondary | ICD-10-CM | POA: Diagnosis not present

## 2024-05-15 DIAGNOSIS — Z86711 Personal history of pulmonary embolism: Secondary | ICD-10-CM | POA: Diagnosis not present

## 2024-05-15 DIAGNOSIS — R531 Weakness: Secondary | ICD-10-CM | POA: Diagnosis not present

## 2024-05-15 DIAGNOSIS — I10 Essential (primary) hypertension: Secondary | ICD-10-CM | POA: Diagnosis not present

## 2024-05-15 DIAGNOSIS — D131 Benign neoplasm of stomach: Secondary | ICD-10-CM | POA: Diagnosis not present

## 2024-05-15 DIAGNOSIS — D509 Iron deficiency anemia, unspecified: Secondary | ICD-10-CM | POA: Diagnosis not present

## 2024-05-15 DIAGNOSIS — Z86718 Personal history of other venous thrombosis and embolism: Secondary | ICD-10-CM | POA: Diagnosis not present

## 2024-05-15 DIAGNOSIS — K219 Gastro-esophageal reflux disease without esophagitis: Secondary | ICD-10-CM | POA: Diagnosis not present

## 2024-05-16 ENCOUNTER — Other Ambulatory Visit (HOSPITAL_BASED_OUTPATIENT_CLINIC_OR_DEPARTMENT_OTHER): Payer: Self-pay | Admitting: Family Medicine

## 2024-05-16 DIAGNOSIS — M810 Age-related osteoporosis without current pathological fracture: Secondary | ICD-10-CM

## 2024-05-16 DIAGNOSIS — Z9181 History of falling: Secondary | ICD-10-CM | POA: Diagnosis not present

## 2024-05-16 DIAGNOSIS — D509 Iron deficiency anemia, unspecified: Secondary | ICD-10-CM | POA: Diagnosis not present

## 2024-05-16 DIAGNOSIS — Z86711 Personal history of pulmonary embolism: Secondary | ICD-10-CM | POA: Diagnosis not present

## 2024-05-16 DIAGNOSIS — I10 Essential (primary) hypertension: Secondary | ICD-10-CM | POA: Diagnosis not present

## 2024-05-16 DIAGNOSIS — E785 Hyperlipidemia, unspecified: Secondary | ICD-10-CM | POA: Diagnosis not present

## 2024-05-16 DIAGNOSIS — Z87891 Personal history of nicotine dependence: Secondary | ICD-10-CM | POA: Diagnosis not present

## 2024-05-16 DIAGNOSIS — Z86718 Personal history of other venous thrombosis and embolism: Secondary | ICD-10-CM | POA: Diagnosis not present

## 2024-05-16 DIAGNOSIS — R531 Weakness: Secondary | ICD-10-CM | POA: Diagnosis not present

## 2024-05-16 DIAGNOSIS — G309 Alzheimer's disease, unspecified: Secondary | ICD-10-CM | POA: Diagnosis not present

## 2024-05-16 DIAGNOSIS — D649 Anemia, unspecified: Secondary | ICD-10-CM | POA: Diagnosis not present

## 2024-05-16 DIAGNOSIS — I493 Ventricular premature depolarization: Secondary | ICD-10-CM | POA: Diagnosis not present

## 2024-05-16 DIAGNOSIS — I739 Peripheral vascular disease, unspecified: Secondary | ICD-10-CM | POA: Diagnosis not present

## 2024-05-20 DIAGNOSIS — E785 Hyperlipidemia, unspecified: Secondary | ICD-10-CM | POA: Diagnosis not present

## 2024-05-20 DIAGNOSIS — D649 Anemia, unspecified: Secondary | ICD-10-CM | POA: Diagnosis not present

## 2024-05-20 DIAGNOSIS — Z87891 Personal history of nicotine dependence: Secondary | ICD-10-CM | POA: Diagnosis not present

## 2024-05-20 DIAGNOSIS — M810 Age-related osteoporosis without current pathological fracture: Secondary | ICD-10-CM | POA: Diagnosis not present

## 2024-05-20 DIAGNOSIS — I10 Essential (primary) hypertension: Secondary | ICD-10-CM | POA: Diagnosis not present

## 2024-05-20 DIAGNOSIS — G309 Alzheimer's disease, unspecified: Secondary | ICD-10-CM | POA: Diagnosis not present

## 2024-05-20 DIAGNOSIS — Z86718 Personal history of other venous thrombosis and embolism: Secondary | ICD-10-CM | POA: Diagnosis not present

## 2024-05-20 DIAGNOSIS — Z9181 History of falling: Secondary | ICD-10-CM | POA: Diagnosis not present

## 2024-05-20 DIAGNOSIS — I739 Peripheral vascular disease, unspecified: Secondary | ICD-10-CM | POA: Diagnosis not present

## 2024-05-20 DIAGNOSIS — Z86711 Personal history of pulmonary embolism: Secondary | ICD-10-CM | POA: Diagnosis not present

## 2024-05-20 DIAGNOSIS — I493 Ventricular premature depolarization: Secondary | ICD-10-CM | POA: Diagnosis not present

## 2024-05-21 ENCOUNTER — Telehealth: Payer: Self-pay

## 2024-05-21 DIAGNOSIS — K921 Melena: Secondary | ICD-10-CM | POA: Diagnosis not present

## 2024-05-21 DIAGNOSIS — D131 Benign neoplasm of stomach: Secondary | ICD-10-CM | POA: Diagnosis not present

## 2024-05-21 DIAGNOSIS — I10 Essential (primary) hypertension: Secondary | ICD-10-CM | POA: Diagnosis not present

## 2024-05-21 NOTE — Telephone Encounter (Signed)
 Copied from CRM [id number]. Topic: Clinical - Medication Question >> May 21, 2024  2:16 PM Devaughn RAMAN wrote: Reason for CRM: Patient granddaughter Maryjo called regarding a subsitiute for medication revefenacin  (YUPELRI ) 175 MCG/3ML nebulizer solution for the patient. Marquita stated patient no longer takes the medication due to it gibing her a cough and her insurance company stated there is a substitute for a cheaper cost for the patient and they would like to know if she can change the medication to Spiriva.  Please advise.

## 2024-05-21 NOTE — Telephone Encounter (Signed)
 Spiriva would be ok - we had done the nebulizer for her convenience. She hasn't been seen in over a year, so can either make an appointment or if she is seeing PCP sooner they can fill it.

## 2024-05-22 NOTE — Telephone Encounter (Signed)
 I called and spoke with the pt's daughter and notified of response from Dr. Meade  She verbalized understanding  She will discuss with the pt and call back to schedule ov or call PCP for appt  Nothing further needed

## 2024-05-26 ENCOUNTER — Encounter: Payer: Self-pay | Admitting: Adult Health

## 2024-05-26 ENCOUNTER — Ambulatory Visit: Payer: Medicare Other | Admitting: Adult Health

## 2024-05-26 VITALS — BP 134/74 | HR 58 | Ht 62.0 in | Wt 103.0 lb

## 2024-05-26 DIAGNOSIS — F02B0 Dementia in other diseases classified elsewhere, moderate, without behavioral disturbance, psychotic disturbance, mood disturbance, and anxiety: Secondary | ICD-10-CM

## 2024-05-26 DIAGNOSIS — E785 Hyperlipidemia, unspecified: Secondary | ICD-10-CM | POA: Diagnosis not present

## 2024-05-26 DIAGNOSIS — Z9181 History of falling: Secondary | ICD-10-CM | POA: Diagnosis not present

## 2024-05-26 DIAGNOSIS — Z87891 Personal history of nicotine dependence: Secondary | ICD-10-CM | POA: Diagnosis not present

## 2024-05-26 DIAGNOSIS — I739 Peripheral vascular disease, unspecified: Secondary | ICD-10-CM | POA: Diagnosis not present

## 2024-05-26 DIAGNOSIS — Z86711 Personal history of pulmonary embolism: Secondary | ICD-10-CM | POA: Diagnosis not present

## 2024-05-26 DIAGNOSIS — Z86718 Personal history of other venous thrombosis and embolism: Secondary | ICD-10-CM | POA: Diagnosis not present

## 2024-05-26 DIAGNOSIS — I493 Ventricular premature depolarization: Secondary | ICD-10-CM | POA: Diagnosis not present

## 2024-05-26 DIAGNOSIS — M810 Age-related osteoporosis without current pathological fracture: Secondary | ICD-10-CM | POA: Diagnosis not present

## 2024-05-26 DIAGNOSIS — G309 Alzheimer's disease, unspecified: Secondary | ICD-10-CM | POA: Diagnosis not present

## 2024-05-26 DIAGNOSIS — D649 Anemia, unspecified: Secondary | ICD-10-CM | POA: Diagnosis not present

## 2024-05-26 DIAGNOSIS — I10 Essential (primary) hypertension: Secondary | ICD-10-CM | POA: Diagnosis not present

## 2024-05-26 NOTE — Progress Notes (Signed)
 PATIENT: Amanda [name] DOB: [birth year]  REASON FOR VISIT: follow up HISTORY FROM: patient, granddaughter  PRIMARY NEUROLOGIST: Dr. Buck  Chief Complaint  Patient presents with   Follow-up    RM17-- 6 month f/u-MMSE    HISTORY OF PRESENT ILLNESS: Today 05/26/24:  Amanda [name] is a 89 y.o. female with a history of Alzheimer's disease. Returns today for follow-up.  She is here today with her granddaughter.  She continues to live at home alone.  Reports that she is able to complete all ADLs independently.  She manages her own medications.  No longer operates a motor vehicle.  Denies any changes in her mood or behavior.  Remains on Aricept  10 mg daily.  Granddaughter states that physical therapy is coming out to work with her.  She was recently in the hospital for low blood counts.  They found that she did have a mass in her stomach fortunately is not cancerous but they are determining if it needs to be removed.  She returns today for an evaluation    12/26/22: Amanda [name] is a 89 y.o. female with a history of Alzheimer's disease. Returns today for follow-up.  She is here today with her granddaughter.  Her granddaughter feels that short-term memory has declined.  Patient continues to live in her home alone.  She is able to complete all ADLs independently.  She manages her own medication use and at pill pack.  Reports good appetite.  Denies any trouble sleeping.  Denies hallucinations.  No change in mood or behavior.  07/07/22:Amanda [name] is an 89 year old female with a history of memory disturbance.  She returns today for follow-up.  She reports that her memory has remained stable.  She Lives home alone. Able to complete all ADLs independently. Able to prepare her own meals. She manages her medications- uses a pill pack. If not in the pack she will forget to take. Good appetite. Sleeping ok. Family reports that she can be short-tempered at times. Denies hallucinations.   She was on Namenda  but was having dizziness.  Medication was discontinued.  Granddaughter reports that she still complains of dizziness not sure that the medication was the cause.  11/28/2021: Amanda [name] is an 89 year old female with a history of memory disturbance.  She returns today for follow-up.  She is here today with her granddaughter.  She reports that she continues to live at home alone.  She does have family that checks on her daily or every other day.  Her medications come in a pill pack.  Her family reminds her of her appointments.  Reports that she has a good appetite.  She reports that she has not been driving because the car was broke but she plans to resume and drive to the post office.  She was advised at her last office visit that she should not be driving.  She returns today for an evaluation.  HISTORY (copied from Dr. Obie note) 05/16/21: She reports doing fairly well, no recent falls, denies any loss of appetite, weight has been fairly stable within 2 to 3 pounds range.  Her granddaughters check on her.  She has 4 granddaughters, these are Amanda [name]'s daughters.  Patient's other daughter is in [place]  and visits frequently.  Her daughter in [place]  does not have any kids.  Patient drives to her grocery store or post office, she does cook some food on her stove.  She lives alone.  Daughter reports that she was summoned for jury duty  for this week but they are going to address this with the court house today.  She is tolerating the memantine  5 mg twice daily.  She has had intermittent dizziness but denies any significant lightheadedness.  Symptoms come and go, she denies any specific symptoms such as spinning sensation.  Sometimes she wonders if it is her nerves and she gets anxious at times.   The patient's allergies, current medications, family history, past medical history, past social history, past surgical history and problem list were reviewed and updated as appropriate.   REVIEW OF  SYSTEMS: Out of a complete 14 system review of symptoms, the patient complains only of the following symptoms, and all other reviewed systems are negative.  ALLERGIES: Allergies  Allergen Reactions   Memantine  Other (See Comments)    HOME MEDICATIONS: Outpatient Medications Prior to Visit  Medication Sig Dispense Refill   acetaminophen  (TYLENOL ) 325 MG tablet Take 2 tablets (650 mg total) by mouth every 6 (six) hours as needed for mild pain, moderate pain, fever or headache (or Fever >/= 101).     albuterol  (PROVENTIL ) (2.5 MG/3ML) 0.083% nebulizer solution Take 3 mLs (2.5 mg total) by nebulization every 6 (six) hours as needed for wheezing or shortness of breath. 75 mL 5   amLODipine  (NORVASC ) 2.5 MG tablet 1 tablet Orally Once a day; Duration: 90 days     arformoterol  (BROVANA ) 15 MCG/2ML NEBU Take 2 mLs (15 mcg total) by nebulization 2 (two) times daily. 120 mL 6   citalopram  (CELEXA ) 20 MG tablet Take 1 tablet (20 mg total) by mouth every morning. 30 tablet 0   cyanocobalamin  (VITAMIN B12) 1000 MCG tablet Take 1,000 mcg by mouth every morning.     Docusate Sodium (COLACE PO) Take by mouth.     donepezil  (ARICEPT ) 10 MG tablet Take 10 mg by mouth at bedtime.     famotidine (PEPCID) 20 MG tablet Take 20 mg by mouth daily.     ferrous sulfate  325 (65 FE) MG tablet Take 1 tablet (325 mg total) by mouth 2 (two) times daily with a meal. (Patient taking differently: Take 325 mg by mouth 2 (two) times a week.)     latanoprost  (XALATAN ) 0.005 % ophthalmic solution Place 1 drop into both eyes every evening.     metoprolol  succinate (TOPROL -XL) 25 MG 24 hr tablet Take 0.5 tablets (12.5 mg total) by mouth daily. 15 tablet 0   Multiple Vitamins-Minerals (CENTRUM SILVER PO) Take 1 tablet by mouth every morning.     pantoprazole  (PROTONIX ) 40 MG tablet Take 1 tablet (40 mg total) by mouth 2 (two) times daily. 60 tablet 0   revefenacin  (YUPELRI ) 175 MCG/3ML nebulizer solution Take 3 mLs (175 mcg total)  by nebulization daily. 90 mL 5   No facility-administered medications prior to visit.    PAST MEDICAL HISTORY: Past Medical History:  Diagnosis Date   Depression    DVT (deep venous thrombosis) (HCC)    Hyperlipidemia    Hypertension    Memory disorder    Osteoporosis    PVC (premature ventricular contraction)     PAST SURGICAL HISTORY: Past Surgical History:  Procedure Laterality Date   BIOPSY OF SKIN SUBCUTANEOUS TISSUE AND/OR MUCOUS MEMBRANE  04/28/2024   Procedure: BIOPSY, SKIN, SUBCUTANEOUS TISSUE, OR MUCOUS MEMBRANE;  Surgeon: Saintclair Jasper, MD;  Location: [facility] ENDOSCOPY;  Service: Gastroenterology;;   ESOPHAGOGASTRODUODENOSCOPY Left 04/28/2024   Procedure: EGD (ESOPHAGOGASTRODUODENOSCOPY);  Surgeon: Saintclair Jasper, MD;  Location: [facility] ENDOSCOPY;  Service: Gastroenterology;  Laterality:  Left;   ESOPHAGOGASTRODUODENOSCOPY N/A 05/01/2024   Procedure: EGD (ESOPHAGOGASTRODUODENOSCOPY);  Surgeon: Saintclair Jasper, MD;  Location: [facility] ENDOSCOPY;  Service: Gastroenterology;  Laterality: N/A;   PARTIAL HYSTERECTOMY      FAMILY HISTORY: Family History  Problem Relation Age of Onset   Alzheimer's disease Mother    Lung cancer Neg Hx    Memory loss Neg Hx     SOCIAL HISTORY: Social History   Socioeconomic History   Marital status: Divorced    Spouse name: Not on file   Number of children: Not on file   Years of education: Not on file   Highest education level: Not on file  Occupational History   Not on file  Tobacco Use   Smoking status: Former    Current packs/day: 0.00    Types: Cigarettes    Start date: 10/29/1954    Quit date: 10/30/1971    Years since quitting: 52.6   Smokeless tobacco: Never   Tobacco comments:    smoked 3-4/day  Substance and Sexual Activity   Alcohol use: No   Drug use: No   Sexual activity: Not on file  Other Topics Concern   Not on file  Social History Narrative   Worked in mills.   Divorced 10    Lives alone   Son lives in town and two daughter  lives out of town.       Social Drivers of Corporate investment banker Strain: Low Risk  (01/17/2023)   Overall Financial Resource Strain (CARDIA)    Difficulty of Paying Living Expenses: Not very hard  Food Insecurity: No Food Insecurity (05/13/2024)   Hunger Vital Sign    Worried About Running Out of Food in the Last Year: Never true    Ran Out of Food in the Last Year: Never true  Transportation Needs: No Transportation Needs (05/13/2024)   PRAPARE - Administrator, Civil Service (Medical): No    Lack of Transportation (Non-Medical): No  Physical Activity: Not on file  Stress: Not on file  Social Connections: Unknown (04/28/2024)   Social Connection and Isolation Panel    Frequency of Communication with Friends and Family: Three times a week    Frequency of Social Gatherings with Friends and Family: Twice a week    Attends Religious Services: Patient declined    Database administrator or Organizations: Patient declined    Attends Banker Meetings: 1 to 4 times per year    Marital Status: Not on file  Intimate Partner Violence: Unknown (05/13/2024)   Humiliation, Afraid, Rape, and Kick questionnaire    Fear of Current or Ex-Partner: No    Emotionally Abused: No    Physically Abused: No    Sexually Abused: Not on file      PHYSICAL EXAM  Vitals:   05/26/24 1115  BP: 134/74  Pulse: (!) 58  Weight: 103 lb (46.7 kg)  Height: 5' 2 (1.575 m)    Body mass index is 18.84 kg/m.     05/26/2024   11:18 AM 12/26/2022    2:53 PM 07/07/2022    9:38 AM  MMSE - Mini Mental State Exam  Orientation to time 2 1 2   Orientation to Place 4 4 4   Registration 3 3 3   Attention/ Calculation 0 0 0  Recall 0 0 0  Language- name 2 objects 2 1 2   Language- repeat 1 1 1   Language- follow 3 step command 2 3 3   Language-  read & follow direction 1 1 1   Write a sentence 1 1 1   Copy design 0 0 1  Total score 16 15 18      Generalized: Well developed, in no acute  distress   Neurological examination  Mentation: Alert oriented to time, place, history taking. Follows all commands speech and language fluent Cranial nerve II-XII: Pupils were equal round reactive to light. Extraocular movements were full, visual field were full on confrontational test. Facial sensation and strength were normal. Uvula tongue midline. Head turning and shoulder shrug  were normal and symmetric. Motor: The motor testing reveals 5 over 5 strength of all 4 extremities. Good symmetric motor tone is noted throughout.  Sensory: Sensory testing is intact to soft touch on all 4 extremities. No evidence of extinction is noted.  Coordination: Cerebellar testing reveals good finger-nose-finger and heel-to-shin bilaterally.  Gait and station: Gait is normal. Reflexes: Deep tendon reflexes are symmetric and normal bilaterally.   DIAGNOSTIC DATA (LABS, IMAGING, TESTING) - I reviewed patient records, labs, notes, testing and imaging myself where available.  Lab Results  Component Value Date   WBC 6.9 05/13/2024   HGB 10.9 (L) 05/13/2024   HCT 35.7 (L) 05/13/2024   MCV 91.8 05/13/2024   PLT 276 05/13/2024      Component Value Date/Time   NA 141 05/13/2024 1315   K 4.0 05/13/2024 1315   CL 104 05/13/2024 1315   CO2 26 05/13/2024 1315   GLUCOSE 90 05/13/2024 1315   BUN 15 05/13/2024 1315   CREATININE 0.77 05/13/2024 1315   CALCIUM 9.3 05/13/2024 1315   PROT 6.7 05/13/2024 1315   ALBUMIN 3.8 05/13/2024 1315   AST 25 05/13/2024 1315   ALT 12 05/13/2024 1315   ALKPHOS 53 05/13/2024 1315   BILITOT 0.3 05/13/2024 1315   GFRNONAA >60 05/13/2024 1315   GFRAA >60 05/04/2020 1237   Lab Results  Component Value Date   CHOL 167 01/06/2015   HDL 40 01/06/2015   LDLCALC 116 (H) 01/06/2015   TRIG 53 01/06/2015   CHOLHDL 4.2 01/06/2015   Lab Results  Component Value Date   HGBA1C 6.1 (H) 01/06/2015   Lab Results  Component Value Date   VITAMINB12 1,429 (H) 04/26/2024   Lab  Results  Component Value Date   TSH 0.88 03/01/2016      ASSESSMENT AND PLAN 89 y.o. year old female  has a past medical history of Depression, DVT (deep venous thrombosis) (HCC), Hyperlipidemia, Hypertension, Memory disorder, Osteoporosis, and PVC (premature ventricular contraction). here with :  1.  Dementia without behavioral disturbance  Continue Aricept  10 mg daily MMSE 16/30 Previously 17/30 Advised if symptoms worsen or she develops new symptoms she should let us  know Follow-up in 6 months or sooner if needed     Duwaine Russell, MSN, NP-C 05/26/2024, 11:22 AM [facility] [address] [place], [place] [address] [phone]

## 2024-05-26 NOTE — Patient Instructions (Addendum)
Your Plan:  Continue Aricept 10 mg at bedtime If your symptoms worsen or you develop new symptoms please let us know.   Thank you for coming to see us at [facility]. I hope we have been able to provide you high quality care today.  You may receive a patient satisfaction survey over the next few weeks. We would appreciate your feedback and comments so that we may continue to improve ourselves and the health of our patients.  

## 2024-05-30 DIAGNOSIS — M810 Age-related osteoporosis without current pathological fracture: Secondary | ICD-10-CM | POA: Diagnosis not present

## 2024-05-30 DIAGNOSIS — Z87891 Personal history of nicotine dependence: Secondary | ICD-10-CM | POA: Diagnosis not present

## 2024-05-30 DIAGNOSIS — Z86718 Personal history of other venous thrombosis and embolism: Secondary | ICD-10-CM | POA: Diagnosis not present

## 2024-05-30 DIAGNOSIS — Z86711 Personal history of pulmonary embolism: Secondary | ICD-10-CM | POA: Diagnosis not present

## 2024-05-30 DIAGNOSIS — I739 Peripheral vascular disease, unspecified: Secondary | ICD-10-CM | POA: Diagnosis not present

## 2024-05-30 DIAGNOSIS — I10 Essential (primary) hypertension: Secondary | ICD-10-CM | POA: Diagnosis not present

## 2024-05-30 DIAGNOSIS — D649 Anemia, unspecified: Secondary | ICD-10-CM | POA: Diagnosis not present

## 2024-05-30 DIAGNOSIS — G309 Alzheimer's disease, unspecified: Secondary | ICD-10-CM | POA: Diagnosis not present

## 2024-05-30 DIAGNOSIS — Z9181 History of falling: Secondary | ICD-10-CM | POA: Diagnosis not present

## 2024-05-30 DIAGNOSIS — I493 Ventricular premature depolarization: Secondary | ICD-10-CM | POA: Diagnosis not present

## 2024-05-30 DIAGNOSIS — E785 Hyperlipidemia, unspecified: Secondary | ICD-10-CM | POA: Diagnosis not present

## 2024-06-02 DIAGNOSIS — G309 Alzheimer's disease, unspecified: Secondary | ICD-10-CM | POA: Diagnosis not present

## 2024-06-02 DIAGNOSIS — E785 Hyperlipidemia, unspecified: Secondary | ICD-10-CM | POA: Diagnosis not present

## 2024-06-02 DIAGNOSIS — Z86718 Personal history of other venous thrombosis and embolism: Secondary | ICD-10-CM | POA: Diagnosis not present

## 2024-06-02 DIAGNOSIS — Z9181 History of falling: Secondary | ICD-10-CM | POA: Diagnosis not present

## 2024-06-02 DIAGNOSIS — M810 Age-related osteoporosis without current pathological fracture: Secondary | ICD-10-CM | POA: Diagnosis not present

## 2024-06-02 DIAGNOSIS — I493 Ventricular premature depolarization: Secondary | ICD-10-CM | POA: Diagnosis not present

## 2024-06-02 DIAGNOSIS — Z87891 Personal history of nicotine dependence: Secondary | ICD-10-CM | POA: Diagnosis not present

## 2024-06-02 DIAGNOSIS — I739 Peripheral vascular disease, unspecified: Secondary | ICD-10-CM | POA: Diagnosis not present

## 2024-06-02 DIAGNOSIS — Z86711 Personal history of pulmonary embolism: Secondary | ICD-10-CM | POA: Diagnosis not present

## 2024-06-02 DIAGNOSIS — I10 Essential (primary) hypertension: Secondary | ICD-10-CM | POA: Diagnosis not present

## 2024-06-02 DIAGNOSIS — D649 Anemia, unspecified: Secondary | ICD-10-CM | POA: Diagnosis not present

## 2024-06-04 DIAGNOSIS — D649 Anemia, unspecified: Secondary | ICD-10-CM | POA: Diagnosis not present

## 2024-06-04 DIAGNOSIS — G309 Alzheimer's disease, unspecified: Secondary | ICD-10-CM | POA: Diagnosis not present

## 2024-06-04 DIAGNOSIS — Z87891 Personal history of nicotine dependence: Secondary | ICD-10-CM | POA: Diagnosis not present

## 2024-06-04 DIAGNOSIS — Z86718 Personal history of other venous thrombosis and embolism: Secondary | ICD-10-CM | POA: Diagnosis not present

## 2024-06-04 DIAGNOSIS — I10 Essential (primary) hypertension: Secondary | ICD-10-CM | POA: Diagnosis not present

## 2024-06-04 DIAGNOSIS — Z86711 Personal history of pulmonary embolism: Secondary | ICD-10-CM | POA: Diagnosis not present

## 2024-06-04 DIAGNOSIS — Z9181 History of falling: Secondary | ICD-10-CM | POA: Diagnosis not present

## 2024-06-04 DIAGNOSIS — I739 Peripheral vascular disease, unspecified: Secondary | ICD-10-CM | POA: Diagnosis not present

## 2024-06-04 DIAGNOSIS — E785 Hyperlipidemia, unspecified: Secondary | ICD-10-CM | POA: Diagnosis not present

## 2024-06-04 DIAGNOSIS — M810 Age-related osteoporosis without current pathological fracture: Secondary | ICD-10-CM | POA: Diagnosis not present

## 2024-06-04 DIAGNOSIS — I493 Ventricular premature depolarization: Secondary | ICD-10-CM | POA: Diagnosis not present

## 2024-06-13 DIAGNOSIS — R300 Dysuria: Secondary | ICD-10-CM | POA: Diagnosis not present

## 2024-06-13 DIAGNOSIS — I10 Essential (primary) hypertension: Secondary | ICD-10-CM | POA: Diagnosis not present

## 2024-06-13 DIAGNOSIS — D131 Benign neoplasm of stomach: Secondary | ICD-10-CM | POA: Diagnosis not present

## 2024-06-13 DIAGNOSIS — D509 Iron deficiency anemia, unspecified: Secondary | ICD-10-CM | POA: Diagnosis not present

## 2024-06-13 DIAGNOSIS — Z742 Need for assistance at home and no other household member able to render care: Secondary | ICD-10-CM | POA: Diagnosis not present

## 2024-06-13 DIAGNOSIS — R54 Age-related physical debility: Secondary | ICD-10-CM | POA: Diagnosis not present

## 2024-06-16 ENCOUNTER — Ambulatory Visit (HOSPITAL_BASED_OUTPATIENT_CLINIC_OR_DEPARTMENT_OTHER)
Admission: RE | Admit: 2024-06-16 | Discharge: 2024-06-16 | Disposition: A | Discharge status: Home / Self Care | Source: Ambulatory Visit | Attending: Family Medicine | Admitting: Family Medicine

## 2024-06-16 DIAGNOSIS — Z780 Asymptomatic menopausal state: Secondary | ICD-10-CM | POA: Diagnosis not present

## 2024-06-16 DIAGNOSIS — M810 Age-related osteoporosis without current pathological fracture: Secondary | ICD-10-CM | POA: Insufficient documentation

## 2024-06-17 ENCOUNTER — Inpatient Hospital Stay: Attending: Oncology

## 2024-06-17 ENCOUNTER — Inpatient Hospital Stay: Admitting: Oncology

## 2024-06-17 ENCOUNTER — Telehealth: Payer: Self-pay | Admitting: Oncology

## 2024-06-17 ENCOUNTER — Ambulatory Visit: Payer: Self-pay | Admitting: Oncology

## 2024-06-17 VITALS — BP 171/90 | HR 57 | Temp 97.8°F | Resp 18 | Ht 62.0 in | Wt 105.4 lb

## 2024-06-17 DIAGNOSIS — Z86711 Personal history of pulmonary embolism: Secondary | ICD-10-CM | POA: Diagnosis not present

## 2024-06-17 DIAGNOSIS — I10 Essential (primary) hypertension: Secondary | ICD-10-CM | POA: Insufficient documentation

## 2024-06-17 DIAGNOSIS — F0390 Unspecified dementia without behavioral disturbance: Secondary | ICD-10-CM | POA: Insufficient documentation

## 2024-06-17 DIAGNOSIS — D132 Benign neoplasm of duodenum: Secondary | ICD-10-CM | POA: Insufficient documentation

## 2024-06-17 DIAGNOSIS — D649 Anemia, unspecified: Secondary | ICD-10-CM

## 2024-06-17 DIAGNOSIS — Z86718 Personal history of other venous thrombosis and embolism: Secondary | ICD-10-CM | POA: Insufficient documentation

## 2024-06-17 DIAGNOSIS — D500 Iron deficiency anemia secondary to blood loss (chronic): Secondary | ICD-10-CM | POA: Diagnosis not present

## 2024-06-17 DIAGNOSIS — Z7901 Long term (current) use of anticoagulants: Secondary | ICD-10-CM | POA: Insufficient documentation

## 2024-06-17 LAB — SAMPLE TO BLOOD BANK

## 2024-06-17 LAB — CBC WITH DIFFERENTIAL (CANCER CENTER ONLY)
Abs Immature Granulocytes: 0.01 K/uL (ref 0.00–0.07)
Basophils Absolute: 0.1 K/uL (ref 0.0–0.1)
Basophils Relative: 1 %
Eosinophils Absolute: 0.1 K/uL (ref 0.0–0.5)
Eosinophils Relative: 2 %
HCT: 41.2 % (ref 36.0–46.0)
Hemoglobin: 12.9 g/dL (ref 12.0–15.0)
Immature Granulocytes: 0 %
Lymphocytes Relative: 18 %
Lymphs Abs: 1.1 K/uL (ref 0.7–4.0)
MCH: 28.6 pg (ref 26.0–34.0)
MCHC: 31.3 g/dL (ref 30.0–36.0)
MCV: 91.4 fL (ref 80.0–100.0)
Monocytes Absolute: 0.6 K/uL (ref 0.1–1.0)
Monocytes Relative: 9 %
Neutro Abs: 4.3 K/uL (ref 1.7–7.7)
Neutrophils Relative %: 70 %
Platelet Count: 244 K/uL (ref 150–400)
RBC: 4.51 MIL/uL (ref 3.87–5.11)
RDW: 16.7 % — ABNORMAL HIGH (ref 11.5–15.5)
WBC Count: 6 K/uL (ref 4.0–10.5)
nRBC: 0 % (ref 0.0–0.2)

## 2024-06-17 LAB — FERRITIN: Ferritin: 34 ng/mL (ref 11–307)

## 2024-06-17 NOTE — Progress Notes (Signed)
 [name] OFFICE PROGRESS NOTE   Diagnosis: Duodenal adenoma, history of iron deficiency anemia  INTERVAL HISTORY:   Amanda [name] returns as scheduled.  She is here with her daughter.  She is taking iron once daily.  She is having bowel movements.  No bleeding.  She began treatment with Keflex for a UTI on 06/13/2024.  She has reflux symptoms.  No emesis.  Her daughter reports Dr. Rolinda made a referral to gastroenterology at [facility] to consider endoscopic resection of the adenoma. An appointment has not been scheduled. Objective:  Vital signs in last 24 hours:  Blood pressure (!) 171/90, pulse (!) 57, temperature 97.8 F (36.6 C), temperature source Temporal, resp. rate 18, height 5' 2 (1.575 m), weight 105 lb 6.4 oz (47.8 kg), SpO2 97%.    HEENT: No thrush Resp: Coarse inspiratory rhonchi at the left greater than right posterior base, decreased breath sounds at the left compared to the right lower posterior chest Cardio: Regular rate and rhythm GI: No hepatosplenomegaly, mild tenderness in the subxiphoid region, no mass Vascular: No leg edema  Lab Results:  Lab Results  Component Value Date   WBC 6.0 06/17/2024   HGB 12.9 06/17/2024   HCT 41.2 06/17/2024   MCV 91.4 06/17/2024   PLT 244 06/17/2024   NEUTROABS 4.3 06/17/2024    CMP  Lab Results  Component Value Date   NA 141 05/13/2024   K 4.0 05/13/2024   CL 104 05/13/2024   CO2 26 05/13/2024   GLUCOSE 90 05/13/2024   BUN 15 05/13/2024   CREATININE 0.77 05/13/2024   CALCIUM 9.3 05/13/2024   PROT 6.7 05/13/2024   ALBUMIN 3.8 05/13/2024   AST 25 05/13/2024   ALT 12 05/13/2024   ALKPHOS 53 05/13/2024   BILITOT 0.3 05/13/2024   GFRNONAA >60 05/13/2024   GFRAA >60 05/04/2020    Lab Results  Component Value Date   CEA1 1.1 04/28/2024   RJW800 9 04/28/2024    No results found for: INR, LABPROT  Imaging:  DG BONE DENSITY (DXA) Result Date: 06/16/2024 EXAM: DUAL X-RAY ABSORPTIOMETRY (DXA)  FOR BONE MINERAL DENSITY 06/16/2024 10:55 am CLINICAL DATA:  89 year old Female Postmenopausal. M81.0-Age-related osteoporosis without a current pathological fracture TECHNIQUE: An axial (e.g., hips, spine) and/or appendicular (e.g., radius) exam was performed, as appropriate, using GE Secretary/administrator at [employer]. Images are obtained for bone mineral density measurement and are not obtained for diagnostic purposes. [id number] Exclusions: None. COMPARISON:  None. New baseline. FINDINGS: Scan quality: Good. LUMBAR SPINE (L1-L4): BMD (in g/cm2): 0.811 T-score: -3.1 Z-score: -1.1 LEFT FEMORAL NECK: BMD (in g/cm2): 0.813 T-score: -1.6 Z-score: 0.9 LEFT TOTAL HIP: BMD (in g/cm2): 0.879 T-score: -1.0 Z-score: 1.5 RIGHT FEMORAL NECK: BMD (in g/cm2): 0.858 T-score: -1.3 Z-score: 1.3 RIGHT TOTAL HIP: BMD (in g/cm2): 0.889 T-score: -0.9 Z-score: 1.5 FRAX 10-YEAR PROBABILITY OF FRACTURE: FRAX not reported as the lowest BMD is not in the osteopenia range. IMPRESSION: Osteoporosis based on BMD. Fracture risk is increased. Increased risk is based on low BMD. RECOMMENDATIONS: 1. All patients should optimize calcium and vitamin D intake. 2. Consider FDA-approved medical therapies in postmenopausal women and men aged 65 years and older, based on the following: - A hip or vertebral (clinical or morphometric) fracture - T-score less than or equal to -2.5 and secondary causes have been excluded. - Low bone mass (T-score between -1.0 and -2.5) and a 10-year probability of a hip fracture greater than or equal to  3% or a 10-year probability of a major osteoporosis-related fracture greater than or equal to 20% based on the [place] -adapted WHO algorithm. - Clinician judgment and/or patient preferences may indicate treatment for people with 10-year fracture probabilities above or below these levels 3. Patients with diagnosis of osteoporosis or at high risk for fracture should have regular bone mineral density tests. For  patients eligible for Medicare, routine testing is allowed once every 2 years. The testing frequency can be increased to one year for patients who have rapidly progressing disease, those who are receiving or discontinuing medical therapy to restore bone mass, or have additional risk factors. Electronically Signed   By: Amanda [name] M.D.   On: 06/16/2024 16:57    Medications: I have reviewed the patient's current medications.   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   Iron deficiency anemia secondary to #1, status post 2 units packed red blood cells 04/26/2024, 1 unit packed red cells 04/30/2024 3.   Mild dementia 4.   Hypertension 5.   History of DVT/pulmonary embolism, maintained on rivaroxaban  prior to hospital admission Pulmonary embolism 12/02/2022, indefinite anticoagulation recommended Left leg DVT November 2021, Doppler 11/24/2019-[facility], thrombus in a calf vein tributary of the popliteal, deep venous system patent, Xarelto  (family reports approximately 6 weeks of anticoagulation therapy) 6.   History of depression 7.   COPD 8.   Hyperlipidemia 9.   Left arm phlebitis associated with an IV June 2025       Disposition: Amanda [name] has a history of iron deficiency anemia secondary to bleeding from a pyloric channel/duodenal adenoma.  The hemoglobin is now in the normal range.  She will continue iron. She remains off of anticoagulation therapy given the history of bleeding with a remaining upper gastrointestinal mass.  She has been referred to [facility]  gastroenterology to consider an EUS/endoscopic resection of the adenoma.  Amanda [name] will return for an office and lab visit in 3 months.  Amanda Hof, MD  06/17/2024  10:56 AM

## 2024-06-18 NOTE — Telephone Encounter (Signed)
 LVM for daughter that ferritin returned in normal range.

## 2024-06-19 ENCOUNTER — Telehealth: Payer: Self-pay | Admitting: *Deleted

## 2024-06-19 NOTE — Telephone Encounter (Signed)
 Notified daughter of normal ferritin result and to continue oral iron and f/u as scheduled.

## 2024-06-25 DIAGNOSIS — J432 Centrilobular emphysema: Secondary | ICD-10-CM | POA: Diagnosis not present

## 2024-06-25 DIAGNOSIS — Z87891 Personal history of nicotine dependence: Secondary | ICD-10-CM | POA: Diagnosis not present

## 2024-06-25 DIAGNOSIS — D131 Benign neoplasm of stomach: Secondary | ICD-10-CM | POA: Diagnosis not present

## 2024-06-25 DIAGNOSIS — I5022 Chronic systolic (congestive) heart failure: Secondary | ICD-10-CM | POA: Diagnosis not present

## 2024-06-25 DIAGNOSIS — Z86718 Personal history of other venous thrombosis and embolism: Secondary | ICD-10-CM | POA: Diagnosis not present

## 2024-06-25 DIAGNOSIS — D509 Iron deficiency anemia, unspecified: Secondary | ICD-10-CM | POA: Diagnosis not present

## 2024-06-25 DIAGNOSIS — G43909 Migraine, unspecified, not intractable, without status migrainosus: Secondary | ICD-10-CM | POA: Diagnosis not present

## 2024-06-25 DIAGNOSIS — Z86711 Personal history of pulmonary embolism: Secondary | ICD-10-CM | POA: Diagnosis not present

## 2024-06-25 DIAGNOSIS — M810 Age-related osteoporosis without current pathological fracture: Secondary | ICD-10-CM | POA: Diagnosis not present

## 2024-06-25 DIAGNOSIS — I110 Hypertensive heart disease with heart failure: Secondary | ICD-10-CM | POA: Diagnosis not present

## 2024-06-26 DIAGNOSIS — G43909 Migraine, unspecified, not intractable, without status migrainosus: Secondary | ICD-10-CM | POA: Diagnosis not present

## 2024-06-26 DIAGNOSIS — Z86711 Personal history of pulmonary embolism: Secondary | ICD-10-CM | POA: Diagnosis not present

## 2024-06-26 DIAGNOSIS — D131 Benign neoplasm of stomach: Secondary | ICD-10-CM | POA: Diagnosis not present

## 2024-06-26 DIAGNOSIS — M810 Age-related osteoporosis without current pathological fracture: Secondary | ICD-10-CM | POA: Diagnosis not present

## 2024-06-26 DIAGNOSIS — Z86718 Personal history of other venous thrombosis and embolism: Secondary | ICD-10-CM | POA: Diagnosis not present

## 2024-06-26 DIAGNOSIS — I110 Hypertensive heart disease with heart failure: Secondary | ICD-10-CM | POA: Diagnosis not present

## 2024-06-26 DIAGNOSIS — J432 Centrilobular emphysema: Secondary | ICD-10-CM | POA: Diagnosis not present

## 2024-06-26 DIAGNOSIS — I5022 Chronic systolic (congestive) heart failure: Secondary | ICD-10-CM | POA: Diagnosis not present

## 2024-06-26 DIAGNOSIS — D509 Iron deficiency anemia, unspecified: Secondary | ICD-10-CM | POA: Diagnosis not present

## 2024-06-26 DIAGNOSIS — Z87891 Personal history of nicotine dependence: Secondary | ICD-10-CM | POA: Diagnosis not present

## 2024-06-30 DIAGNOSIS — K3189 Other diseases of stomach and duodenum: Secondary | ICD-10-CM | POA: Diagnosis not present

## 2024-07-01 ENCOUNTER — Ambulatory Visit: Admitting: Podiatry

## 2024-07-01 ENCOUNTER — Encounter: Payer: Self-pay | Admitting: Podiatry

## 2024-07-01 DIAGNOSIS — D689 Coagulation defect, unspecified: Secondary | ICD-10-CM

## 2024-07-01 DIAGNOSIS — M79674 Pain in right toe(s): Secondary | ICD-10-CM

## 2024-07-01 DIAGNOSIS — L84 Corns and callosities: Secondary | ICD-10-CM

## 2024-07-01 DIAGNOSIS — G43909 Migraine, unspecified, not intractable, without status migrainosus: Secondary | ICD-10-CM | POA: Diagnosis not present

## 2024-07-01 DIAGNOSIS — I5022 Chronic systolic (congestive) heart failure: Secondary | ICD-10-CM | POA: Diagnosis not present

## 2024-07-01 DIAGNOSIS — M79675 Pain in left toe(s): Secondary | ICD-10-CM | POA: Diagnosis not present

## 2024-07-01 DIAGNOSIS — Z86718 Personal history of other venous thrombosis and embolism: Secondary | ICD-10-CM | POA: Diagnosis not present

## 2024-07-01 DIAGNOSIS — Z86711 Personal history of pulmonary embolism: Secondary | ICD-10-CM | POA: Diagnosis not present

## 2024-07-01 DIAGNOSIS — D509 Iron deficiency anemia, unspecified: Secondary | ICD-10-CM | POA: Diagnosis not present

## 2024-07-01 DIAGNOSIS — I110 Hypertensive heart disease with heart failure: Secondary | ICD-10-CM | POA: Diagnosis not present

## 2024-07-01 DIAGNOSIS — J432 Centrilobular emphysema: Secondary | ICD-10-CM | POA: Diagnosis not present

## 2024-07-01 DIAGNOSIS — B351 Tinea unguium: Secondary | ICD-10-CM | POA: Diagnosis not present

## 2024-07-01 DIAGNOSIS — Q828 Other specified congenital malformations of skin: Secondary | ICD-10-CM

## 2024-07-01 DIAGNOSIS — I739 Peripheral vascular disease, unspecified: Secondary | ICD-10-CM

## 2024-07-01 DIAGNOSIS — D131 Benign neoplasm of stomach: Secondary | ICD-10-CM | POA: Diagnosis not present

## 2024-07-01 DIAGNOSIS — Z87891 Personal history of nicotine dependence: Secondary | ICD-10-CM | POA: Diagnosis not present

## 2024-07-01 DIAGNOSIS — M810 Age-related osteoporosis without current pathological fracture: Secondary | ICD-10-CM | POA: Diagnosis not present

## 2024-07-02 DIAGNOSIS — I10 Essential (primary) hypertension: Secondary | ICD-10-CM | POA: Diagnosis not present

## 2024-07-02 DIAGNOSIS — K317 Polyp of stomach and duodenum: Secondary | ICD-10-CM | POA: Diagnosis not present

## 2024-07-02 DIAGNOSIS — J449 Chronic obstructive pulmonary disease, unspecified: Secondary | ICD-10-CM | POA: Diagnosis not present

## 2024-07-02 DIAGNOSIS — K3189 Other diseases of stomach and duodenum: Secondary | ICD-10-CM | POA: Diagnosis not present

## 2024-07-02 DIAGNOSIS — Z79899 Other long term (current) drug therapy: Secondary | ICD-10-CM | POA: Diagnosis not present

## 2024-07-02 DIAGNOSIS — D131 Benign neoplasm of stomach: Secondary | ICD-10-CM | POA: Diagnosis not present

## 2024-07-06 NOTE — Progress Notes (Signed)
  Subjective:  Patient ID: Amanda [name], female    DOB: Jul 06, 1933,  MRN: [id number]  Amanda [name] presents to clinic today for at risk foot care. Patient has h/o PAD and painful porokeratotic lesion(s) of both feet and painful mycotic toenails that limit ambulation. Painful toenails interfere with ambulation. Aggravating factors include wearing enclosed shoe gear. Pain is relieved with periodic professional debridement. Painful porokeratotic lesions are aggravated when weightbearing with and without shoegear. Pain is relieved with periodic professional debridement. She is accompanied by her family member on today's visit. Chief Complaint  Patient presents with   RFC    RM16 Routine foot care/Dr. Merri last visit August 2025   New problem(s): None.   PCP is Rolinda Millman, MD.  Allergies  Allergen Reactions   Memantine  Other (See Comments)    Review of Systems: Negative except as noted in the HPI.  Objective: No changes noted in today's physical examination. There were no vitals filed for this visit. Amanda [name] is a pleasant 89 y.o. female in NAD. AAO x 3.  Vascular Examination: CFT <4 seconds b/l. DP pulses diminished b/l. PT pulses diminished b/l. Digital hair absent. Skin temperature gradient warm to cool b/l. No ischemia or gangrene. No cyanosis or clubbing noted b/l.    Neurological Examination: Sensation grossly intact b/l with 10 gram monofilament.  Dermatological Examination: Pedal skin thin, shiny and atrophic b/l. No open wounds. No interdigital macerations.   Toenails 1-5 b/l thick, discolored, elongated with subungual debris and pain on dorsal palpation.   Hyperkeratotic lesion(s) dorsal PIPJ of R 4th toe.  No erythema, no edema, no drainage, no fluctuance. Porokeratotic lesion(s) right heel, submet head 1 right foot, and submet head 5 left foot. No erythema, no edema, no drainage, no fluctuance.  Musculoskeletal Examination: Muscle  strength 5/5 to all lower extremity muscle groups bilaterally. Hammertoe deformity noted 2-5 b/l.Amanda [name] No pain, crepitus or joint limitation noted with ROM b/l LE.  Patient ambulates independently without assistive aids.  Radiographs: None  Assessment/Plan: 1. Pain due to onychomycosis of toenails of both feet   2. Porokeratosis   3. Corns   4. Clotting disorder (HCC)   5. PAD (peripheral artery disease) (HCC)    Patient was evaluated and treated. All patient's and/or POA's questions/concerns addressed on today's visit. Toenails 1-5 debrided in length and girth without incident. Corn dorsal 4th PIPJ debrided right foot without incident. Porokeratotic lesion(s) right heel, submet head 1 right foot, and submet head 5 left foot pared with sharp debridement without incident. Continue soft, supportive shoe gear daily. Report any pedal injuries to medical professional. Call office if there are any questions/concerns.  Return in about 3 months (around 10/01/2024).  Amanda [name], DPM      [facility] LOCATION: [address], [place] [address]                   Office [phone]   [facility] LOCATION: [address] [place], [place] [address] Office [phone]

## 2024-07-07 DIAGNOSIS — Z87891 Personal history of nicotine dependence: Secondary | ICD-10-CM | POA: Diagnosis not present

## 2024-07-07 DIAGNOSIS — M810 Age-related osteoporosis without current pathological fracture: Secondary | ICD-10-CM | POA: Diagnosis not present

## 2024-07-07 DIAGNOSIS — I5022 Chronic systolic (congestive) heart failure: Secondary | ICD-10-CM | POA: Diagnosis not present

## 2024-07-07 DIAGNOSIS — D509 Iron deficiency anemia, unspecified: Secondary | ICD-10-CM | POA: Diagnosis not present

## 2024-07-07 DIAGNOSIS — G43909 Migraine, unspecified, not intractable, without status migrainosus: Secondary | ICD-10-CM | POA: Diagnosis not present

## 2024-07-07 DIAGNOSIS — Z86718 Personal history of other venous thrombosis and embolism: Secondary | ICD-10-CM | POA: Diagnosis not present

## 2024-07-07 DIAGNOSIS — D131 Benign neoplasm of stomach: Secondary | ICD-10-CM | POA: Diagnosis not present

## 2024-07-07 DIAGNOSIS — J432 Centrilobular emphysema: Secondary | ICD-10-CM | POA: Diagnosis not present

## 2024-07-07 DIAGNOSIS — I110 Hypertensive heart disease with heart failure: Secondary | ICD-10-CM | POA: Diagnosis not present

## 2024-07-07 DIAGNOSIS — Z86711 Personal history of pulmonary embolism: Secondary | ICD-10-CM | POA: Diagnosis not present

## 2024-07-08 DIAGNOSIS — J441 Chronic obstructive pulmonary disease with (acute) exacerbation: Secondary | ICD-10-CM | POA: Diagnosis not present

## 2024-07-08 DIAGNOSIS — R5383 Other fatigue: Secondary | ICD-10-CM | POA: Diagnosis not present

## 2024-07-16 DIAGNOSIS — I10 Essential (primary) hypertension: Secondary | ICD-10-CM | POA: Diagnosis not present

## 2024-07-16 DIAGNOSIS — D72829 Elevated white blood cell count, unspecified: Secondary | ICD-10-CM | POA: Diagnosis not present

## 2024-07-16 DIAGNOSIS — I214 Non-ST elevation (NSTEMI) myocardial infarction: Secondary | ICD-10-CM | POA: Diagnosis not present

## 2024-07-16 DIAGNOSIS — Z87891 Personal history of nicotine dependence: Secondary | ICD-10-CM | POA: Diagnosis not present

## 2024-07-16 DIAGNOSIS — J9811 Atelectasis: Secondary | ICD-10-CM | POA: Diagnosis not present

## 2024-07-17 DIAGNOSIS — Z86718 Personal history of other venous thrombosis and embolism: Secondary | ICD-10-CM | POA: Diagnosis not present

## 2024-07-17 DIAGNOSIS — I517 Cardiomegaly: Secondary | ICD-10-CM | POA: Diagnosis not present

## 2024-07-17 DIAGNOSIS — D72829 Elevated white blood cell count, unspecified: Secondary | ICD-10-CM | POA: Diagnosis not present

## 2024-07-17 DIAGNOSIS — I21A1 Myocardial infarction type 2: Secondary | ICD-10-CM | POA: Diagnosis not present

## 2024-07-17 DIAGNOSIS — R0789 Other chest pain: Secondary | ICD-10-CM | POA: Diagnosis not present

## 2024-07-17 DIAGNOSIS — R7989 Other specified abnormal findings of blood chemistry: Secondary | ICD-10-CM | POA: Diagnosis not present

## 2024-07-17 DIAGNOSIS — U099 Post covid-19 condition, unspecified: Secondary | ICD-10-CM | POA: Diagnosis not present

## 2024-07-17 DIAGNOSIS — I2693 Single subsegmental pulmonary embolism without acute cor pulmonale: Secondary | ICD-10-CM | POA: Diagnosis not present

## 2024-07-17 DIAGNOSIS — I772 Rupture of artery: Secondary | ICD-10-CM | POA: Diagnosis not present

## 2024-07-17 DIAGNOSIS — J9621 Acute and chronic respiratory failure with hypoxia: Secondary | ICD-10-CM | POA: Diagnosis not present

## 2024-07-17 DIAGNOSIS — I10 Essential (primary) hypertension: Secondary | ICD-10-CM | POA: Diagnosis not present

## 2024-07-17 DIAGNOSIS — M3581 Multisystem inflammatory syndrome: Secondary | ICD-10-CM | POA: Diagnosis not present

## 2024-07-17 DIAGNOSIS — I82451 Acute embolism and thrombosis of right peroneal vein: Secondary | ICD-10-CM | POA: Diagnosis not present

## 2024-07-17 DIAGNOSIS — K3189 Other diseases of stomach and duodenum: Secondary | ICD-10-CM | POA: Diagnosis not present

## 2024-07-17 DIAGNOSIS — J440 Chronic obstructive pulmonary disease with acute lower respiratory infection: Secondary | ICD-10-CM | POA: Diagnosis not present

## 2024-07-17 DIAGNOSIS — Z86711 Personal history of pulmonary embolism: Secondary | ICD-10-CM | POA: Diagnosis not present

## 2024-07-17 DIAGNOSIS — J449 Chronic obstructive pulmonary disease, unspecified: Secondary | ICD-10-CM | POA: Diagnosis not present

## 2024-07-17 DIAGNOSIS — I82409 Acute embolism and thrombosis of unspecified deep veins of unspecified lower extremity: Secondary | ICD-10-CM | POA: Diagnosis not present

## 2024-07-17 DIAGNOSIS — Z87891 Personal history of nicotine dependence: Secondary | ICD-10-CM | POA: Diagnosis not present

## 2024-07-17 DIAGNOSIS — D131 Benign neoplasm of stomach: Secondary | ICD-10-CM | POA: Diagnosis not present

## 2024-07-17 DIAGNOSIS — J441 Chronic obstructive pulmonary disease with (acute) exacerbation: Secondary | ICD-10-CM | POA: Diagnosis not present

## 2024-07-17 DIAGNOSIS — K9289 Other specified diseases of the digestive system: Secondary | ICD-10-CM | POA: Diagnosis not present

## 2024-07-17 DIAGNOSIS — I214 Non-ST elevation (NSTEMI) myocardial infarction: Secondary | ICD-10-CM | POA: Diagnosis not present

## 2024-07-17 DIAGNOSIS — G308 Other Alzheimer's disease: Secondary | ICD-10-CM | POA: Diagnosis not present

## 2024-07-17 DIAGNOSIS — I2699 Other pulmonary embolism without acute cor pulmonale: Secondary | ICD-10-CM | POA: Diagnosis not present

## 2024-07-17 DIAGNOSIS — J9601 Acute respiratory failure with hypoxia: Secondary | ICD-10-CM | POA: Diagnosis not present

## 2024-07-17 DIAGNOSIS — Z79899 Other long term (current) drug therapy: Secondary | ICD-10-CM | POA: Diagnosis not present

## 2024-07-22 DIAGNOSIS — D131 Benign neoplasm of stomach: Secondary | ICD-10-CM | POA: Diagnosis not present

## 2024-07-22 DIAGNOSIS — I10 Essential (primary) hypertension: Secondary | ICD-10-CM | POA: Diagnosis not present

## 2024-07-22 DIAGNOSIS — E538 Deficiency of other specified B group vitamins: Secondary | ICD-10-CM | POA: Diagnosis not present

## 2024-07-22 DIAGNOSIS — R54 Age-related physical debility: Secondary | ICD-10-CM | POA: Diagnosis not present

## 2024-07-22 DIAGNOSIS — Z7189 Other specified counseling: Secondary | ICD-10-CM | POA: Diagnosis not present

## 2024-07-22 DIAGNOSIS — I2699 Other pulmonary embolism without acute cor pulmonale: Secondary | ICD-10-CM | POA: Diagnosis not present

## 2024-07-22 DIAGNOSIS — D509 Iron deficiency anemia, unspecified: Secondary | ICD-10-CM | POA: Diagnosis not present

## 2024-07-22 DIAGNOSIS — Z515 Encounter for palliative care: Secondary | ICD-10-CM | POA: Diagnosis not present

## 2024-07-31 ENCOUNTER — Telehealth: Payer: Self-pay

## 2024-07-31 DIAGNOSIS — Z9181 History of falling: Secondary | ICD-10-CM | POA: Diagnosis not present

## 2024-07-31 DIAGNOSIS — I2699 Other pulmonary embolism without acute cor pulmonale: Secondary | ICD-10-CM | POA: Diagnosis not present

## 2024-07-31 DIAGNOSIS — I10 Essential (primary) hypertension: Secondary | ICD-10-CM | POA: Diagnosis not present

## 2024-07-31 DIAGNOSIS — R54 Age-related physical debility: Secondary | ICD-10-CM | POA: Diagnosis not present

## 2024-07-31 NOTE — Telephone Encounter (Signed)
 Copied from CRM #[id number]. Topic: Appointments - Scheduling Inquiry for Clinic >> Jul 31, 2024  2:11 PM Leila C wrote: Reason for CRM: Patient's child Alfreda [phone] needs to schedule appointment with Dr. Meade. Patient was in discharged 07/16/24-07/20/24 from the hospital in [place] . Patient needs oxygen eval, lov 04/09/23. Patient saw pcp at [facility] physician for hospital f/u 07/22/24. Next available appointment with Dr. Desai is 07/31/24 at 3:30 pm or 10/22/24 at 11 am.  Per CAL there's no early availability. Patient needs to be seen sooner, please advise.   ----------------------------------------------------------------------- From previous Reason for Contact - Scheduling: Patient/patient representative is calling to schedule an appointment. Refer to attachments for appointment information.   ATC pt's daughter X1. Was unable to lvm as it did not give me an option too. Pt would need a hospital f/u. Dr Meade does not have openings now until October.

## 2024-08-04 ENCOUNTER — Inpatient Hospital Stay (HOSPITAL_BASED_OUTPATIENT_CLINIC_OR_DEPARTMENT_OTHER)

## 2024-08-05 ENCOUNTER — Encounter (HOSPITAL_BASED_OUTPATIENT_CLINIC_OR_DEPARTMENT_OTHER): Payer: Self-pay

## 2024-08-05 ENCOUNTER — Ambulatory Visit (HOSPITAL_BASED_OUTPATIENT_CLINIC_OR_DEPARTMENT_OTHER)

## 2024-08-05 VITALS — BP 122/64 | HR 64 | Ht 62.0 in | Wt 99.0 lb

## 2024-08-05 DIAGNOSIS — I2699 Other pulmonary embolism without acute cor pulmonale: Secondary | ICD-10-CM

## 2024-08-05 DIAGNOSIS — Z87891 Personal history of nicotine dependence: Secondary | ICD-10-CM | POA: Diagnosis not present

## 2024-08-05 DIAGNOSIS — J432 Centrilobular emphysema: Secondary | ICD-10-CM | POA: Diagnosis not present

## 2024-08-05 NOTE — Assessment & Plan Note (Signed)
-   Continue Brovana , and Yupelri . -Continue albuterol  as needed.

## 2024-08-05 NOTE — Progress Notes (Signed)
 @Patient  ID: Amanda [name], female    DOB: [birth year], 89 y.o.   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  Briefly, the patient had the discovery of a gastric mass that required endoscopy.  Surgical pathology indicates it was a gastric adenoma with high grade dysplasia.  Due to issues with GI bleeding and the need for this procedure her anticoagulation was held.  Subsequently the patient traveled to [place]  to see her nephew graduate from the [employer].  While there, she became acutely short of breath and was admitted to the hospital.  She was found to have bilateral pulmonary embolisms and evidence of right heart strain on chest CT.  She was started on a heparin  drip and then transitioned back to Eliquis .  She had significant hypoxia while there, thus was discharged on oxygen via POC.  Today, she reports that her breathing is back to baseline.  She remains compliant with Brovana  and Yupelri .  She has Albuterol  that she can use as needed as well.  She denies chest pain, shortness of breath, cough, fever, chills, bloody stools, or pain.  She does complain of ongoing fatigue.  Her granddaughter is present and notes that she has had some trouble with low oxygen saturations at home.  She was discharged with a portable oxygen concentrator and has used it off and on.  She was initially placed on hospice care but this has been stopped.  Oxygen tanks were delivered to her home in association with a hospice care; these are in the process of being removed.  Today she is present for evaluation for the need for home oxygen concentrator.  TEST/EVENTS : CTA chest completed on 07/16/2024: 1.   Multilevel bilateral pulmonary emboli involving segmental and subsegmental branches.  Straightening of the interventricular septum and contrast reflux into the hepatic IVC suggestive of acute right ventricular heart strain.  Trace bilateral effusions and bilateral GGO's consistent with atelectasis versus infarct in the setting of pulmonary embolism.  Unable to rule out infectious versus inflammatory process.  Mild thickening of gastric mucosa.  Irregularly large calcified 2.6 x 2.5 cm structure between the left internal and external iliac branches.  Allergies  Allergen Reactions   Memantine  Other (See Comments)    Immunization History  Administered Date(s) Administered    sv, Bivalent, Protein Subunit Rsvpref,pf (Abrysvo) 03/03/2023   INFLUENZA, HIGH DOSE SEASONAL PF 09/03/2015, 08/22/2016, 10/16/2017, 11/26/2018, 08/01/2019, 09/10/2020, 08/24/2021, 09/15/2022, 08/17/2023   Influenza,inj,Quad PF,6+ Mos 01/06/2015   PFIZER(Purple Top)SARS-COV-2 Vaccination 12/17/2019, 01/07/2020, 09/13/2020   PNEUMOCOCCAL CONJUGATE-20 09/15/2022   Pneumococcal Conjugate-13 10/01/2015, 08/22/2016   Pneumococcal Polysaccharide-23 11/26/2018   Td 01/05/2023   Tdap 10/25/2011   Zoster, Live 12/03/2020, 02/24/2021    Past Medical History:  Diagnosis Date   Depression    DVT (deep venous thrombosis) (HCC)    Hyperlipidemia    Hypertension    Memory disorder    Osteoporosis    PVC (premature ventricular contraction)     Tobacco History: Social History   Tobacco Use  Smoking Status Former   Current packs/day: 0.00   Types: Cigarettes   Start date: 10/29/1954   Quit date: 10/30/1971   Years since quitting: 49.8  Smokeless Tobacco Never  Tobacco Comments   smoked 3-4/day   Counseling given: Not Answered Tobacco comments: smoked 3-4/day   Outpatient Medications Prior to Visit  Medication Sig Dispense Refill   acetaminophen  (TYLENOL ) 325 MG tablet Take 2 tablets (650 mg total) by mouth every 6  (six) hours as needed for mild pain, moderate pain, fever or headache (or Fever >/= 101).     albuterol  (PROVENTIL ) (2.5 MG/3ML) 0.083% nebulizer solution Take 3 mLs (2.5 mg total) by nebulization every 6 (six) hours as needed for wheezing or shortness of breath. 75 mL 5   amLODipine  (NORVASC ) 2.5 MG tablet 1 tablet Orally Once a day; Duration: 90 days     arformoterol  (BROVANA ) 15 MCG/2ML NEBU Take 2 mLs (15 mcg total) by nebulization 2 (two) times daily. 120 mL 6   cephALEXin (KEFLEX) 500 MG capsule Take 500 mg by mouth daily.     citalopram  (CELEXA ) 20 MG tablet Take 1 tablet (20 mg total) by mouth every morning. 30 tablet 0   cyanocobalamin  (VITAMIN B12) 1000 MCG tablet Take 1,000 mcg by mouth every morning.     Docusate Sodium (COLACE PO) Take by mouth.     donepezil  (ARICEPT ) 10 MG tablet Take 10 mg by mouth at bedtime.     famotidine (PEPCID) 20 MG tablet Take 20 mg by mouth daily.     ferrous sulfate  325 (65 FE) MG tablet Take 1 tablet (325 mg total) by mouth 2 (two) times daily with a meal. (Patient taking differently: Take 325 mg by mouth daily with breakfast.)     latanoprost  (XALATAN ) 0.005 % ophthalmic solution Place 1 drop into both eyes every evening.     metoprolol  succinate (TOPROL -XL) 25 MG 24 hr tablet Take 0.5 tablets (12.5 mg total) by mouth daily. 15 tablet 0   Multiple Vitamins-Minerals (CENTRUM SILVER PO) Take 1 tablet by mouth every morning.     pantoprazole  (PROTONIX ) 40 MG tablet Take 1 tablet (40 mg total) by mouth 2 (two) times daily. 60 tablet 0   revefenacin  (YUPELRI ) 175 MCG/3ML nebulizer solution Take 3 mLs (175 mcg total) by nebulization daily. 90 mL 5   No facility-administered medications prior to visit.     Review of Systems: as per HPI  Constitutional:   No  weight loss, night sweats,  Fevers, chills,or  lassitude.  Positive for fatigue.  HEENT:   No headaches,  Difficulty swallowing,  Tooth/dental problems, or  Sore throat,                No sneezing,  itching, ear ache, nasal congestion, post nasal drip,   CV:  No chest pain,  Orthopnea, PND, swelling in lower extremities, anasarca, dizziness, palpitations, syncope.   GI  No heartburn, indigestion, abdominal pain, nausea, vomiting, diarrhea, change in bowel habits, loss of appetite, bloody stools.   Resp: No shortness of breath with exertion or at rest.  No excess mucus, no productive cough,  No non-productive cough,  No coughing up of blood.  No change in color of mucus.  No wheezing.  No chest wall deformity  Skin: no rash or lesions.  GU: no dysuria, change in color of urine, no urgency or frequency.  No flank pain, no hematuria   MS:  No joint pain or swelling.  No decreased range of motion.  No back pain.    Physical Exam  BP 122/64   Pulse 64   Ht 5' 2 (1.575 m)   Wt 99 lb (44.9  kg)   SpO2 90%   BMI 18.11 kg/m  After a walk her oxygen levels went up to 95%.  GEN: A/Ox3; pleasant , NAD, thin, frail.  Oriented to self and place.   HEENT:  [place]/AT,  EACs-clear, TMs-wnl, NOSE-clear, THROAT-clear, no lesions, no postnasal drip or exudate noted.   NECK:  Supple w/ fair ROM; no JVD; normal carotid impulses w/o bruits; no thyromegaly or nodules palpated; no lymphadenopathy.    RESP  Clear  P & A; w/o, wheezes/ rales/ or rhonchi. no accessory muscle use, no dullness to percussion  CARD:  RRR, no m/r/g, no peripheral edema, pulses intact, no cyanosis or clubbing.  GI:   Soft & nt; nml bowel sounds; no organomegaly or masses detected.   Musco: Warm bil, no deformities or joint swelling noted.   Neuro: alert, no focal deficits noted.    Skin: Warm, no lesions or rashes    Lab Results:  CBC    Component Value Date/Time   WBC 6.0 06/17/2024 1005   WBC 8.3 05/01/2024 0454   RBC 4.51 06/17/2024 1005   HGB 12.9 06/17/2024 1005   HCT 41.2 06/17/2024 1005   PLT 244 06/17/2024 1005   MCV 91.4 06/17/2024 1005   MCH 28.6 06/17/2024 1005   MCHC 31.3 06/17/2024 1005   RDW  16.7 (H) 06/17/2024 1005   LYMPHSABS 1.1 06/17/2024 1005   MONOABS 0.6 06/17/2024 1005   EOSABS 0.1 06/17/2024 1005   BASOSABS 0.1 06/17/2024 1005    BMET    Component Value Date/Time   NA 141 05/13/2024 1315   K 4.0 05/13/2024 1315   CL 104 05/13/2024 1315   CO2 26 05/13/2024 1315   GLUCOSE 90 05/13/2024 1315   BUN 15 05/13/2024 1315   CREATININE 0.77 05/13/2024 1315   CALCIUM 9.3 05/13/2024 1315   GFRNONAA >60 05/13/2024 1315   GFRAA >60 05/04/2020 1237    BNP    Component Value Date/Time   BNP 47.4 12/12/2019 1410    ProBNP No results found for: PROBNP  Imaging: No results found.  Administration History     None          Latest Ref Rng & Units 12/17/2020    9:59 AM  PFT Results  FVC-Pre L 1.40   FVC-Predicted Pre % 93   FVC-Post L 1.43   FVC-Predicted Post % 95   Pre FEV1/FVC % % 84   Post FEV1/FCV % % 83   FEV1-Pre L 1.18   FEV1-Predicted Pre % 102   FEV1-Post L 1.19   DLCO uncorrected ml/min/mmHg 10.99   DLCO UNC% % 65   DLCO corrected ml/min/mmHg 10.99   DLCO COR %Predicted % 65   DLVA Predicted % 105   TLC L 3.50   TLC % Predicted % 75   RV % Predicted % 80     No results found for: NITRICOXIDE   Assessment & Plan:   Assessment & Plan Pulmonary embolism, unspecified chronicity, unspecified pulmonary embolism type, unspecified whether acute cor pulmonale present (HCC) - Unfortunately she did not qualify for home oxygen per the walk completed at today's visit. -Plan for close follow-up in 4 months; she may need follow-up echo given evidence of possible right heart strain noted on CT at the time of discovery of her pulmonary embolisms. Centrilobular emphysema (HCC) - Continue Brovana , and Yupelri . -Continue albuterol  as needed.   Return in about 4 months (around 12/05/2024).  Candis Dandy, PA-C 08/05/2024

## 2024-08-05 NOTE — Patient Instructions (Addendum)
 Continue supplemental oxygen as needed.   Continue Brovana  and Yupelri .  Continue Albuterol  as needed.  Continue Eliquis  for PE/DVT.  Follow up in 4 months; RTC sooner if new or worsening complaints.

## 2024-08-06 NOTE — Telephone Encounter (Signed)
 Seen by candis yesterday for hospital follow up.NFN

## 2024-09-12 ENCOUNTER — Ambulatory Visit: Admitting: Podiatry

## 2024-09-12 ENCOUNTER — Telehealth: Payer: Self-pay | Admitting: Oncology

## 2024-09-12 ENCOUNTER — Encounter: Payer: Self-pay | Admitting: Podiatry

## 2024-09-12 DIAGNOSIS — M79672 Pain in left foot: Secondary | ICD-10-CM | POA: Diagnosis not present

## 2024-09-12 DIAGNOSIS — B351 Tinea unguium: Secondary | ICD-10-CM | POA: Diagnosis not present

## 2024-09-12 DIAGNOSIS — M79671 Pain in right foot: Secondary | ICD-10-CM | POA: Diagnosis not present

## 2024-09-12 NOTE — Telephone Encounter (Signed)
 Pts daughter called to get her appt changed to an earlier date.

## 2024-09-12 NOTE — Progress Notes (Signed)
 Patient presents for evaluation and treatment of tenderness and some redness around nails feet.  Tenderness around toes with walking and wearing shoes.  Physical exam:  General appearance: Alert, pleasant, and in no acute distress.  Vascular: Pedal pulses: DP 1/4 B/L, PT 1/4 B/L.  Moderate edema lower legs bilaterally  Neurologic:  Dermatologic:  Nails thickened, disfigured, discolored 1-5 BL with subungual debris.  Redness and hypertrophic nail folds along nail folds bilaterally but no signs of drainage or infection.  Musculoskeletal:     Diagnosis: 1. Painful onychomycotic nails 1 through 5 bilaterally. 2. Pain toes 1 through 5 bilaterally.  Plan: -Debrided onychomycotic nails 1 through 5 bilaterally.  Sharply debrided nails with nail clipper and reduced with a power bur.  Return 3 months [facility]

## 2024-09-15 ENCOUNTER — Inpatient Hospital Stay: Admitting: Oncology

## 2024-09-15 ENCOUNTER — Inpatient Hospital Stay: Attending: Oncology

## 2024-09-15 VITALS — BP 132/87 | HR 72 | Temp 97.6°F | Resp 18 | Ht 62.0 in | Wt 101.2 lb

## 2024-09-15 DIAGNOSIS — Z862 Personal history of diseases of the blood and blood-forming organs and certain disorders involving the immune mechanism: Secondary | ICD-10-CM | POA: Diagnosis not present

## 2024-09-15 DIAGNOSIS — I82409 Acute embolism and thrombosis of unspecified deep veins of unspecified lower extremity: Secondary | ICD-10-CM | POA: Diagnosis not present

## 2024-09-15 DIAGNOSIS — D649 Anemia, unspecified: Secondary | ICD-10-CM | POA: Diagnosis not present

## 2024-09-15 DIAGNOSIS — Z8719 Personal history of other diseases of the digestive system: Secondary | ICD-10-CM | POA: Insufficient documentation

## 2024-09-15 DIAGNOSIS — Z86711 Personal history of pulmonary embolism: Secondary | ICD-10-CM | POA: Insufficient documentation

## 2024-09-15 DIAGNOSIS — I2699 Other pulmonary embolism without acute cor pulmonale: Secondary | ICD-10-CM | POA: Insufficient documentation

## 2024-09-15 DIAGNOSIS — Z7901 Long term (current) use of anticoagulants: Secondary | ICD-10-CM | POA: Diagnosis not present

## 2024-09-15 DIAGNOSIS — Z86718 Personal history of other venous thrombosis and embolism: Secondary | ICD-10-CM | POA: Diagnosis not present

## 2024-09-15 LAB — CBC WITH DIFFERENTIAL (CANCER CENTER ONLY)
Abs Immature Granulocytes: 0.02 K/uL (ref 0.00–0.07)
Basophils Absolute: 0.1 K/uL (ref 0.0–0.1)
Basophils Relative: 1 %
Eosinophils Absolute: 0.2 K/uL (ref 0.0–0.5)
Eosinophils Relative: 4 %
HCT: 45.4 % (ref 36.0–46.0)
Hemoglobin: 14.7 g/dL (ref 12.0–15.0)
Immature Granulocytes: 1 %
Lymphocytes Relative: 29 %
Lymphs Abs: 1.1 K/uL (ref 0.7–4.0)
MCH: 29.8 pg (ref 26.0–34.0)
MCHC: 32.4 g/dL (ref 30.0–36.0)
MCV: 91.9 fL (ref 80.0–100.0)
Monocytes Absolute: 0.4 K/uL (ref 0.1–1.0)
Monocytes Relative: 12 %
Neutro Abs: 2 K/uL (ref 1.7–7.7)
Neutrophils Relative %: 53 %
Platelet Count: 224 K/uL (ref 150–400)
RBC: 4.94 MIL/uL (ref 3.87–5.11)
RDW: 16.2 % — ABNORMAL HIGH (ref 11.5–15.5)
WBC Count: 3.7 K/uL — ABNORMAL LOW (ref 4.0–10.5)
nRBC: 0 % (ref 0.0–0.2)

## 2024-09-15 LAB — FERRITIN: Ferritin: 61 ng/mL (ref 11–307)

## 2024-09-15 LAB — SAMPLE TO BLOOD BANK

## 2024-09-15 NOTE — Progress Notes (Signed)
 [facility] OFFICE PROGRESS NOTE   Diagnosis: Gastric adenoma, iron deficiency anemia, pulmonary embolism  INTERVAL HISTORY:   Amanda [name] returns for scheduled visit.  She underwent endoscopic resection of the gastric mass at [facility] on 07/02/2024.  A 50 mm semipedunculated polyp was noted in the prepyloric region of the stomach.  The lesion was resected endoscopically.  She was scheduled to resume apixaban  4 weeks following the procedure.  The pathology from the endoscopic mucosal resection revealed gastric adenoma, intestinal type, with high-grade dysplasia in the background of an inflammatory fibroid polyp.  The cauterized tissue edges are positive for adenoma/low-grade dysplasia.  Amanda [name] is here today with her granddaughter.  The granddaughter reports Amanda [name] was diagnosed with a DVT and pulmonary embolism while attending her grandsons graduation in [place]  in late August.  She was not on apixaban  at the time.  She has resumed apixaban .  Amanda [name] has no specific complaint.  She is losing weight.  No bleeding.  Objective:  Vital signs in last 24 hours:  Blood pressure 132/87, pulse 72, temperature 97.6 F (36.4 C), temperature source Temporal, resp. rate 18, height 5' 2 (1.575 m), weight 101 lb 3.2 oz (45.9 kg), SpO2 98%.  Lymphatics: No cervical, supraclavicular, axillary, or inguinal nodes Resp: Lungs clear bilaterally Cardio: Regular rate and rhythm GI: No hepatosplenomegaly Vascular: No leg edema Neurologic: Not oriented to place, year, or month.  She could not name her granddaughter.   Lab Results:  Lab Results  Component Value Date   WBC 3.7 (L) 09/15/2024   HGB 14.7 09/15/2024   HCT 45.4 09/15/2024   MCV 91.9 09/15/2024   PLT 224 09/15/2024   NEUTROABS 2.0 09/15/2024    CMP  Lab Results  Component Value Date   NA 141 05/13/2024   K 4.0 05/13/2024   CL 104 05/13/2024   CO2 26 05/13/2024   GLUCOSE 90 05/13/2024   BUN 15 05/13/2024    CREATININE 0.77 05/13/2024   CALCIUM 9.3 05/13/2024   PROT 6.7 05/13/2024   ALBUMIN 3.8 05/13/2024   AST 25 05/13/2024   ALT 12 05/13/2024   ALKPHOS 53 05/13/2024   BILITOT 0.3 05/13/2024   GFRNONAA >60 05/13/2024   GFRAA >60 05/04/2020    Lab Results  Component Value Date   CEA1 1.1 04/28/2024   RJW800 9 04/28/2024    Medications: I have reviewed the patient's current medications.   Assessment/Plan: Pyloric/proximal duodenal mass EGD 04/28/2024: Villous mass at the pylorus and duodenal bulb-adenoma CT chest/abdomen/pelvis 04/28/2024: Emphysema, mild T8 compression fracture, left adrenal mass stable dating to November 2021, consistent with an adenoma, pyloric/duodenal mass measuring approximately 3.1 cm 05/01/2024: Repeat EGD-pyloric channel mass: Duodenal intestinal type adenoma with focal high-grade dysplasia, mismatch repair protein expression intact 07/02/2024: Endoscopic mucosal resection of gastric antrum polyp at [facility]: Gastric adenoma, intestinal type with high-grade dysplasia in a background of an inflammatory fibroid polyp, cauterized tissue edges positive for adenoma/low-grade dysplasia 2.  History of iron deficiency anemia secondary to #1, status post 2 units packed red blood cells 04/26/2024, 1 unit packed red cells 04/30/2024 3.   Mild dementia 4.   Hypertension 5.   History of DVT/pulmonary embolism, maintained on rivaroxaban  prior to hospital admission Pulmonary embolism 12/02/2022, indefinite anticoagulation recommended Left leg DVT November 2021, Doppler 11/24/2019-[facility], thrombus in a calf vein tributary of the popliteal, deep venous system patent, Xarelto  (family reports approximately 6 weeks of anticoagulation therapy) 6.   History of depression 7.  COPD 8.   Hyperlipidemia 9.   Left arm phlebitis associated with an IV June 2025        Disposition: Amanda [name] underwent endoscopic mucosal resection of the gastric mass 07/02/2024.  The pathology confirmed an  adenocarcinoma with high-grade dysplasia.  The specimen was received piecemeal and cauterized margins were positive for adenoma.  Amanda [name] has a history of iron deficiency anemia secondary to bleeding from #1 while on anticoagulation therapy.  The anemia has resolved.  She was diagnosed with a recurrent pulmonary embolism in August.  We will obtain the discharge summary from her primary provider.  She has resumed apixaban  anticoagulation.  She should continue indefinite anticoagulation therapy.  Amanda [name] appears to have significant dementia.  She lives alone.  Her granddaughter reports family members are aware and are planning to make arrangements for a family member to live with Amanda [name] when the dementia progresses.  Arley Hof, MD  09/15/2024  9:55 AM

## 2024-10-15 ENCOUNTER — Ambulatory Visit: Admitting: Podiatry

## 2024-11-26 ENCOUNTER — Emergency Department (HOSPITAL_COMMUNITY)

## 2024-11-26 ENCOUNTER — Encounter (HOSPITAL_COMMUNITY): Payer: Self-pay

## 2024-11-26 ENCOUNTER — Inpatient Hospital Stay (HOSPITAL_COMMUNITY)
Admission: EM | Admit: 2024-11-26 | Discharge: 2024-11-28 | DRG: 193 | Disposition: A | Discharge status: Home Health | Attending: Internal Medicine | Admitting: Internal Medicine

## 2024-11-26 DIAGNOSIS — Z79899 Other long term (current) drug therapy: Secondary | ICD-10-CM | POA: Diagnosis not present

## 2024-11-26 DIAGNOSIS — K219 Gastro-esophageal reflux disease without esophagitis: Secondary | ICD-10-CM | POA: Diagnosis present

## 2024-11-26 DIAGNOSIS — Z7901 Long term (current) use of anticoagulants: Secondary | ICD-10-CM

## 2024-11-26 DIAGNOSIS — J9601 Acute respiratory failure with hypoxia: Secondary | ICD-10-CM | POA: Diagnosis present

## 2024-11-26 DIAGNOSIS — Z66 Do not resuscitate: Secondary | ICD-10-CM | POA: Diagnosis present

## 2024-11-26 DIAGNOSIS — I5032 Chronic diastolic (congestive) heart failure: Secondary | ICD-10-CM | POA: Diagnosis present

## 2024-11-26 DIAGNOSIS — I110 Hypertensive heart disease with heart failure: Secondary | ICD-10-CM | POA: Diagnosis present

## 2024-11-26 DIAGNOSIS — I739 Peripheral vascular disease, unspecified: Secondary | ICD-10-CM | POA: Diagnosis present

## 2024-11-26 DIAGNOSIS — Z87891 Personal history of nicotine dependence: Secondary | ICD-10-CM

## 2024-11-26 DIAGNOSIS — Z1152 Encounter for screening for COVID-19: Secondary | ICD-10-CM

## 2024-11-26 DIAGNOSIS — Z888 Allergy status to other drugs, medicaments and biological substances status: Secondary | ICD-10-CM | POA: Diagnosis not present

## 2024-11-26 DIAGNOSIS — Z820 Family history of epilepsy and other diseases of the nervous system: Secondary | ICD-10-CM

## 2024-11-26 DIAGNOSIS — Z86711 Personal history of pulmonary embolism: Secondary | ICD-10-CM | POA: Diagnosis not present

## 2024-11-26 DIAGNOSIS — J432 Centrilobular emphysema: Secondary | ICD-10-CM

## 2024-11-26 DIAGNOSIS — E785 Hyperlipidemia, unspecified: Secondary | ICD-10-CM | POA: Diagnosis present

## 2024-11-26 DIAGNOSIS — M810 Age-related osteoporosis without current pathological fracture: Secondary | ICD-10-CM | POA: Diagnosis present

## 2024-11-26 DIAGNOSIS — K317 Polyp of stomach and duodenum: Secondary | ICD-10-CM | POA: Diagnosis present

## 2024-11-26 DIAGNOSIS — Z8719 Personal history of other diseases of the digestive system: Secondary | ICD-10-CM

## 2024-11-26 DIAGNOSIS — J449 Chronic obstructive pulmonary disease, unspecified: Secondary | ICD-10-CM | POA: Diagnosis present

## 2024-11-26 DIAGNOSIS — F03A0 Unspecified dementia, mild, without behavioral disturbance, psychotic disturbance, mood disturbance, and anxiety: Secondary | ICD-10-CM | POA: Diagnosis present

## 2024-11-26 DIAGNOSIS — F0390 Unspecified dementia without behavioral disturbance: Secondary | ICD-10-CM | POA: Diagnosis present

## 2024-11-26 DIAGNOSIS — R531 Weakness: Secondary | ICD-10-CM | POA: Diagnosis present

## 2024-11-26 DIAGNOSIS — R296 Repeated falls: Secondary | ICD-10-CM | POA: Diagnosis present

## 2024-11-26 DIAGNOSIS — Z90711 Acquired absence of uterus with remaining cervical stump: Secondary | ICD-10-CM | POA: Diagnosis not present

## 2024-11-26 DIAGNOSIS — Z86718 Personal history of other venous thrombosis and embolism: Secondary | ICD-10-CM

## 2024-11-26 DIAGNOSIS — J101 Influenza due to other identified influenza virus with other respiratory manifestations: Principal | ICD-10-CM | POA: Diagnosis present

## 2024-11-26 LAB — URINALYSIS, ROUTINE W REFLEX MICROSCOPIC
Bilirubin Urine: NEGATIVE
Glucose, UA: NEGATIVE mg/dL
Hgb urine dipstick: NEGATIVE
Ketones, ur: 20 mg/dL — AB
Leukocytes,Ua: NEGATIVE
Nitrite: NEGATIVE
Protein, ur: NEGATIVE mg/dL
Specific Gravity, Urine: 1.023 (ref 1.005–1.030)
pH: 5 (ref 5.0–8.0)

## 2024-11-26 LAB — COMPREHENSIVE METABOLIC PANEL WITH GFR
ALT: 15 U/L (ref 0–44)
AST: 27 U/L (ref 15–41)
Albumin: 3.8 g/dL (ref 3.5–5.0)
Alkaline Phosphatase: 48 U/L (ref 38–126)
Anion gap: 12 (ref 5–15)
BUN: 12 mg/dL (ref 8–23)
CO2: 26 mmol/L (ref 22–32)
Calcium: 8.9 mg/dL (ref 8.9–10.3)
Chloride: 99 mmol/L (ref 98–111)
Creatinine, Ser: 0.73 mg/dL (ref 0.44–1.00)
GFR, Estimated: >60 mL/min
Glucose, Bld: 98 mg/dL (ref 70–99)
Potassium: 4.1 mmol/L (ref 3.5–5.1)
Sodium: 137 mmol/L (ref 135–145)
Total Bilirubin: 0.4 mg/dL (ref 0.0–1.2)
Total Protein: 7 g/dL (ref 6.5–8.1)

## 2024-11-26 LAB — I-STAT CHEM 8, ED
BUN: 13 mg/dL (ref 8–23)
Calcium, Ion: 1.05 mmol/L — ABNORMAL LOW (ref 1.15–1.40)
Chloride: 99 mmol/L (ref 98–111)
Creatinine, Ser: 0.8 mg/dL (ref 0.44–1.00)
Glucose, Bld: 103 mg/dL — ABNORMAL HIGH (ref 70–99)
HCT: 42 % (ref 36.0–46.0)
Hemoglobin: 14.3 g/dL (ref 12.0–15.0)
Potassium: 3.7 mmol/L (ref 3.5–5.1)
Sodium: 137 mmol/L (ref 135–145)
TCO2: 26 mmol/L (ref 22–32)

## 2024-11-26 LAB — RESP PANEL BY RT-PCR (RSV, FLU A&B, COVID)  RVPGX2
Influenza A by PCR: POSITIVE — AB
Influenza B by PCR: NEGATIVE
Resp Syncytial Virus by PCR: NEGATIVE
SARS Coronavirus 2 by RT PCR: NEGATIVE

## 2024-11-26 LAB — PRO BRAIN NATRIURETIC PEPTIDE: Pro Brain Natriuretic Peptide: 1884 pg/mL — ABNORMAL HIGH

## 2024-11-26 LAB — CBC
HCT: 46.5 % — ABNORMAL HIGH (ref 36.0–46.0)
Hemoglobin: 15.2 g/dL — ABNORMAL HIGH (ref 12.0–15.0)
MCH: 30.9 pg (ref 26.0–34.0)
MCHC: 32.7 g/dL (ref 30.0–36.0)
MCV: 94.5 fL (ref 80.0–100.0)
Platelets: 228 K/uL (ref 150–400)
RBC: 4.92 MIL/uL (ref 3.87–5.11)
RDW: 14.1 % (ref 11.5–15.5)
WBC: 8.2 K/uL (ref 4.0–10.5)
nRBC: 0 % (ref 0.0–0.2)

## 2024-11-26 LAB — I-STAT VENOUS BLOOD GAS, ED
Acid-Base Excess: 5 mmol/L — ABNORMAL HIGH (ref 0.0–2.0)
Bicarbonate: 30.5 mmol/L — ABNORMAL HIGH (ref 20.0–28.0)
Calcium, Ion: 1.03 mmol/L — ABNORMAL LOW (ref 1.15–1.40)
HCT: 45 % (ref 36.0–46.0)
Hemoglobin: 15.3 g/dL — ABNORMAL HIGH (ref 12.0–15.0)
O2 Saturation: 91 %
Potassium: 4.9 mmol/L (ref 3.5–5.1)
Sodium: 139 mmol/L (ref 135–145)
TCO2: 32 mmol/L (ref 22–32)
pCO2, Ven: 47.4 mmHg (ref 44–60)
pH, Ven: 7.416 (ref 7.25–7.43)
pO2, Ven: 62 mmHg — ABNORMAL HIGH (ref 32–45)

## 2024-11-26 LAB — PROCALCITONIN: Procalcitonin: 0.24 ng/mL

## 2024-11-26 MED ORDER — OSELTAMIVIR PHOSPHATE 75 MG PO CAPS
75.0000 mg | ORAL_CAPSULE | Freq: Two times a day (BID) | ORAL | Status: DC
  Administered 2024-11-26 – 2024-11-27 (×2): 75 mg via ORAL
  Filled 2024-11-26 (×3): qty 1

## 2024-11-26 MED ORDER — ACETAMINOPHEN 650 MG RE SUPP: 650.0000 mg | Freq: Four times a day (QID) | RECTAL | Status: DC | PRN

## 2024-11-26 MED ORDER — ALBUTEROL SULFATE (2.5 MG/3ML) 0.083% IN NEBU: 2.5000 mg | INHALATION_SOLUTION | RESPIRATORY_TRACT | Status: DC | PRN

## 2024-11-26 MED ORDER — CITALOPRAM HYDROBROMIDE 20 MG PO TABS
20.0000 mg | ORAL_TABLET | Freq: Every morning | ORAL | Status: DC
  Administered 2024-11-27 – 2024-11-28 (×2): 20 mg via ORAL
  Filled 2024-11-26 (×2): qty 1

## 2024-11-26 MED ORDER — DONEPEZIL HCL 10 MG PO TABS
10.0000 mg | ORAL_TABLET | Freq: Every day | ORAL | Status: DC
  Administered 2024-11-26 – 2024-11-27 (×2): 10 mg via ORAL
  Filled 2024-11-26 (×2): qty 1

## 2024-11-26 MED ORDER — PANTOPRAZOLE SODIUM 40 MG PO TBEC
40.0000 mg | DELAYED_RELEASE_TABLET | Freq: Two times a day (BID) | ORAL | Status: DC
  Administered 2024-11-26 – 2024-11-28 (×4): 40 mg via ORAL
  Filled 2024-11-26: qty 2
  Filled 2024-11-26 (×2): qty 1
  Filled 2024-11-26: qty 2

## 2024-11-26 MED ORDER — ACETAMINOPHEN 325 MG PO TABS
650.0000 mg | ORAL_TABLET | Freq: Four times a day (QID) | ORAL | Status: DC | PRN
  Administered 2024-11-26 – 2024-11-27 (×3): 650 mg via ORAL
  Filled 2024-11-26 (×3): qty 2

## 2024-11-26 MED ORDER — SODIUM CHLORIDE 0.9% FLUSH
3.0000 mL | Freq: Two times a day (BID) | INTRAVENOUS | Status: DC
  Administered 2024-11-27 – 2024-11-28 (×3): 3 mL via INTRAVENOUS

## 2024-11-26 MED ORDER — ACETAMINOPHEN 325 MG PO TABS
325.0000 mg | ORAL_TABLET | Freq: Once | ORAL | Status: AC
  Administered 2024-11-26: 325 mg via ORAL
  Filled 2024-11-26: qty 1

## 2024-11-26 MED ORDER — REVEFENACIN 175 MCG/3ML IN SOLN
175.0000 ug | Freq: Every day | RESPIRATORY_TRACT | Status: DC
  Administered 2024-11-26 – 2024-11-28 (×3): 175 ug via RESPIRATORY_TRACT
  Filled 2024-11-26 (×3): qty 3

## 2024-11-26 MED ORDER — ONDANSETRON HCL 4 MG PO TABS: 4.0000 mg | ORAL_TABLET | Freq: Four times a day (QID) | ORAL | Status: DC | PRN

## 2024-11-26 MED ORDER — ALBUTEROL SULFATE (2.5 MG/3ML) 0.083% IN NEBU: 2.5000 mg | INHALATION_SOLUTION | Freq: Four times a day (QID) | RESPIRATORY_TRACT | Status: DC | PRN

## 2024-11-26 MED ORDER — ONDANSETRON HCL 4 MG/2ML IJ SOLN: 4.0000 mg | Freq: Four times a day (QID) | INTRAMUSCULAR | Status: DC | PRN

## 2024-11-26 MED ORDER — APIXABAN 2.5 MG PO TABS
2.5000 mg | ORAL_TABLET | Freq: Two times a day (BID) | ORAL | Status: DC
  Administered 2024-11-26 – 2024-11-28 (×4): 2.5 mg via ORAL
  Filled 2024-11-26 (×4): qty 1

## 2024-11-26 MED ORDER — ARFORMOTEROL TARTRATE 15 MCG/2ML IN NEBU
15.0000 ug | INHALATION_SOLUTION | Freq: Two times a day (BID) | RESPIRATORY_TRACT | Status: DC
  Administered 2024-11-26 – 2024-11-28 (×4): 15 ug via RESPIRATORY_TRACT
  Filled 2024-11-26 (×5): qty 2

## 2024-11-26 NOTE — ED Provider Triage Note (Signed)
 Emergency Medicine Provider Triage Evaluation Note  Amanda [name] , a 89 y.o. female  was evaluated in triage.  Pt complains of fall. Per granddaughter, pt slid off the couch and landed on her buttock.  Denies head injury.  Is on eliquis , ems found pt to be hypoxic with O2 sat 85% on RA, placed on 2L  Review of Systems  Positive: As above Negative: As above  Physical Exam  BP (!) 197/90 (BP Location: Left Arm)   Pulse 68   Temp 100.1 F (37.8 C) (Oral)   Resp 14   SpO2 95%  Gen:   Awake, no distress   Resp:  Normal effort  MSK:   Moves extremities without difficulty  Other:  On supplemental O2  Medical Decision Making  Medically screening exam initiated at 11:15 AM.  Appropriate orders placed.  Amanda [name] was informed that the remainder of the evaluation will be completed by another provider, this initial triage assessment does not replace that evaluation, and the importance of remaining in the ED until their evaluation is complete.     Nivia Colon, PA-C 11/26/24 1117

## 2024-11-26 NOTE — ED Triage Notes (Signed)
 Pt BIB [facility] from home, granddaughter called for concern for fall. Pt slid off the couch and landed on her back side. Pt on eliquis  but denies hitting head. Increased weakness contributing to fall, pt reports painful urination. 85% on room air initially, put on 2 L and now 100%  181/92 HR 90 CBG 115

## 2024-11-26 NOTE — H&P (Signed)
 " History and Physical    Patient: Amanda [name] FMW:[id number] DOB: 03/25/1933 DOA: 11/26/2024 DOS: the patient was seen and examined on 11/26/2024 PCP: Rolinda Millman, MD  Patient coming from: Home  Chief Complaint: No chief complaint on file.  HPI: Amanda [name] is a 89 y.o. female with medical history significant of hypertension, hyperlipidemia, pulmonary embolism /DVT, gastric mass s/p resection and dementia pulmonary embolism, presents with chills and dizziness following two falls.  History is obtained with assistance of patient's granddaughter over the phone.  She experiences chills without any associated  nausea or vomiting. She describes having 'good days and bad days,' with today being a bad day.    She experienced two falls recently. The first fall occurred in the driveway while retrieving the newspaper, and she was able to get up and return inside. The second fall happened inside the house on carpet, where she told family she felt dizzy and fell, landing on her bottom. She was found sitting on the floor, leaning against the couch, and does not recall how she fell. She mentioned her heart was hurting and thought she might have had a stroke.  She has a history of pulmonary embolism, for which she was hospitalized in August 2025. She was previously on oxygen but was taken off after follow-up with pulmonology. She is on Eliquis .  She has a history of a gastric mass that was initially thought to be too large for removal. However, a subsequent scope revealed the mass to be smaller, and it was removed. The mass was benign upon biopsy.  She typically walks without assistive devices but experiences shortness of breath with exertion. She has a walker that she is encouraged to use but does not always do so. She lives alone at home and family comes over to check on her.  Upon admission into the emergency department patient was noted to be febrile up to 102.2 F with tachypnea,  blood pressures elevated up to 197/90, and all other vital signs maintained.  Labs Noted WBC 8.2, hemoglobin 15.2, influenza A screening positive.  CT scan of the head did not reveal any acute abnormality.  Chest x-ray noted chronic cardiomegaly and lung scarring without acute abnormality.  No acute abnormality noted of the lumbar spine.  Patient had been given acetaminophen  325 mg p.o. [facility] called to admit.  Review of Systems: As mentioned in the history of present illness. All other systems reviewed and are negative. Past Medical History:  Diagnosis Date   Depression    DVT (deep venous thrombosis) (HCC)    Hyperlipidemia    Hypertension    Memory disorder    Osteoporosis    PVC (premature ventricular contraction)    Past Surgical History:  Procedure Laterality Date   BIOPSY OF SKIN SUBCUTANEOUS TISSUE AND/OR MUCOUS MEMBRANE  04/28/2024   Procedure: BIOPSY, SKIN, SUBCUTANEOUS TISSUE, OR MUCOUS MEMBRANE;  Surgeon: Saintclair Jasper, MD;  Location: [facility] ENDOSCOPY;  Service: Gastroenterology;;   ESOPHAGOGASTRODUODENOSCOPY Left 04/28/2024   Procedure: EGD (ESOPHAGOGASTRODUODENOSCOPY);  Surgeon: Saintclair Jasper, MD;  Location: [facility] ENDOSCOPY;  Service: Gastroenterology;  Laterality: Left;   ESOPHAGOGASTRODUODENOSCOPY N/A 05/01/2024   Procedure: EGD (ESOPHAGOGASTRODUODENOSCOPY);  Surgeon: Saintclair Jasper, MD;  Location: [facility] ENDOSCOPY;  Service: Gastroenterology;  Laterality: N/A;   PARTIAL HYSTERECTOMY     Social History:  reports that she quit smoking about 53 years ago. Her smoking use included cigarettes. She started smoking about 70 years ago. She has never used smokeless tobacco. She reports that she does not  drink alcohol and does not use drugs.  Allergies[1]  Family History  Problem Relation Age of Onset   Alzheimer's disease Mother    Lung cancer Neg Hx    Memory loss Neg Hx     Prior to Admission medications  Medication Sig Start Date End Date Taking? Authorizing Provider  acetaminophen  (TYLENOL )  325 MG tablet Take 2 tablets (650 mg total) by mouth every 6 (six) hours as needed for mild pain, moderate pain, fever or headache (or Fever >/= 101). Patient not taking: Reported on 09/15/2024 12/02/22   Tobie Yetta HERO, MD  amLODipine  (NORVASC ) 5 MG tablet Take 5 mg by mouth daily.    [provider]  arformoterol  (BROVANA ) 15 MCG/2ML NEBU Take 2 mLs (15 mcg total) by nebulization 2 (two) times daily. 04/02/23   Meade Verdon RAMAN, MD  citalopram  (CELEXA ) 20 MG tablet Take 1 tablet (20 mg total) by mouth every morning. 12/02/22   Tobie Yetta HERO, MD  cyanocobalamin  (VITAMIN B12) 1000 MCG tablet Take 1,000 mcg by mouth every morning.    [provider]  Docusate Sodium (COLACE PO) Take by mouth. Patient taking differently: Take 1 capsule by mouth every other day.    [provider]  donepezil  (ARICEPT ) 10 MG tablet Take 10 mg by mouth at bedtime.    [provider]  ELIQUIS  2.5 MG TABS tablet Take 2.5 mg by mouth 2 (two) times daily. 08/26/24   [provider]  famotidine (PEPCID) 20 MG tablet Take 20 mg by mouth daily. 09/17/23   [provider]  ferrous sulfate  325 (65 FE) MG tablet Take 1 tablet (325 mg total) by mouth 2 (two) times daily with a meal. Patient taking differently: Take 325 mg by mouth daily with breakfast. 05/01/24   Cindy Garnette POUR, MD  latanoprost  (XALATAN ) 0.005 % ophthalmic solution Place 1 drop into both eyes every evening.    [provider]  metoprolol  succinate (TOPROL -XL) 25 MG 24 hr tablet Take 0.5 tablets (12.5 mg total) by mouth daily. 05/02/24 09/15/24  Cindy Garnette POUR, MD  Multiple Vitamins-Minerals (CENTRUM ADULT PO) Take 1 tablet by mouth daily at 6 (six) AM.    [provider]  pantoprazole  (PROTONIX ) 40 MG tablet Take 1 tablet (40 mg total) by mouth 2 (two) times daily. 12/02/22   Tobie Yetta HERO, MD  revefenacin  (YUPELRI ) 175 MCG/3ML nebulizer solution Take 3 mLs (175 mcg total) by nebulization daily.  04/02/23   Meade Verdon RAMAN, MD    Physical Exam: Vitals:   11/26/24 1050 11/26/24 1315 11/26/24 1500 11/26/24 1509  BP: (!) 197/90 (!) 163/111 138/83   Pulse: 68 93 77 78  Resp: 14 18 (!) 21 (!) 26  Temp: 100.1 F (37.8 C) (!) 102.2 F (39 C)  99.6 F (37.6 C)  TempSrc: Oral Oral  Oral  SpO2: 95% 93% 99% 99%   Constitutional: Elderly female currently in NAD, calm, comfortable Eyes: PERRL, lids and conjunctivae normal ENMT: Mucous membranes are moist. Edentulous Neck: normal, supple, no masses,   Respiratory: Decreased aeration, but no significant wheezes or rhonchi appreciated.  O2 saturations currently maintained on 2 L nasal cannula oxygen Cardiovascular: Regular rate and rhythm, no murmurs / rubs / gallops. No extremity edema. 2+ pedal pulses. No carotid bruits.  Abdomen: no tenderness, no masses palpated.   Bowel sounds positive.  Musculoskeletal: no clubbing / cyanosis. No joint deformity upper and lower extremities. Good ROM, no contractures. Normal muscle tone.  Skin: no rashes,  lesions, ulcers.   Neurologic: CN 2-12 grossly intact.   Generalized weakness. Psychiatric: Alert and oriented to self.  Data Reviewed:  Reviewed labs, imaging, and pertinent records as documented.  Assessment and Plan:  Acute respiratory failure with hypoxia Influenza A Patient presents with complaints of weakness after having 2 falls at home.  Noted to be febrile up to 102.2 F with hypoxia.  O2 saturations noted to be as low as 85% on room air with improvement on 2 L of nasal cannula.  Patient had been off of oxygen chest x-ray noting chronic cardiomegaly with lung scarring without acute abnormality.  Influenza A screening was positive. - Admit to a telemetry bed - Continuous pulse oximetry with oxygen maintain O2 saturation greater than 90% - Incentive spirometry and flutter valve - Check procalcitonin - Tamiflu  - Mucinex  - Tylenol  as needed for fever  COPD No significant wheezing noted  on physical exam. - Continue arformoterol   and revefenacin   - Albuterol  nebs as needed for shortness of breath/wheezing  Falls Patient presents after having 2 falls at home.  Weakness thought secondary to influenza.  Imaging did not find any acute abnormality. -Up with assistance - Physical/occupational therapy to evaluate and treat  Dementia Patient has mild memory deficits as reported by family with -Delirium precaution - Continue Aricept   History of DVT/pulmonary embolism Patient has a history of DVT and pulmonary embolism back in 2024 as well as 2025 - Continue Eliquis   Diastolic congestive heart failure On physical exam patient appears to be fairly euvolemic.  Chest x-ray noted stable cardiomegaly.  Last echocardiogram noted EF to be 50 to 55% with grade 1 diastolic dysfunction when checked in 2024. - Monitor intake and output - Daily weights - Check proBNP  Peripheral artery disease No acute issues noted at this time.  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  GERD - Continue PPI   DVT prophylaxis: Eliquis  Advance Care Planning:   Code Status: Do not attempt resuscitation (DNR) PRE-ARREST INTERVENTIONS DESIRED   Consults: None  Family Communication: Granddaughter updated at bedside  Severity of Illness: The appropriate patient status for this patient is OBSERVATION. Observation status is judged to be reasonable and necessary in order to provide the required intensity of service to ensure the patient's safety. The patient's presenting symptoms, physical exam findings, and initial radiographic and laboratory data in the context of their medical condition is felt to place them at decreased risk for further clinical deterioration. Furthermore, it is anticipated that the patient will be medically stable for discharge from the  hospital within 2 midnights of admission.   Author: Maximino DELENA Sharps, MD 11/26/2024 4:24 PM  For on call review [email or web address].      [1]  Allergies Allergen Reactions   Memantine  Other (See Comments)   "

## 2024-11-26 NOTE — ED Notes (Signed)
 Dinner tray ordered.

## 2024-11-26 NOTE — ED Provider Notes (Signed)
 " [place] [facility] Provider Note   CSN: [id number] Arrival date & time: 11/26/24  1047     Patient presents with: No chief complaint on file.   Amanda [name] is a 89 y.o. female.   Patient is a 89 year old female who presents with general weakness.  She has a history of dementia, DVT/PE on Eliquis , hypertension, COPD, recent gastric mass which was resected in August of this year and showed adenocarcinoma.  History is mostly obtained from her daughter who is at bedside.  They note that patient had a fall this morning.  Per EMS reports and bystanders at the scene, she had slid off the sofa.  She was denying any injuries.  They do not note any recent illnesses.  She was noted to be febrile here in the ED.  She was also noted to be hypoxic with an oxygen saturation of 85% on room air by EMS.  She does not report any recent vomiting or diarrhea.  Her daughter notes that she has been coughing up some phlegm.       Prior to Admission medications  Medication Sig Start Date End Date Taking? Authorizing Provider  acetaminophen  (TYLENOL ) 325 MG tablet Take 2 tablets (650 mg total) by mouth every 6 (six) hours as needed for mild pain, moderate pain, fever or headache (or Fever >/= 101). Patient not taking: Reported on 09/15/2024 12/02/22   Tobie Yetta HERO, MD  amLODipine  (NORVASC ) 5 MG tablet Take 5 mg by mouth daily.    [provider]  arformoterol  (BROVANA ) 15 MCG/2ML NEBU Take 2 mLs (15 mcg total) by nebulization 2 (two) times daily. 04/02/23   Meade Verdon RAMAN, MD  citalopram  (CELEXA ) 20 MG tablet Take 1 tablet (20 mg total) by mouth every morning. 12/02/22   Tobie Yetta HERO, MD  cyanocobalamin  (VITAMIN B12) 1000 MCG tablet Take 1,000 mcg by mouth every morning.    [provider]  Docusate Sodium (COLACE PO) Take by mouth. Patient taking differently: Take 1 capsule by mouth every other day.    [provider]  donepezil   (ARICEPT ) 10 MG tablet Take 10 mg by mouth at bedtime.    [provider]  ELIQUIS  2.5 MG TABS tablet Take 2.5 mg by mouth 2 (two) times daily. 08/26/24   [provider]  famotidine (PEPCID) 20 MG tablet Take 20 mg by mouth daily. 09/17/23   [provider]  ferrous sulfate  325 (65 FE) MG tablet Take 1 tablet (325 mg total) by mouth 2 (two) times daily with a meal. Patient taking differently: Take 325 mg by mouth daily with breakfast. 05/01/24   Cindy Garnette POUR, MD  latanoprost  (XALATAN ) 0.005 % ophthalmic solution Place 1 drop into both eyes every evening.    [provider]  metoprolol  succinate (TOPROL -XL) 25 MG 24 hr tablet Take 0.5 tablets (12.5 mg total) by mouth daily. 05/02/24 09/15/24  Cindy Garnette POUR, MD  Multiple Vitamins-Minerals (CENTRUM ADULT PO) Take 1 tablet by mouth daily at 6 (six) AM.    [provider]  pantoprazole  (PROTONIX ) 40 MG tablet Take 1 tablet (40 mg total) by mouth 2 (two) times daily. 12/02/22   Tobie Yetta HERO, MD  revefenacin  (YUPELRI ) 175 MCG/3ML nebulizer solution Take 3 mLs (175 mcg total) by nebulization daily. 04/02/23   Meade Verdon RAMAN, MD    Allergies: Memantine     Review of Systems  Unable to perform ROS: Dementia    Updated Vital Signs  BP 136/77 (BP Location: Right Arm)   Pulse 77   Temp 99.2 F (37.3 C) (Oral)   Resp (!) 23   SpO2 99%   Physical Exam Constitutional:      Appearance: She is well-developed.  HENT:     Head: Normocephalic and atraumatic.  Eyes:     Pupils: Pupils are equal, round, and reactive to light.  Neck:     Comments: No spinal tenderness Cardiovascular:     Rate and Rhythm: Normal rate and regular rhythm.     Heart sounds: Normal heart sounds.  Pulmonary:     Effort: Pulmonary effort is normal. No respiratory distress.     Breath sounds: Normal breath sounds. No wheezing or rales.  Chest:     Chest wall: No tenderness.  Abdominal:     General: Bowel sounds are normal.      Palpations: Abdomen is soft.     Tenderness: There is no abdominal tenderness. There is no guarding or rebound.  Musculoskeletal:        General: Normal range of motion.     Cervical back: Normal range of motion and neck supple.     Comments: No pain with range of motion extremities  Lymphadenopathy:     Cervical: No cervical adenopathy.  Skin:    General: Skin is warm and dry.     Findings: No rash.  Neurological:     General: No focal deficit present.     Mental Status: She is alert.     Comments: Patient is alert to person, place and she says it is 2025.  She moves all extremities symmetrically without obvious focal deficits     (all labs ordered are listed, but only abnormal results are displayed) Labs Reviewed  RESP PANEL BY RT-PCR (RSV, FLU A&B, COVID)  RVPGX2 - Abnormal; Notable for the following components:      Result Value   Influenza A by PCR POSITIVE (*)    All other components within normal limits  CBC - Abnormal; Notable for the following components:   Hemoglobin 15.2 (*)    HCT 46.5 (*)    All other components within normal limits  I-STAT VENOUS BLOOD GAS, ED - Abnormal; Notable for the following components:   pO2, Ven 62 (*)    Bicarbonate 30.5 (*)    Acid-Base Excess 5.0 (*)    Calcium, Ion 1.03 (*)    Hemoglobin 15.3 (*)    All other components within normal limits  I-STAT CHEM 8, ED - Abnormal; Notable for the following components:   Glucose, Bld 103 (*)    Calcium, Ion 1.05 (*)    All other components within normal limits  COMPREHENSIVE METABOLIC PANEL WITH GFR  URINALYSIS, ROUTINE W REFLEX MICROSCOPIC    EKG: EKG Interpretation Date/Time:  Wednesday November 26 2024 15:27:28 EST Ventricular Rate:  79 PR Interval:  149 QRS Duration:  114 QT Interval:  417 QTC Calculation: 478 R Axis:   -5  Text Interpretation: Sinus rhythm Probable left atrial enlargement Left ventricular hypertrophy Anterior Q waves, possibly due to LVH since last tracing  no significant change Confirmed by Lenor Hollering [phone]) on 11/26/2024 3:31:46 PM  Radiology: CT Head Wo Contrast Result Date: 11/26/2024 CLINICAL DATA:  Status post fall. EXAM: CT HEAD WITHOUT CONTRAST TECHNIQUE: Contiguous axial images were obtained from the base of the skull through the vertex without intravenous contrast. RADIATION DOSE REDUCTION: This exam was performed according to the departmental dose-optimization program which includes automated  exposure control, adjustment of the mA and/or kV according to patient size and/or use of iterative reconstruction technique. COMPARISON:  August 16, 2023 FINDINGS: Brain: There is mild cerebral atrophy with widening of the extra-axial spaces and ventricular dilatation. There are areas of decreased attenuation within the white matter tracts of the supratentorial brain, consistent with microvascular disease changes. A small, chronic left basal ganglial lacunar infarct is noted. Vascular: No hyperdense vessel or unexpected calcification. Skull: Normal. Negative for fracture or focal lesion. Sinuses/Orbits: No acute finding. Other: None. IMPRESSION: 1. No acute intracranial abnormality. 2. Generalized cerebral atrophy and microvascular disease changes of the supratentorial brain. 3. Small, chronic left basal ganglial lacunar infarct. Electronically Signed   By: Suzen Dials M.D.   On: 11/26/2024 13:00   DG Chest 1 View Result Date: 11/26/2024 EXAM: 1 VIEW(S) XRAY OF THE CHEST 11/26/2024 11:47:00 AM COMPARISON: 04/11/2024 CLINICAL HISTORY: 89 year old female with cough and fall. FINDINGS: LUNGS AND PLEURA: Pulmonary interstitial prominence is chronic and stable. Small nodular density in the peripheral left lower lobe appears to correspond to an area of curvilinear scarring on CT last year. No acute lung opacity. No pleural effusion. No pneumothorax. HEART AND MEDIASTINUM: Cardiomegaly. BONES AND SOFT TISSUES: Thoracic degenerative changes. No acute osseous  abnormality. IMPRESSION: 1. Chronic cardiomegaly and lung scarring. No acute cardiopulmonary abnormality. Electronically signed by: Helayne Hurst MD 11/26/2024 12:03 PM EST RP Workstation: [id number]   DG Lumbar Spine Complete Result Date: 11/26/2024 EXAM: 4 VIEW(S) XRAY OF THE LUMBAR SPINE 11/26/2024 11:47:00 AM COMPARISON: CT abdomen and pelvis 09/19/2023. CLINICAL HISTORY: 89 year old female. Fall. FINDINGS: LUMBAR SPINE: BONES: Normal lumbar segmentation. Vertebral body heights are maintained. Alignment is normal. Stable lordosis. Stable vertebral height and disc spaces. Chronic generalized osteopenia. Previously intact visible lower thoracic levels. No acute osseous abnormality. DISCS AND DEGENERATIVE CHANGES: Chronic degenerative interbody ankylosis at the lumbosacral junction. No severe degenerative changes. SOFT TISSUES: Advanced chronic calcified abdominal aortic atherosclerosis. Negative bowel gas pattern. No acute abnormality. IMPRESSION: 1. No acute osseous abnormality identified in the lumbar spine. 2. Advanced chronic calcified abdominal aortic atherosclerosis. Electronically signed by: Helayne Hurst MD 11/26/2024 11:59 AM EST RP Workstation: [id number]     Procedures   Medications Ordered in the ED  sodium chloride  flush (NS) 0.9 % injection 3 mL (has no administration in time range)  oseltamivir  (TAMIFLU ) capsule 75 mg (has no administration in time range)  acetaminophen  (TYLENOL ) tablet 650 mg (has no administration in time range)    Or  acetaminophen  (TYLENOL ) suppository 650 mg (has no administration in time range)  ondansetron  (ZOFRAN ) tablet 4 mg (has no administration in time range)    Or  ondansetron  (ZOFRAN ) injection 4 mg (has no administration in time range)  albuterol  (PROVENTIL ) (2.5 MG/3ML) 0.083% nebulizer solution 2.5 mg (has no administration in time range)  acetaminophen  (TYLENOL ) tablet 325 mg (325 mg Oral Given 11/26/24 1350)                                     Medical Decision Making Risk Decision regarding hospitalization.   This patient presents to the ED for concern of fever, fall, this involves an extensive number of treatment options, and is a complaint that carries with it a high risk of complications and morbidity.  I considered the following differential and admission for this acute, potentially life threatening condition.  The differential diagnosis includes syncope, seizure, sepsis, dehydration, pneumonia,  influenza, other viral syndrome, meningitis, intracranial hemorrhage  MDM:    Patient is a 89 year old who presents with general weakness and fatigue.  She had a fall this morning.  It sounds fairly minor and sounds like she slid off the couch onto her buttocks.  She is not complaining of any injuries.  She does not have any spinal tenderness.  No neurologic deficits.  She had a head CT which does not show any evidence of intracranial hemorrhage.  Her labs show positive influenza test.  Her hemoglobin is normal.  Electrolytes are within normal limits.  Chest x-ray does not show any evidence of pneumonia.  She is requiring oxygen by nasal cannula.  Will plan admission.  Discussed with Dr. Claudene.  (Labs, imaging, consults)  Labs: I Ordered, and personally interpreted labs.  The pertinent results include: Positive flu test, otherwise nonconcerning  Imaging Studies ordered: I ordered imaging studies including chest x-ray, head CT I independently visualized and interpreted imaging. I agree with the radiologist interpretation  Additional history obtained from daughter at bedside.  External records from outside source obtained and reviewed including prior notes  Cardiac Monitoring: The patient was maintained on a cardiac monitor.  If on the cardiac monitor, I personally viewed and interpreted the cardiac monitored which showed an underlying rhythm of: Sinus rhythm  Reevaluation: After the interventions noted above, I reevaluated the  patient and found that they have :improved  Social Determinants of Health:    Disposition: Admit to hospital  Co morbidities that complicate the patient evaluation  Past Medical History:  Diagnosis Date   Depression    DVT (deep venous thrombosis) (HCC)    Hyperlipidemia    Hypertension    Memory disorder    Osteoporosis    PVC (premature ventricular contraction)      Medicines Meds ordered this encounter  Medications   acetaminophen  (TYLENOL ) tablet 325 mg   sodium chloride  flush (NS) 0.9 % injection 3 mL   oseltamivir  (TAMIFLU ) capsule 75 mg   OR Linked Order Group    acetaminophen  (TYLENOL ) tablet 650 mg    acetaminophen  (TYLENOL ) suppository 650 mg   OR Linked Order Group    ondansetron  (ZOFRAN ) tablet 4 mg    ondansetron  (ZOFRAN ) injection 4 mg   albuterol  (PROVENTIL ) (2.5 MG/3ML) 0.083% nebulizer solution 2.5 mg    I have reviewed the patients home medicines and have made adjustments as needed  Problem List / ED Course: Problem List Items Addressed This Visit   None            Final diagnoses:  None    ED Discharge Orders     None          Lenor Hollering, MD 11/26/24 1648  "

## 2024-11-26 NOTE — ED Notes (Signed)
Got patient into a gown on the monitor patient has family at bedside and call bell in reach

## 2024-11-27 ENCOUNTER — Other Ambulatory Visit: Payer: Self-pay

## 2024-11-27 LAB — BASIC METABOLIC PANEL WITH GFR
Anion gap: 10 (ref 5–15)
BUN: 14 mg/dL (ref 8–23)
CO2: 27 mmol/L (ref 22–32)
Calcium: 9.2 mg/dL (ref 8.9–10.3)
Chloride: 100 mmol/L (ref 98–111)
Creatinine, Ser: 0.72 mg/dL (ref 0.44–1.00)
GFR, Estimated: >60 mL/min
Glucose, Bld: 102 mg/dL — ABNORMAL HIGH (ref 70–99)
Potassium: 3.9 mmol/L (ref 3.5–5.1)
Sodium: 137 mmol/L (ref 135–145)

## 2024-11-27 LAB — CBC
HCT: 42.8 % (ref 36.0–46.0)
Hemoglobin: 14.1 g/dL (ref 12.0–15.0)
MCH: 31.1 pg (ref 26.0–34.0)
MCHC: 32.9 g/dL (ref 30.0–36.0)
MCV: 94.3 fL (ref 80.0–100.0)
Platelets: 172 K/uL (ref 150–400)
RBC: 4.54 MIL/uL (ref 3.87–5.11)
RDW: 14.2 % (ref 11.5–15.5)
WBC: 6.6 K/uL (ref 4.0–10.5)
nRBC: 0 % (ref 0.0–0.2)

## 2024-11-27 MED ORDER — GUAIFENESIN-DM 100-10 MG/5ML PO SYRP: 5.0000 mL | ORAL_SOLUTION | ORAL | Status: DC | PRN

## 2024-11-27 MED ORDER — OSELTAMIVIR PHOSPHATE 30 MG PO CAPS
30.0000 mg | ORAL_CAPSULE | Freq: Two times a day (BID) | ORAL | Status: DC
  Administered 2024-11-28: 30 mg via ORAL
  Filled 2024-11-27 (×2): qty 1

## 2024-11-27 NOTE — Progress Notes (Signed)
 " PROGRESS NOTE  Amanda [name] FMW:[id number] DOB: [birth year] DOA: 11/26/2024 PCP: Rolinda Millman, MD   LOS: 1 day   Brief narrative:  Amanda [name] is a 89 y.o. female with medical history significant of hypertension, hyperlipidemia, pulmonary embolism /DVT, gastric mass s/p resection and dementia, history of pulmonary embolism presented to hospital with chills and dizziness with falls.  She has history of gastric mass status post removal in the past.  In the ED patient was febrile with tachypnea and blood pressure was elevated.  Labs were notable for positive influenza A screening.  CT scan of the head did not reveal any acute abnormality.  Chest x-ray noted chronic cardiomegaly and lung scarring without acute abnormality.  No acute abnormality noted of the lumbar spine.  Patient was given acetaminophen  325 mg p.o and was admitted hospital for further evaluation and treatment.  Assessment/Plan: Principal Problem:   Influenza A Active Problems:   Acute respiratory failure with hypoxia (HCC)   COPD (chronic obstructive pulmonary disease) (HCC)   Falls   Unspecified dementia, unspecified severity, without behavioral disturbance, psychotic disturbance, mood disturbance, and anxiety (HCC)   History of pulmonary embolism   History of DVT (deep vein thrombosis)   Chronic diastolic CHF (congestive heart failure) (HCC)   Peripheral vascular disease, unspecified   History of gastric polyp   GERD (gastroesophageal reflux disease)   Acute respiratory failure with hypoxia Influenza A Patient had fever with hypoxia on presentation and is on supplemental oxygen.  Chest x-ray with lung scarring and cardiomegaly.  Continue incentive spirometry.  Tamiflu  Mucinex  Tylenol .  Temperature max of 102.9 F.  Will continue to have symptoms of cough congestion and shortness of breath.  Will continue current level of care.  On nasal cannula oxygen at this time.  Will continue to wean as able.    COPD Continue inhalers with arformoterol   and revefenacin .  Continue albuterol  nebs as needed for shortness of breath/wheezing   Falls with generalized weakness. 2 falls at home.  Will get PT OT evaluation.   Dementia Mild.  On Aricept .  Continue  History of DVT/pulmonary embolism History of DVT PE in 2024 and 2025.  Continue Eliquis    Diastolic congestive heart failure Compensated at this time.  Chest x-ray with cardiomegaly.  Review of previous 2D echocardiogram from 2024 showed LV ejection fraction of 50 to 55% with grade 1 diastolic dysfunction.  Continue intake and output charting Daily weights..   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   GERD Continue PPI   DVT prophylaxis: apixaban  (ELIQUIS ) tablet 2.5 mg Start: 11/26/24 2200 apixaban  (ELIQUIS ) tablet 2.5 mg   Disposition: Likely home with home health in 1 to 2 days will get PT evaluation  Status is: Inpatient Remains inpatient appropriate because: Pending clinical improvement, on supplemental oxygen    Code Status:     Code Status: Do not attempt resuscitation (DNR) PRE-ARREST INTERVENTIONS DESIRED  Family Communication: Spoke with the patient's granddaughter at bedside  Consultants: None  Procedures: None  Anti-infectives:  Tamiflu   Anti-infectives (From admission, onward)    Start     Dose/Rate Route Frequency Ordered Stop   11/26/24 2200  oseltamivir  (TAMIFLU ) capsule 75 mg        75 mg Oral 2 times daily 11/26/24 1641 12/01/24 2159  Subjective: Today, patient was seen and examined at bedside.  Patient complains of cough congestion shortness of breath.  Feels weak.  Patient's granddaughter at bedside.  Objective: Vitals:   11/27/24 0120 11/27/24 0334  BP: (!) 151/85 128/71  Pulse: 88 84  Resp:  18 18  Temp: (!) 102.9 F (39.4 C) 99.1 F (37.3 C)  SpO2: 96% 96%    Intake/Output Summary (Last 24 hours) at 11/27/2024 0955 Last data filed at 11/27/2024 0100 Gross per 24 hour  Intake 120 ml  Output --  Net 120 ml   Filed Weights   11/26/24 2000 11/27/24 0408  Weight: 44.3 kg 45.1 kg   Body mass index is 18.2 kg/m.   Physical Exam: GENERAL: Patient is alert awake and oriented. Not in obvious distress.  Thinly built, nasal cannula in place.  Edentulous HENT: No scleral pallor or icterus. Pupils equally reactive to light. Oral mucosa is moist NECK: is supple, no gross swelling noted. CHEST: Coarse breath sounds noted bilaterally. CVS: S1 and S2 heard, no murmur. Regular rate and rhythm.  ABDOMEN: Soft, non-tender, bowel sounds are present. EXTREMITIES: No edema.  Thin extremities CNS: Cranial nerves are intact. No focal motor deficits. SKIN: warm and dry without rashes.  Data Review: I have personally reviewed the following laboratory data and studies,  CBC: Recent Labs  Lab 11/26/24 1250 11/26/24 1306 11/26/24 1534 11/27/24 0652  WBC 8.2  --   --  6.6  HGB 15.2* 15.3* 14.3 14.1  HCT 46.5* 45.0 42.0 42.8  MCV 94.5  --   --  94.3  PLT 228  --   --  172   Basic Metabolic Panel: Recent Labs  Lab 11/26/24 1306 11/26/24 1525 11/26/24 1534 11/27/24 0652  NA 139 137 137 137  K 4.9 4.1 3.7 3.9  CL  --  99 99 100  CO2  --  26  --  27  GLUCOSE  --  98 103* 102*  BUN  --  12 13 14   CREATININE  --  0.73 0.80 0.72  CALCIUM  --  8.9  --  9.2   Liver Function Tests: Recent Labs  Lab 11/26/24 1525  AST 27  ALT 15  ALKPHOS 48  BILITOT 0.4  PROT 7.0  ALBUMIN 3.8   No results for input(s): LIPASE, AMYLASE in the last 168 hours. No results for input(s): AMMONIA in the last 168 hours. Cardiac Enzymes: No results for input(s): CKTOTAL, CKMB, CKMBINDEX, TROPONINI in the last 168 hours. BNP (last 3 results) No results for input(s): BNP in the last  8760 hours.  ProBNP (last 3 results) Recent Labs    11/26/24 1332  PROBNP 1,884.0*    CBG: No results for input(s): GLUCAP in the last 168 hours. Recent Results (from the past 240 hours)  Resp panel by RT-PCR (RSV, Flu A&B, Covid) Anterior Nasal Swab     Status: Abnormal   Collection Time: 11/26/24 11:16 AM   Specimen: Anterior Nasal Swab  Result Value Ref Range Status   SARS Coronavirus 2 by RT PCR NEGATIVE NEGATIVE Final   Influenza A by PCR POSITIVE (A) NEGATIVE Final   Influenza B by PCR NEGATIVE NEGATIVE Final    Comment: (NOTE) The Xpert Xpress SARS-CoV-2/FLU/RSV plus assay is intended as an aid in the diagnosis of influenza from Nasopharyngeal swab specimens and should not be used as a sole basis for treatment. Nasal washings and aspirates are unacceptable for Xpert Xpress SARS-CoV-2/FLU/RSV testing.  Fact Sheet for Patients: bloggercourse.com  Fact Sheet for Healthcare Providers: seriousbroker.it  This test is not yet approved or cleared by the [place]  FDA and has been authorized for detection and/or diagnosis of SARS-CoV-2 by FDA under an Emergency Use Authorization (EUA). This EUA will remain in effect (meaning this test can be used) for the duration of the COVID-19 declaration under Section 564(b)(1) of the Act, 21 U.S.C. section 360bbb-3(b)(1), unless the authorization is terminated or revoked.     Resp Syncytial Virus by PCR NEGATIVE NEGATIVE Final    Comment: (NOTE) Fact Sheet for Patients: bloggercourse.com  Fact Sheet for Healthcare Providers: seriousbroker.it  This test is not yet approved or cleared by the [place]  FDA and has been authorized for detection and/or diagnosis of SARS-CoV-2 by FDA under an Emergency Use Authorization (EUA). This EUA will remain in effect (meaning this test can be used) for the duration of the COVID-19  declaration under Section 564(b)(1) of the Act, 21 U.S.C. section 360bbb-3(b)(1), unless the authorization is terminated or revoked.  Performed at [facility], [address], [place], [place] [address]      Studies: CT Head Wo Contrast Result Date: 11/26/2024 CLINICAL DATA:  Status post fall. EXAM: CT HEAD WITHOUT CONTRAST TECHNIQUE: Contiguous axial images were obtained from the base of the skull through the vertex without intravenous contrast. RADIATION DOSE REDUCTION: This exam was performed according to the departmental dose-optimization program which includes automated exposure control, adjustment of the mA and/or kV according to patient size and/or use of iterative reconstruction technique. COMPARISON:  August 16, 2023 FINDINGS: Brain: There is mild cerebral atrophy with widening of the extra-axial spaces and ventricular dilatation. There are areas of decreased attenuation within the white matter tracts of the supratentorial brain, consistent with microvascular disease changes. A small, chronic left basal ganglial lacunar infarct is noted. Vascular: No hyperdense vessel or unexpected calcification. Skull: Normal. Negative for fracture or focal lesion. Sinuses/Orbits: No acute finding. Other: None. IMPRESSION: 1. No acute intracranial abnormality. 2. Generalized cerebral atrophy and microvascular disease changes of the supratentorial brain. 3. Small, chronic left basal ganglial lacunar infarct. Electronically Signed   By: Suzen Dials M.D.   On: 11/26/2024 13:00   DG Chest 1 View Result Date: 11/26/2024 EXAM: 1 VIEW(S) XRAY OF THE CHEST 11/26/2024 11:47:00 AM COMPARISON: 04/11/2024 CLINICAL HISTORY: 89 year old female with cough and fall. FINDINGS: LUNGS AND PLEURA: Pulmonary interstitial prominence is chronic and stable. Small nodular density in the peripheral left lower lobe appears to correspond to an area of curvilinear scarring on CT last year. No acute lung opacity. No pleural  effusion. No pneumothorax. HEART AND MEDIASTINUM: Cardiomegaly. BONES AND SOFT TISSUES: Thoracic degenerative changes. No acute osseous abnormality. IMPRESSION: 1. Chronic cardiomegaly and lung scarring. No acute cardiopulmonary abnormality. Electronically signed by: Helayne Hurst MD 11/26/2024 12:03 PM EST RP Workstation: [id number]   DG Lumbar Spine Complete Result Date: 11/26/2024 EXAM: 4 VIEW(S) XRAY OF THE LUMBAR SPINE 11/26/2024 11:47:00 AM COMPARISON: CT abdomen and pelvis 09/19/2023. CLINICAL HISTORY: 89 year old female. Fall. FINDINGS: LUMBAR SPINE: BONES: Normal lumbar segmentation. Vertebral body heights are maintained. Alignment is normal. Stable lordosis. Stable vertebral height and disc spaces. Chronic generalized osteopenia. Previously intact visible lower thoracic levels. No acute osseous abnormality. DISCS AND DEGENERATIVE CHANGES: Chronic degenerative interbody ankylosis at the lumbosacral junction. No severe degenerative changes. SOFT TISSUES: Advanced chronic calcified abdominal aortic atherosclerosis. Negative bowel gas pattern. No acute abnormality. IMPRESSION: 1. No acute osseous abnormality identified in the lumbar spine. 2. Advanced chronic calcified  abdominal aortic atherosclerosis. Electronically signed by: Helayne Hurst MD 11/26/2024 11:59 AM EST RP Workstation: [id number]      Vernal Alstrom, MD  [facility] 11/27/2024  If 7PM-7AM, please contact night-coverage             "

## 2024-11-27 NOTE — Progress Notes (Signed)
" °   11/27/24 1301  What Happened  Was fall witnessed? No  Was patient injured? Unsure  Patient found on floor  Found by Staff-comment Rheta, RN)  Stated prior activity bathroom-unassisted  Provider Notification  Provider Name/Title Pokhrel, MD  Date Provider Notified 11/27/24  Time Provider Notified 1300  Method of Notification  (chat)  Notification Reason Fall  Provider response No new orders  Date of Provider Response 11/27/24  Time of Provider Response 1320  Follow Up  Family notified Yes - comment (dtr called by NM)  Adult Fall Risk Assessment  Risk Factor Category (scoring not indicated) Fall has occurred during this admission (document High fall risk)  Patient Fall Risk Level High fall risk  Adult Fall Risk Interventions  Required Bundle Interventions *See Row Information* High fall risk  Additional Interventions Use of appropriate toileting equipment (bedpan, BSC, etc.);Room near nurses station;PT/OT need assessed if change in mobility from baseline  Screening for Fall Injury Risk (To be completed on HIGH fall risk patients) - Assessing Need for Floor Mats  Risk For Fall Injury- Criteria for Floor Mats Previous fall this admission  Will Implement Floor Mats Yes  Vitals  BP (!) 168/85  MAP (mmHg) 109  BP Location Right Arm  BP Method Automatic  Patient Position (if appropriate) Sitting  Resp 17  Oxygen Therapy  SpO2 99 %  O2 Device Room Air  Pain Assessment  Pain Scale 0-10  Pain Score 0  PCA/Epidural/Spinal Assessment  Respiratory Pattern Regular;Unlabored  Neurological  Neuro (WDL) X  Level of Consciousness Alert  Orientation Level Oriented to person;Oriented to time;Disoriented to place;Disoriented to situation  Cognition Appropriate at baseline  Glasgow Coma Scale  Eye Opening 4  Best Verbal Response (NON-intubated) 5  Best Motor Response 6  Glasgow Coma Scale Score 15  Musculoskeletal  Musculoskeletal (WDL) X  Assistive Device BSC;Front wheel walker   Generalized Weakness Yes  Integumentary  Integumentary (WDL) X    "

## 2024-11-27 NOTE — Progress Notes (Signed)
 PT Cancellation Note  Patient Details Name: Amanda [name] MRN: [id number] DOB: January 26, 1933   Cancelled Treatment:    Reason Eval/Treat Not Completed: Fatigue/lethargy limiting ability to participate; attempted twice to see pt, initially eating then too fatigued to walk requesting PT return next day.  Will continue attempts.    Montie Portal 11/27/2024, 4:21 PM Micheline Portal, PT Acute Rehabilitation Services [phone] 11/27/2024

## 2024-11-27 NOTE — Plan of Care (Signed)
  Problem: Clinical Measurements: Goal: Ability to maintain clinical measurements within normal limits will improve Outcome: Progressing   Problem: Nutrition: Goal: Adequate nutrition will be maintained Outcome: Progressing   Problem: Elimination: Goal: Will not experience complications related to bowel motility Outcome: Progressing Goal: Will not experience complications related to urinary retention Outcome: Progressing   Problem: Pain Managment: Goal: General experience of comfort will improve and/or be controlled Outcome: Progressing   Problem: Safety: Goal: Ability to remain free from injury will improve Outcome: Progressing

## 2024-11-27 NOTE — Hospital Course (Signed)
 Amanda [name] is a 89 y.o. female with medical history significant of hypertension, hyperlipidemia, pulmonary embolism /DVT, gastric mass s/p resection and dementia, history of pulmonary embolism presented to hospital with chills and dizziness with falls.  She has history of gastric mass status post removal in the past.  In the ED patient was febrile with tachypnea and blood pressure was elevated.  Labs were notable for positive influenza A screening.  CT scan of the head did not reveal any acute abnormality.  Chest x-ray noted chronic cardiomegaly and lung scarring without acute abnormality.  No acute abnormality noted of the lumbar spine.  Patient was given acetaminophen  325 mg p.o and was admitted hospital for further evaluation and treatment.  Acute respiratory failure with hypoxia Influenza A Patient had fever with hypoxia on presentation and is on supplemental oxygen.  Chest x-ray with lung scarring and cardiomegaly.  Continue incentive spirometry.  Tamiflu  Mucinex  Tylenol .  Temperature max of 102.9 F.   COPD Continue inhalers with arformoterol   and revefenacin .  Continue albuterol  nebs as needed for shortness of breath/wheezing   Falls with generalized weakness. 2 falls at home.  Will get PT OT evaluation.   Dementia Mild.  On Aricept .  Continue History of DVT/pulmonary embolism History of DVT PE in 2024 and 2025.  Continue Eliquis    Diastolic congestive heart failure Compensated at this time.  Chest x-ray with cardiomegaly.  Review of previous 2D echocardiogram from 2024 showed LV ejection fraction of 50 to 55% with grade 1 diastolic dysfunction.  Continue intake and output charting Daily weights..   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   GERD Continue PPI

## 2024-11-27 NOTE — Progress Notes (Signed)
 Attempted to contact patient family in regards to patient fall. No answer, voicemail left with contact number provided for family to call back with questions.   Angelly Spearing, RN

## 2024-11-27 NOTE — Progress Notes (Signed)
" °   11/27/24 0120  Assess: MEWS Score  Temp (!) 102.9 F (39.4 C)  BP (!) 151/85  Pulse Rate 88  Resp 18  Level of Consciousness Alert  SpO2 96 %  O2 Device Nasal Cannula  O2 Flow Rate (L/min) 3 L/min  Assess: MEWS Score  MEWS Temp 2  MEWS Systolic 0  MEWS Pulse 0  MEWS RR 0  MEWS LOC 0  MEWS Score 2  MEWS Score Color Yellow  Assess: if the MEWS score is Yellow or Red  Were vital signs accurate and taken at a resting state? Yes  Does the patient meet 2 or more of the SIRS criteria? No  MEWS guidelines implemented  Yes, yellow  Treat  MEWS Interventions Considered administering scheduled or prn medications/treatments as ordered  Take Vital Signs  Increase Vital Sign Frequency  Yellow: Q2hr x1, continue Q4hrs until patient remains green for 12hrs  Escalate  MEWS: Escalate Yellow: Discuss with charge nurse and consider notifying provider and/or RRT  Notify: Charge Nurse/RN  Name of Charge Nurse/RN Notified Mahima RN  Assess: SIRS CRITERIA  SIRS Temperature  1  SIRS Respirations  0  SIRS Pulse 0  SIRS WBC 0  SIRS Score Sum  1   Had fever on admission, prn tylenol  given.  "

## 2024-11-27 NOTE — Care Management (Addendum)
 Transition of Care [facility]) - Inpatient Brief Assessment   Patient Details  Name: Amanda [name] MRN: [id number] Date of Birth: [birth year]  [facility] CM/SW Contact:    Corean JAYSON Canary, RN Phone Number: 11/27/2024, 12:37 PM   Clinical Narrative: Patient presented with fall,  Flu A positive she usually lives independently with family checks. She has a walker but does not use it on a routine basis.  She is currently on oxygen, ICPM will watch for oxygen needs. PT will evaluate for any recommendations.   IPCM will follow for needs    Transition of Care Asessment: Insurance and Status: Insurance coverage has been reviewed Patient has primary care physician: Yes Home environment has been reviewed: Lives independently Prior level of function:: independent, has walker Prior/Current Home Services: No current home services Social Drivers of Health Review: SDOH reviewed no interventions necessary Readmission risk has been reviewed: Yes Transition of care needs: no transition of care needs at this time

## 2024-11-27 NOTE — Evaluation (Signed)
 Occupational Therapy Evaluation Patient Details Name: Amanda [name] MRN: [id number] DOB: 16-Jul-1933 Today's Date: 11/27/2024   History of Present Illness   Pt is an 89 y/o female presenting with weakness, fever and 2 falls at home. +Influenza A. 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 Pt presents now with deficits in cognition (baseline dementia but progressive deficits on eval), balance and overall strength. Pt requiring Min A for mobility without AD in room w/ hands on assist needed at all times to maintain balance. Pt requires overall Min A for ADL completion as well. Based on high fall risk and need for hands on assist for all standing tasks, recommend consideration of continued inpatient follow up therapy, <3 hours/day at DC unless family able to provide 24/7 assist at home. Will continue to follow acutely.      If plan is discharge home, recommend the following:   A lot of help with walking and/or transfers;A little help with bathing/dressing/bathroom;Assistance with cooking/housework;Direct supervision/assist for medications management;Direct supervision/assist for financial management;Assist for transportation;Supervision due to cognitive status     Functional Status Assessment   Patient has had a recent decline in their functional status and demonstrates the ability to make significant improvements in function in a reasonable and predictable amount of time.     Equipment Recommendations   None recommended by OT     Recommendations for Other Services         Precautions/Restrictions   Precautions Precautions: Fall Restrictions Weight Bearing Restrictions Per Provider Order: No     Mobility Bed Mobility Overal bed mobility: Needs  Assistance Bed Mobility: Supine to Sit, Sit to Supine     Supine to sit: Supervision Sit to supine: Supervision        Transfers Overall transfer level: Needs assistance Equipment used: None Transfers: Sit to/from Stand Sit to Stand: Min assist                  Balance Overall balance assessment: Needs assistance Sitting-balance support: No upper extremity supported, Feet supported Sitting balance-Leahy Scale: Fair     Standing balance support: No upper extremity supported, During functional activity Standing balance-Leahy Scale: Poor                             ADL either performed or assessed with clinical judgement   ADL Overall ADL's : Needs assistance/impaired Eating/Feeding: Independent;Sitting   Grooming: Contact guard assist;Minimal assistance;Standing;Oral care;Wash/dry face Grooming Details (indicate cue type and reason): standing at sink, hands on assist needed for balance, up to Min A. Upper Body Bathing: Set up;Sitting   Lower Body Bathing: Minimal assistance;Sitting/lateral leans;Sit to/from stand   Upper Body Dressing : Set up;Sitting   Lower Body Dressing: Minimal assistance;Sit to/from stand;Sitting/lateral leans   Toilet Transfer: Minimal assistance;Ambulation;Regular Toilet   Toileting- Clothing Manipulation and Hygiene: Minimal assistance;Sit to/from stand;Sitting/lateral lean       Functional mobility during ADLs: Minimal assistance General ADL Comments: poor balance when mobilizing without AD requiring hands on assist at all times to ensure no fall.     Vision Baseline Vision/History: 1 Wears glasses Ability to See in Adequate Light: 0 Adequate Patient Visual Report: No change from baseline Vision Assessment?: No apparent visual deficits     Perception  Praxis         Pertinent Vitals/Pain Pain Assessment Pain Assessment: No/denies pain     Extremity/Trunk Assessment Upper Extremity Assessment Upper  Extremity Assessment: Generalized weakness;Right hand dominant   Lower Extremity Assessment Lower Extremity Assessment: Defer to PT evaluation   Cervical / Trunk Assessment Cervical / Trunk Assessment: Normal   Communication Communication Communication: No apparent difficulties   Cognition Arousal: Alert Behavior During Therapy: WFL for tasks assessed/performed Cognition: Cognition impaired, History of cognitive impairments   Orientation impairments: Place, Time, Situation Awareness: Intellectual awareness impaired, Online awareness impaired Memory impairment (select all impairments): Declarative long-term memory, Short-term memory Attention impairment (select first level of impairment): Selective attention, Sustained attention Executive functioning impairment (select all impairments): Reasoning, Problem solving OT - Cognition Comments: pt pleasant, when asked where she was, pt laughed but did not answer orientation questions. Pt with poor insight into balance impairments, often telling the OT to let me go. able to redirect                 Following commands: Impaired Following commands impaired: Follows one step commands with increased time     Cueing  General Comments   Cueing Techniques: Verbal cues;Gestural cues      Exercises     Shoulder Instructions      Home Living Family/patient expects to be discharged to:: Private residence Living Arrangements: Alone Available Help at Discharge: Family;Available PRN/intermittently Type of Home: House Home Access: Stairs to enter Entrance Stairs-Number of Steps: 1 Entrance Stairs-Rails: None Home Layout: One level     Bathroom Shower/Tub: Chief Strategy Officer: Standard     Home Equipment: Agricultural Consultant (2 wheels);Shower seat;Grab bars - tub/shower;BSC/3in1   Additional Comments: home setup from prior admission      Prior Functioning/Environment Prior Level of Function : Needs assist              Mobility Comments: pt indep without AD, but doesn't drive ADLs Comments: indep with ADLs/IADLs in the home. family and friends check in often    OT Problem List: Decreased strength;Decreased activity tolerance;Impaired balance (sitting and/or standing);Decreased cognition;Decreased safety awareness;Decreased knowledge of use of DME or AE   OT Treatment/Interventions: Self-care/ADL training;Therapeutic exercise;Energy conservation;DME and/or AE instruction;Therapeutic activities;Patient/family education;Balance training      OT Goals(Current goals can be found in the care plan section)   Acute Rehab OT Goals Patient Stated Goal: take some of this food with me when i leave OT Goal Formulation: With patient Time For Goal Achievement: 12/11/24 Potential to Achieve Goals: Good   OT Frequency:  Min 2X/week    Co-evaluation              AM-PAC OT 6 Clicks Daily Activity     Outcome Measure Help from another person eating meals?: None Help from another person taking care of personal grooming?: A Little Help from another person toileting, which includes using toliet, bedpan, or urinal?: A Little Help from another person bathing (including washing, rinsing, drying)?: A Little Help from another person to put on and taking off regular upper body clothing?: A Little Help from another person to put on and taking off regular lower body clothing?: A Little 6 Click Score: 19   End of Session Equipment Utilized During Treatment: Gait belt Nurse Communication: Mobility status  Activity Tolerance: Patient tolerated treatment well Patient left: in bed;with call bell/phone within reach;with bed alarm set;Other (comment) (and fall mat)  OT Visit Diagnosis: Unsteadiness on feet (R26.81);Other  abnormalities of gait and mobility (R26.89);Muscle weakness (generalized) (M62.81);Repeated falls (R29.6)                Time: 8665-8651 OT Time Calculation (min): 14 min Charges:  OT  General Charges $OT Visit: 1 Visit OT Evaluation $OT Eval Moderate Complexity: 1 Mod  Mliss NOVAK, OTR/L Acute Rehab Services Office: [phone]   Mliss Fish 11/27/2024, 1:56 PM

## 2024-11-28 ENCOUNTER — Other Ambulatory Visit (HOSPITAL_COMMUNITY): Payer: Self-pay

## 2024-11-28 LAB — BASIC METABOLIC PANEL WITH GFR
Anion gap: 8 (ref 5–15)
BUN: 11 mg/dL (ref 8–23)
CO2: 31 mmol/L (ref 22–32)
Calcium: 8.7 mg/dL — ABNORMAL LOW (ref 8.9–10.3)
Chloride: 98 mmol/L (ref 98–111)
Creatinine, Ser: 0.59 mg/dL (ref 0.44–1.00)
GFR, Estimated: >60 mL/min
Glucose, Bld: 96 mg/dL (ref 70–99)
Potassium: 3.5 mmol/L (ref 3.5–5.1)
Sodium: 136 mmol/L (ref 135–145)

## 2024-11-28 LAB — CBC
HCT: 40.1 % (ref 36.0–46.0)
Hemoglobin: 13.1 g/dL (ref 12.0–15.0)
MCH: 30.2 pg (ref 26.0–34.0)
MCHC: 32.7 g/dL (ref 30.0–36.0)
MCV: 92.4 fL (ref 80.0–100.0)
Platelets: 189 K/uL (ref 150–400)
RBC: 4.34 MIL/uL (ref 3.87–5.11)
RDW: 13.8 % (ref 11.5–15.5)
WBC: 3.3 K/uL — ABNORMAL LOW (ref 4.0–10.5)
nRBC: 0 % (ref 0.0–0.2)

## 2024-11-28 LAB — MAGNESIUM: Magnesium: 1.9 mg/dL (ref 1.7–2.4)

## 2024-11-28 MED ORDER — OSELTAMIVIR PHOSPHATE 30 MG PO CAPS
30.0000 mg | ORAL_CAPSULE | Freq: Two times a day (BID) | ORAL | 0 refills | Status: AC
  Filled 2024-11-28: qty 5, 3d supply, fill #0

## 2024-11-28 MED ORDER — GUAIFENESIN-DM 100-10 MG/5ML PO SYRP: 5.0000 mL | ORAL_SOLUTION | ORAL | 0 refills | Status: DC | PRN

## 2024-11-28 MED ORDER — GUAIFENESIN-DM 100-10 MG/5ML PO SYRP
5.0000 mL | ORAL_SOLUTION | ORAL | 0 refills | Status: AC | PRN
  Filled 2024-11-28: qty 118, 4d supply, fill #0

## 2024-11-28 MED ORDER — ALBUTEROL SULFATE HFA 108 (90 BASE) MCG/ACT IN AERS: 2.0000 | INHALATION_SPRAY | Freq: Four times a day (QID) | RESPIRATORY_TRACT | 1 refills | Status: DC | PRN

## 2024-11-28 MED ORDER — OSELTAMIVIR PHOSPHATE 30 MG PO CAPS: 30.0000 mg | ORAL_CAPSULE | Freq: Two times a day (BID) | ORAL | 0 refills | Status: DC

## 2024-11-28 MED ORDER — ALBUTEROL SULFATE HFA 108 (90 BASE) MCG/ACT IN AERS
2.0000 | INHALATION_SPRAY | Freq: Four times a day (QID) | RESPIRATORY_TRACT | 1 refills | Status: AC | PRN
  Filled 2024-11-28: qty 6.7, 25d supply, fill #0

## 2024-11-28 NOTE — Progress Notes (Signed)
 SATURATION QUALIFICATIONS: (This note is used to comply with regulatory documentation for home oxygen)  Patient Saturations on Room Air at Rest = 90%  Patient Saturations on Room Air while Ambulating = 87%  Patient Saturations on 2 Liters of oxygen while Ambulating = 94%  Please briefly explain why patient needs home oxygen: desat with ambulation

## 2024-11-28 NOTE — Evaluation (Signed)
 Physical Therapy Evaluation Patient Details Name: Amanda [name] MRN: [id number] DOB: [birth year] Today's Date: 11/28/2024  History of Present Illness  Pt is an 89 y/o female presenting with weakness, fever and 2 falls at home. +Influenza A. 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 She has a PCA 4 days/wk and family monitors her on video camera. Pt with impaired cognition at baseline, and is currently A&O to self. She does not recall having a fall while inpatient or prior to admission. Pt overall is mobilizing fairly. Ambulating to bathroom to participate in standing ADL task at sink with good tolerance. Pt ambulating bedroom distances with no assistive device and CGA. SpO2 90% on RA, HR 101 bpm (placed on 1L O2 at end of session). Set up for breakfast as pt has difficulty opening containers. Pt granddaughter arrived at end of session; states their preference is for pt to return home vs SNF. Recommend 24/7 supervision in light of cognitive deficits and history of recent falls and HHPT to address balance, strengthening, and mobility.         If plan is discharge home, recommend the following: A little help with walking and/or transfers;A little help with bathing/dressing/bathroom;Assistance with cooking/housework;Direct supervision/assist for medications management;Direct supervision/assist for financial management;Assist for transportation;Help with stairs or ramp for entrance;Supervision due to cognitive status   Can travel by private vehicle        Equipment Recommendations None recommended by PT  Recommendations for Other Services       Functional Status Assessment Patient has had a recent decline in their functional status and demonstrates the ability to make significant improvements in function  in a reasonable and predictable amount of time.     Precautions / Restrictions Precautions Precautions: Fall Restrictions Weight Bearing Restrictions Per Provider Order: No      Mobility  Bed Mobility Overal bed mobility: Modified Independent                  Transfers Overall transfer level: Needs assistance Equipment used: None Transfers: Sit to/from Stand Sit to Stand: Contact guard assist           General transfer comment: CGA for steadying assist    Ambulation/Gait Ambulation/Gait assistance: Contact guard assist Gait Distance (Feet): 30 Feet Assistive device: None Gait Pattern/deviations: Step-through pattern, Decreased stride length Gait velocity: decreased     General Gait Details: Mild dynamic unsteadiness, CGA for safety  Stairs            Wheelchair Mobility     Tilt Bed    Modified Rankin (Stroke Patients Only)       Balance Overall balance assessment: Needs assistance Sitting-balance support: No upper extremity supported, Feet supported Sitting balance-Leahy Scale: Good     Standing balance support: No upper extremity supported, During functional activity Standing balance-Leahy Scale: Fair                               Pertinent Vitals/Pain Pain Assessment Pain Assessment: No/denies pain    Home Living Family/patient expects to be discharged to:: Private residence Living Arrangements: Alone Available Help at Discharge: Family;Available PRN/intermittently;Personal care attendant Type of Home: House Home Access: Stairs to enter Entrance Stairs-Rails: None Entrance Stairs-Number of Steps: 1   Home Layout: One level Home Equipment: Agricultural Consultant (  2 wheels);Shower seat;Grab bars - tub/shower;BSC/3in1 Additional Comments: PCA 4 days/wk    Prior Function Prior Level of Function : Needs assist             Mobility Comments: pt indep without AD, but doesn't drive ADLs Comments: indep with ADLs/IADLs in  the home. family and friends check in often     Extremity/Trunk Assessment   Upper Extremity Assessment Upper Extremity Assessment: Defer to OT evaluation    Lower Extremity Assessment Lower Extremity Assessment: Generalized weakness    Cervical / Trunk Assessment Cervical / Trunk Assessment: Normal  Communication   Communication Communication: No apparent difficulties    Cognition Arousal: Alert Behavior During Therapy: WFL for tasks assessed/performed   PT - Cognitive impairments: History of cognitive impairments                       PT - Cognition Comments: Pt A&O to self only. Reports it is December, 2025 and able to state she is in a hospital, but not which one. Oriented to city. Pt does not recall having falls Following commands: Impaired Following commands impaired: Follows one step commands with increased time     Cueing Cueing Techniques: Verbal cues, Gestural cues     General Comments      Exercises     Assessment/Plan    PT Assessment Patient needs continued PT services  PT Problem List Decreased strength;Decreased activity tolerance;Decreased balance;Decreased mobility;Decreased cognition;Decreased safety awareness       PT Treatment Interventions DME instruction;Gait training;Stair training;Functional mobility training;Therapeutic activities;Therapeutic exercise;Balance training;Patient/family education    PT Goals (Current goals can be found in the Care Plan section)  Acute Rehab PT Goals Patient Stated Goal: pt granddaughter would like her to go home PT Goal Formulation: With patient/family Time For Goal Achievement: 12/12/24 Potential to Achieve Goals: Good    Frequency Min 2X/week     Co-evaluation               AM-PAC PT 6 Clicks Mobility  Outcome Measure Help needed turning from your back to your side while in a flat bed without using bedrails?: None Help needed moving from lying on your back to sitting on the side  of a flat bed without using bedrails?: None Help needed moving to and from a bed to a chair (including a wheelchair)?: A Little Help needed standing up from a chair using your arms (e.g., wheelchair or bedside chair)?: A Little Help needed to walk in hospital room?: A Little Help needed climbing 3-5 steps with a railing? : A Little 6 Click Score: 20    End of Session Equipment Utilized During Treatment: Gait belt Activity Tolerance: Patient tolerated treatment well Patient left: in chair;with call bell/phone within reach;with chair alarm set Nurse Communication: Mobility status PT Visit Diagnosis: Unsteadiness on feet (R26.81);History of falling (Z91.81)    Time: 9191-9167 PT Time Calculation (min) (ACUTE ONLY): 24 min   Charges:   PT Evaluation $PT Eval Low Complexity: 1 Low PT Treatments $Therapeutic Activity: 8-22 mins PT General Charges $$ ACUTE PT VISIT: 1 Visit         Aleck Daring, PT, DPT Acute Rehabilitation Services Office [phone]   Aleck ONEIDA Daring 11/28/2024, 8:58 AM

## 2024-11-28 NOTE — Plan of Care (Signed)
  Problem: Nutrition: Goal: Adequate nutrition will be maintained Outcome: Progressing   Problem: Elimination: Goal: Will not experience complications related to bowel motility Outcome: Progressing   Problem: Pain Managment: Goal: General experience of comfort will improve and/or be controlled Outcome: Progressing   Problem: Safety: Goal: Ability to remain free from injury will improve Outcome: Progressing

## 2024-11-28 NOTE — Discharge Summary (Signed)
 "  Physician Discharge Summary  Amanda [name] FMW:[id number] DOB: [birth year] DOA: 11/26/2024  PCP: Amanda Millman, MD  Admit date: 11/26/2024 Discharge date: 11/28/2024  Admitted From: Home  Discharge disposition: Home with home health   Recommendations for Outpatient Follow-Up:   Follow up with your primary care provider in one week.  Check CBC, BMP, magnesium in the next visit Please reassess the need for oxygen in the next visit.  Patient has been added on albuterol  inhaler.  Discharge Diagnosis:   Principal Problem:   Influenza A Active Problems:   Acute respiratory failure with hypoxia (HCC)   COPD (chronic obstructive pulmonary disease) (HCC)   Falls   Unspecified dementia, unspecified severity, without behavioral disturbance, psychotic disturbance, mood disturbance, and anxiety (HCC)   History of pulmonary embolism   History of DVT (deep vein thrombosis)   Chronic diastolic CHF (congestive heart failure) (HCC)   Peripheral vascular disease, unspecified   History of gastric polyp   GERD (gastroesophageal reflux disease)   Discharge Condition: Improved.  Diet recommendation: Soft diet  Wound care: None.  Code status: DNR   History of Present Illness:   Amanda [name] is a 89 y.o. female with medical history significant of hypertension, hyperlipidemia, pulmonary embolism /DVT, gastric mass s/p resection and dementia, history of pulmonary embolism presented to hospital with chills and dizziness with falls.  She has history of gastric mass status post removal in the past.  In the ED patient was febrile with tachypnea and blood pressure was elevated.  Labs were notable for positive influenza A screening.  CT scan of the head did not reveal any acute abnormality.  Chest x-ray noted chronic cardiomegaly and lung scarring without acute abnormality.  No acute abnormality noted of the lumbar spine.  Patient was given acetaminophen  325 mg p.o and was admitted  hospital for further evaluation and treatment.    Hospital Course:   Following conditions were addressed during hospitalization as listed below,  Acute respiratory failure with hypoxia Influenza A Patient had fever with hypoxia on presentation and is on supplemental oxygen.  Chest x-ray with lung scarring and cardiomegaly.  Continue incentive spirometry.  Patient received Tamiflu  Mucinex  Tylenol .  Temperature max of 99.8 Fahrenheit.    On nasal cannula oxygen at this time.  Patient has qualified for home oxygen on discharge.  Advised to continue bronchodilators and oxygen on discharge and follow-up with PCP    COPD Continue inhalers with arformoterol   and revefenacin .  Will add albuterol  inhaler as well on discharge.    Falls with generalized weakness. 2 falls at home.  PT OT has seen the patient and recommend home health PT on discharge.   Dementia Mild.  On Aricept .  Continue   History of DVT/pulmonary embolism History of DVT PE in 2024 and 2025.  Continue Eliquis    Diastolic congestive heart failure Compensated at this time.  Chest x-ray with cardiomegaly.  Review of previous 2D echocardiogram from 2024 showed LV ejection fraction of 50 to 55% with grade 1 diastolic dysfunction.    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   GERD Continue famotidine from home.   Disposition.  At this time, patient is stable for disposition home with home health with outpatient PCP follow-up  Medical Consultants:   None.  Procedures:    None Subjective:  Today, patient was seen and examined at bedside.  Denies any nausea vomiting fever chills but has some cough and congestion.  Still on supplemental oxygen.  Patient's granddaughter at bedside.  Discharge Exam:   Vitals:   11/28/24 0801  11/28/24 0803  BP:  (!) 154/82  Pulse: 68 70  Resp: 16 17  Temp:  97.7 F (36.5 C)  SpO2: 100% 100%   Vitals:   11/28/24 0222 11/28/24 0500 11/28/24 0801 11/28/24 0803  BP: (!) 148/90   (!) 154/82  Pulse: 92  68 70  Resp: 18  16 17   Temp: 99.1 F (37.3 C)   97.7 F (36.5 C)  TempSrc: Oral     SpO2: 95%  100% 100%  Weight:  45.1 kg    Height:       Body mass index is 18.2 kg/m.   General: Alert awake, not in obvious distress, thinly built, on nasal cannula oxygen, elderly female, HENT: pupils equally reacting to light,  No scleral pallor or icterus noted. Oral mucosa is moist.  Chest: Coarse breath sounds noted bilaterally CVS: S1 &S2 heard. No murmur.  Regular rate and rhythm. Abdomen: Soft, nontender, nondistended.  Bowel sounds are heard.   Extremities: No cyanosis, clubbing or edema.  Peripheral pulses are palpable. Psych: Alert, awake and oriented, normal mood CNS:  No cranial nerve deficits.  Power equal in all extremities.  Generalized weakness noted Skin: Warm and dry.  No rashes noted.  The results of significant diagnostics from this hospitalization (including imaging, microbiology, ancillary and laboratory) are listed below for reference.     Diagnostic Studies:   CT Head Wo Contrast Result Date: 11/26/2024 CLINICAL DATA:  Status post fall. EXAM: CT HEAD WITHOUT CONTRAST TECHNIQUE: Contiguous axial images were obtained from the base of the skull through the vertex without intravenous contrast. RADIATION DOSE REDUCTION: This exam was performed according to the departmental dose-optimization program which includes automated exposure control, adjustment of the mA and/or kV according to patient size and/or use of iterative reconstruction technique. COMPARISON:  August 16, 2023 FINDINGS: Brain: There is mild cerebral atrophy with widening of the extra-axial spaces and ventricular dilatation. There are areas of decreased attenuation within the white matter tracts of the  supratentorial brain, consistent with microvascular disease changes. A small, chronic left basal ganglial lacunar infarct is noted. Vascular: No hyperdense vessel or unexpected calcification. Skull: Normal. Negative for fracture or focal lesion. Sinuses/Orbits: No acute finding. Other: None. IMPRESSION: 1. No acute intracranial abnormality. 2. Generalized cerebral atrophy and microvascular disease changes of the supratentorial brain. 3. Small, chronic left basal ganglial lacunar infarct. Electronically Signed   By: Suzen Dials M.D.   On: 11/26/2024 13:00   DG Chest 1 View Result Date: 11/26/2024 EXAM: 1 VIEW(S) XRAY OF THE CHEST 11/26/2024 11:47:00 AM COMPARISON: 04/11/2024 CLINICAL HISTORY: 89 year old female with cough and fall. FINDINGS: LUNGS AND PLEURA: Pulmonary interstitial prominence is chronic and stable. Small nodular density in the peripheral left lower lobe appears to correspond to an area of curvilinear scarring on CT last year. No acute lung opacity. No pleural effusion. No pneumothorax. HEART AND MEDIASTINUM: Cardiomegaly. BONES AND SOFT TISSUES: Thoracic degenerative changes. No acute osseous abnormality. IMPRESSION: 1. Chronic cardiomegaly and lung scarring. No acute cardiopulmonary abnormality. Electronically signed by: Helayne Hurst MD 11/26/2024 12:03 PM EST RP Workstation: [id number]   DG Lumbar Spine Complete Result Date: 11/26/2024 EXAM: 4 VIEW(S) XRAY OF THE LUMBAR SPINE 11/26/2024 11:47:00 AM COMPARISON: CT abdomen and pelvis 09/19/2023. CLINICAL HISTORY:  89 year old female. Fall. FINDINGS: LUMBAR SPINE: BONES: Normal lumbar segmentation. Vertebral body heights are maintained. Alignment is normal. Stable lordosis. Stable vertebral height and disc spaces. Chronic generalized osteopenia. Previously intact visible lower thoracic levels. No acute osseous abnormality. DISCS AND DEGENERATIVE CHANGES: Chronic degenerative interbody ankylosis at the lumbosacral junction. No severe  degenerative changes. SOFT TISSUES: Advanced chronic calcified abdominal aortic atherosclerosis. Negative bowel gas pattern. No acute abnormality. IMPRESSION: 1. No acute osseous abnormality identified in the lumbar spine. 2. Advanced chronic calcified abdominal aortic atherosclerosis. Electronically signed by: Helayne Hurst MD 11/26/2024 11:59 AM EST RP Workstation: [id number]     Labs:   Basic Metabolic Panel: Recent Labs  Lab 11/26/24 1306 11/26/24 1525 11/26/24 1534 11/27/24 0652 11/28/24 0608  NA 139 137 137 137 136  K 4.9 4.1 3.7 3.9 3.5  CL  --  99 99 100 98  CO2  --  26  --  27 31  GLUCOSE  --  98 103* 102* 96  BUN  --  12 13 14 11   CREATININE  --  0.73 0.80 0.72 0.59  CALCIUM  --  8.9  --  9.2 8.7*  MG  --   --   --   --  1.9   GFR Estimated Creatinine Clearance: 33.9 mL/min (by C-G formula based on SCr of 0.59 mg/dL). Liver Function Tests: Recent Labs  Lab 11/26/24 1525  AST 27  ALT 15  ALKPHOS 48  BILITOT 0.4  PROT 7.0  ALBUMIN 3.8   No results for input(s): LIPASE, AMYLASE in the last 168 hours. No results for input(s): AMMONIA in the last 168 hours. Coagulation profile No results for input(s): INR, PROTIME in the last 168 hours.  CBC: Recent Labs  Lab 11/26/24 1250 11/26/24 1306 11/26/24 1534 11/27/24 0652 11/28/24 0608  WBC 8.2  --   --  6.6 3.3*  HGB 15.2* 15.3* 14.3 14.1 13.1  HCT 46.5* 45.0 42.0 42.8 40.1  MCV 94.5  --   --  94.3 92.4  PLT 228  --   --  172 189   Cardiac Enzymes: No results for input(s): CKTOTAL, CKMB, CKMBINDEX, TROPONINI in the last 168 hours. BNP: Invalid input(s): POCBNP CBG: No results for input(s): GLUCAP in the last 168 hours. D-Dimer No results for input(s): DDIMER in the last 72 hours. Hgb A1c No results for input(s): HGBA1C in the last 72 hours. Lipid Profile No results for input(s): CHOL, HDL, LDLCALC, TRIG, CHOLHDL, LDLDIRECT in the last 72 hours. Thyroid  function  studies No results for input(s): TSH, T4TOTAL, T3FREE, THYROIDAB in the last 72 hours.  Invalid input(s): FREET3 Anemia work up No results for input(s): VITAMINB12, FOLATE, FERRITIN, TIBC, IRON, RETICCTPCT in the last 72 hours. Microbiology Recent Results (from the past 240 hours)  Resp panel by RT-PCR (RSV, Flu A&B, Covid) Anterior Nasal Swab     Status: Abnormal   Collection Time: 11/26/24 11:16 AM   Specimen: Anterior Nasal Swab  Result Value Ref Range Status   SARS Coronavirus 2 by RT PCR NEGATIVE NEGATIVE Final   Influenza A by PCR POSITIVE (A) NEGATIVE Final   Influenza B by PCR NEGATIVE NEGATIVE Final    Comment: (NOTE) The Xpert Xpress SARS-CoV-2/FLU/RSV plus assay is intended as an aid in the diagnosis of influenza from Nasopharyngeal swab specimens and should not be used as a sole basis for treatment. Nasal washings and aspirates are unacceptable for Xpert Xpress SARS-CoV-2/FLU/RSV testing.  Fact Sheet for Patients: bloggercourse.com  Fact Sheet  for Healthcare Providers: seriousbroker.it  This test is not yet approved or cleared by the [place]  FDA and has been authorized for detection and/or diagnosis of SARS-CoV-2 by FDA under an Emergency Use Authorization (EUA). This EUA will remain in effect (meaning this test can be used) for the duration of the COVID-19 declaration under Section 564(b)(1) of the Act, 21 U.S.C. section 360bbb-3(b)(1), unless the authorization is terminated or revoked.     Resp Syncytial Virus by PCR NEGATIVE NEGATIVE Final    Comment: (NOTE) Fact Sheet for Patients: bloggercourse.com  Fact Sheet for Healthcare Providers: seriousbroker.it  This test is not yet approved or cleared by the [place]  FDA and has been authorized for detection and/or diagnosis of SARS-CoV-2 by FDA under an Emergency Use Authorization  (EUA). This EUA will remain in effect (meaning this test can be used) for the duration of the COVID-19 declaration under Section 564(b)(1) of the Act, 21 U.S.C. section 360bbb-3(b)(1), unless the authorization is terminated or revoked.  Performed at [facility], [address] [place], [place] [address]      Discharge Instructions:   Discharge Instructions     Diet general   Complete by: As directed    Discharge instructions   Complete by: As directed    Follow-up with your primary care provider in 1 week.  Check blood work at that time and continue to use your oxygen until reassessment with your primary care provider. Do not overexert.  Seek medical attention for worsening symptoms.   Increase activity slowly   Complete by: As directed       Allergies as of 11/28/2024       Reactions   Memantine  Other (See Comments)        Medication List     STOP taking these medications    metoprolol  succinate 25 MG 24 hr tablet Commonly known as: TOPROL -XL       TAKE these medications    acetaminophen  325 MG tablet Commonly known as: TYLENOL  Take 2 tablets (650 mg total) by mouth every 6 (six) hours as needed for mild pain, moderate pain, fever or headache (or Fever >/= 101).   albuterol  108 (90 Base) MCG/ACT inhaler Commonly known as: VENTOLIN  HFA Inhale 2 puffs into the lungs every 6 (six) hours as needed for wheezing or shortness of breath.   amLODipine  5 MG tablet Commonly known as: NORVASC  Take 5 mg by mouth daily.   arformoterol  15 MCG/2ML Nebu Commonly known as: BROVANA  Take 2 mLs (15 mcg total) by nebulization 2 (two) times daily. What changed:  when to take this reasons to take this   B-12 1000 MCG Tbcr Take 1 tablet by mouth daily.   CENTRUM ADULT PO Take 1 tablet by mouth daily at 6 (six) AM.   citalopram  20 MG tablet Commonly known as: CELEXA  Take 1 tablet (20 mg total) by mouth every morning.   COLACE PO Take by mouth. What changed:   how much to take when to take this   donepezil  10 MG tablet Commonly known as: ARICEPT  Take 10 mg by mouth at bedtime.   Eliquis  2.5 MG Tabs tablet Generic drug: apixaban  Take 2.5 mg by mouth 2 (two) times daily.   famotidine 20 MG tablet Commonly known as: PEPCID Take 20 mg by mouth daily.   ferrous sulfate  325 (65 FE) MG tablet Take 1 tablet (325 mg total) by mouth 2 (two) times daily with a meal. What changed: when to take this  guaiFENesin -dextromethorphan  100-10 MG/5ML syrup Commonly known as: ROBITUSSIN DM Take 5 mLs by mouth every 4 (four) hours as needed for cough (chest congestion).   latanoprost  0.005 % ophthalmic solution Commonly known as: XALATAN  Place 1 drop into both eyes every evening.   oseltamivir  30 MG capsule Commonly known as: TAMIFLU  Take 1 capsule (30 mg total) by mouth 2 (two) times daily.   pantoprazole  40 MG tablet Commonly known as: PROTONIX  Take 1 tablet (40 mg total) by mouth 2 (two) times daily.               Durable Medical Equipment  (From admission, onward)           Start     Ordered   11/28/24 1044  For home use only DME oxygen  Once       Question Answer Comment  Length of Need 12 Months   Mode or (Route) Nasal cannula   Liters per Minute 2   Frequency Continuous (stationary and portable oxygen unit needed)   Oxygen conserving device Yes   Oxygen delivery system: Gas   Oxygen delivery system: Concentrator      11/28/24 1044            Contact information for follow-up providers     Amanda Millman, MD Follow up.   Specialty: Family Medicine Why: follow up 7-10 days Contact information: [address] [place] [address] [phone]              Contact information for after-discharge care     Durable Medical Equipment     [facility] (DME) Follow up.   Service: Durable Medical Equipment Why: for your oxygen Contact information: [address] [place]  [address] [phone]             Home Medical Care     [facility]) .   Service: Home Health Services Contact information: [address] [place]  [phone]                      Time coordinating discharge: 39 minutes  Signed:  Mabell Esguerra  [facility] 11/28/2024, 3:07 PM          "

## 2024-11-28 NOTE — TOC Transition Note (Addendum)
 Transition of Care [facility]) - Discharge Note   Patient Details  Name: Amanda [name] MRN: [id number] Date of Birth: [birth year]  [facility] CM/SW Contact:  Corean JAYSON Canary, RN Phone Number: 11/28/2024, 11:05 AM   Clinical Narrative:     Patient will discharge with home health orders received [facility] accepted Patient requires oxygen delivery placed in HUB for transport tank and set up Discharge delay for DME delivery. Family wishes the patient to go home, Will have a increased risk of fall and readmission with addition of oxygen tubing.  Spoke with [name]. Discussed Home health and oxygen need Only responders on HUB was [facility] for home health and [facility] for oxygen.[facility] was on the way to deliver tank. Explained HH and reasonable expectation of visits as well as increased fall risk from oxygen. Ms Elnor states that she is taking FMLA for a while to care for the patien Gave her [facility] and [facility]'s number. A family member will be there to pick her up  Final next level of care: Home w Home Health Services Barriers to Discharge: ED DME delivery   Patient Goals and CMS Choice            Discharge Placement                       Discharge Plan and Services Additional resources added to the After Visit Summary for                                       Social Drivers of Health (SDOH) Interventions SDOH Screenings   Food Insecurity: No Food Insecurity (11/26/2024)  Housing: Low Risk (11/26/2024)  Transportation Needs: No Transportation Needs (11/26/2024)  Utilities: Not At Risk (11/26/2024)  Depression (PHQ2-9): Low Risk (09/15/2024)  Financial Resource Strain: Low Risk (01/17/2023)  Social Connections: Moderately Integrated (11/26/2024)  Tobacco Use: Medium Risk (11/26/2024)     Readmission Risk Interventions     No data to display

## 2024-12-04 ENCOUNTER — Telehealth: Payer: Self-pay

## 2024-12-04 NOTE — Telephone Encounter (Signed)
 Found out from [facility] that they do not have aide or social work services at this time. Called the office back to let them know a new referral with PT OT aide and SW need to go uot to new home health agencies. Arland from [facility] [employer] [facility] and will message Dr Stephany to resend order to different agency. Called Ms Ezzard back and informed her of the change. [facility] will cancel the referral on their end

## 2024-12-04 NOTE — Telephone Encounter (Signed)
 Ms Ezzard called back to say  That [facility] called and only offered PT and OT.  Had discussed with MD prior to DC to order PT OT aide, and social work. Ms ewis said [facility] who was the only one to accept the patient, stated they would only do PT and OT .  Called  PCP office , Dr Rolinda. Spoke to [place] [facility] Moishe assistant about this issue and the need to add on aide and social work, cannot see for chart if orders were correct  Spoke to Ms Ezzard who stated that she had talked to [facility] but declined services at this time stating the orders were not correct.  This RNCM informed her that the PCP would have to handle from here on out and to reach out to them. I will reach out to our liaison to see where the communication dropped in regards to orders.

## 2025-01-02 ENCOUNTER — Ambulatory Visit: Admitting: Podiatry

## 2025-03-24 ENCOUNTER — Ambulatory Visit: Admitting: Podiatry

## 2025-05-11 ENCOUNTER — Inpatient Hospital Stay: Admitting: Oncology

## 2025-05-11 ENCOUNTER — Inpatient Hospital Stay

## 2025-06-01 ENCOUNTER — Ambulatory Visit: Admitting: Adult Health
